# Patient Record
Sex: Female | Born: 1950 | Race: White | Hispanic: No | Marital: Single | Smoking: Never smoker
Health system: Southern US, Community
[De-identification: ages and names within clinical notes are randomized; demographics above are authoritative.]

## PROBLEM LIST (undated history)

## (undated) DIAGNOSIS — E669 Obesity, unspecified: Secondary | ICD-10-CM

## (undated) DIAGNOSIS — I700 Atherosclerosis of aorta: Secondary | ICD-10-CM

## (undated) DIAGNOSIS — Z8709 Personal history of other diseases of the respiratory system: Secondary | ICD-10-CM

## (undated) DIAGNOSIS — K429 Umbilical hernia without obstruction or gangrene: Secondary | ICD-10-CM

## (undated) DIAGNOSIS — K648 Other hemorrhoids: Secondary | ICD-10-CM

## (undated) DIAGNOSIS — H269 Unspecified cataract: Secondary | ICD-10-CM

## (undated) DIAGNOSIS — M51369 Other intervertebral disc degeneration, lumbar region without mention of lumbar back pain or lower extremity pain: Secondary | ICD-10-CM

## (undated) DIAGNOSIS — J45909 Unspecified asthma, uncomplicated: Secondary | ICD-10-CM

## (undated) DIAGNOSIS — F32A Depression, unspecified: Secondary | ICD-10-CM

## (undated) DIAGNOSIS — C73 Malignant neoplasm of thyroid gland: Secondary | ICD-10-CM

## (undated) DIAGNOSIS — K219 Gastro-esophageal reflux disease without esophagitis: Secondary | ICD-10-CM

## (undated) DIAGNOSIS — R42 Dizziness and giddiness: Secondary | ICD-10-CM

## (undated) DIAGNOSIS — M26609 Unspecified temporomandibular joint disorder, unspecified side: Secondary | ICD-10-CM

## (undated) DIAGNOSIS — M609 Myositis, unspecified: Secondary | ICD-10-CM

## (undated) DIAGNOSIS — F419 Anxiety disorder, unspecified: Secondary | ICD-10-CM

## (undated) DIAGNOSIS — I10 Essential (primary) hypertension: Secondary | ICD-10-CM

## (undated) DIAGNOSIS — F329 Major depressive disorder, single episode, unspecified: Secondary | ICD-10-CM

## (undated) DIAGNOSIS — M5136 Other intervertebral disc degeneration, lumbar region: Secondary | ICD-10-CM

## (undated) DIAGNOSIS — K649 Unspecified hemorrhoids: Secondary | ICD-10-CM

## (undated) DIAGNOSIS — M1990 Unspecified osteoarthritis, unspecified site: Secondary | ICD-10-CM

## (undated) DIAGNOSIS — J302 Other seasonal allergic rhinitis: Secondary | ICD-10-CM

## (undated) DIAGNOSIS — K5730 Diverticulosis of large intestine without perforation or abscess without bleeding: Secondary | ICD-10-CM

## (undated) DIAGNOSIS — E785 Hyperlipidemia, unspecified: Secondary | ICD-10-CM

## (undated) DIAGNOSIS — E039 Hypothyroidism, unspecified: Secondary | ICD-10-CM

## (undated) DIAGNOSIS — K5010 Crohn's disease of large intestine without complications: Secondary | ICD-10-CM

## (undated) DIAGNOSIS — K5792 Diverticulitis of intestine, part unspecified, without perforation or abscess without bleeding: Secondary | ICD-10-CM

## (undated) DIAGNOSIS — K8020 Calculus of gallbladder without cholecystitis without obstruction: Secondary | ICD-10-CM

## (undated) DIAGNOSIS — D126 Benign neoplasm of colon, unspecified: Secondary | ICD-10-CM

## (undated) HISTORY — DX: Other intervertebral disc degeneration, lumbar region: M51.36

## (undated) HISTORY — PX: TUBAL LIGATION: SHX77

## (undated) HISTORY — PX: HEMORRHOID SURGERY: SHX153

## (undated) HISTORY — DX: Hyperlipidemia, unspecified: E78.5

## (undated) HISTORY — DX: Crohn's disease of large intestine without complications: K50.10

## (undated) HISTORY — PX: RECTOCELE REPAIR: SHX761

## (undated) HISTORY — PX: THYROIDECTOMY, PARTIAL: SHX18

## (undated) HISTORY — DX: Benign neoplasm of colon, unspecified: D12.6

## (undated) HISTORY — PX: ABDOMINAL HYSTERECTOMY: SHX81

## (undated) HISTORY — PX: HEMORRHOID BANDING: SHX5850

## (undated) HISTORY — PX: HAND NEUROPLASTY: SHX1729

## (undated) HISTORY — DX: Diverticulitis of intestine, part unspecified, without perforation or abscess without bleeding: K57.92

## (undated) HISTORY — DX: Other hemorrhoids: K64.8

## (undated) HISTORY — PX: HEMORROIDECTOMY: SUR656

## (undated) HISTORY — PX: UPPER GI ENDOSCOPY: SHX6162

## (undated) HISTORY — DX: Malignant neoplasm of thyroid gland: C73

## (undated) HISTORY — PX: APPENDECTOMY: SHX54

## (undated) HISTORY — DX: Other intervertebral disc degeneration, lumbar region without mention of lumbar back pain or lower extremity pain: M51.369

## (undated) HISTORY — DX: Unspecified hemorrhoids: K64.9

## (undated) HISTORY — DX: Diverticulosis of large intestine without perforation or abscess without bleeding: K57.30

## (undated) HISTORY — DX: Hypothyroidism, unspecified: E03.9

---

## 1997-09-24 ENCOUNTER — Ambulatory Visit (HOSPITAL_COMMUNITY): Admission: RE | Admit: 1997-09-24 | Discharge: 1997-09-24 | Payer: Self-pay | Admitting: Obstetrics & Gynecology

## 1997-10-01 ENCOUNTER — Ambulatory Visit (HOSPITAL_COMMUNITY): Admission: RE | Admit: 1997-10-01 | Discharge: 1997-10-01 | Payer: Self-pay | Admitting: Obstetrics & Gynecology

## 1998-07-23 ENCOUNTER — Encounter: Payer: Self-pay | Admitting: Obstetrics & Gynecology

## 1998-07-28 ENCOUNTER — Inpatient Hospital Stay (HOSPITAL_COMMUNITY): Admission: RE | Admit: 1998-07-28 | Discharge: 1998-07-30 | Payer: Self-pay | Admitting: Obstetrics & Gynecology

## 1999-08-10 ENCOUNTER — Encounter: Payer: Self-pay | Admitting: Family Medicine

## 1999-08-10 ENCOUNTER — Encounter: Admission: RE | Admit: 1999-08-10 | Discharge: 1999-08-10 | Payer: Self-pay | Admitting: Family Medicine

## 1999-08-18 ENCOUNTER — Other Ambulatory Visit: Admission: RE | Admit: 1999-08-18 | Discharge: 1999-08-18 | Payer: Self-pay | Admitting: General Surgery

## 1999-09-08 ENCOUNTER — Inpatient Hospital Stay (HOSPITAL_COMMUNITY): Admission: RE | Admit: 1999-09-08 | Discharge: 1999-09-09 | Payer: Self-pay | Admitting: General Surgery

## 1999-09-08 ENCOUNTER — Encounter (INDEPENDENT_AMBULATORY_CARE_PROVIDER_SITE_OTHER): Payer: Self-pay | Admitting: Specialist

## 2000-01-01 ENCOUNTER — Other Ambulatory Visit: Admission: RE | Admit: 2000-01-01 | Discharge: 2000-01-01 | Payer: Self-pay | Admitting: Obstetrics & Gynecology

## 2000-01-20 ENCOUNTER — Other Ambulatory Visit: Admission: RE | Admit: 2000-01-20 | Discharge: 2000-01-20 | Payer: Self-pay | Admitting: Internal Medicine

## 2000-01-20 ENCOUNTER — Encounter (INDEPENDENT_AMBULATORY_CARE_PROVIDER_SITE_OTHER): Payer: Self-pay

## 2000-01-20 DIAGNOSIS — D126 Benign neoplasm of colon, unspecified: Secondary | ICD-10-CM

## 2000-01-20 HISTORY — DX: Benign neoplasm of colon, unspecified: D12.6

## 2000-01-22 ENCOUNTER — Encounter: Payer: Self-pay | Admitting: General Surgery

## 2000-01-22 ENCOUNTER — Encounter: Admission: RE | Admit: 2000-01-22 | Discharge: 2000-01-22 | Payer: Self-pay | Admitting: General Surgery

## 2000-02-15 ENCOUNTER — Other Ambulatory Visit: Admission: RE | Admit: 2000-02-15 | Discharge: 2000-02-15 | Payer: Self-pay | Admitting: Urology

## 2000-02-24 ENCOUNTER — Other Ambulatory Visit: Admission: RE | Admit: 2000-02-24 | Discharge: 2000-02-24 | Payer: Self-pay | Admitting: Urology

## 2000-04-13 ENCOUNTER — Encounter: Payer: Self-pay | Admitting: General Surgery

## 2000-04-13 ENCOUNTER — Ambulatory Visit (HOSPITAL_COMMUNITY): Admission: RE | Admit: 2000-04-13 | Discharge: 2000-04-13 | Payer: Self-pay | Admitting: General Surgery

## 2000-07-20 ENCOUNTER — Encounter: Payer: Self-pay | Admitting: General Surgery

## 2000-07-20 ENCOUNTER — Encounter: Admission: RE | Admit: 2000-07-20 | Discharge: 2000-07-20 | Payer: Self-pay | Admitting: General Surgery

## 2001-01-23 ENCOUNTER — Other Ambulatory Visit: Admission: RE | Admit: 2001-01-23 | Discharge: 2001-01-23 | Payer: Self-pay | Admitting: Obstetrics & Gynecology

## 2001-07-12 ENCOUNTER — Encounter: Admission: RE | Admit: 2001-07-12 | Discharge: 2001-07-12 | Payer: Self-pay | Admitting: Family Medicine

## 2001-07-12 ENCOUNTER — Encounter: Payer: Self-pay | Admitting: Family Medicine

## 2001-07-13 ENCOUNTER — Encounter: Payer: Self-pay | Admitting: Gastroenterology

## 2001-07-13 ENCOUNTER — Inpatient Hospital Stay (HOSPITAL_COMMUNITY): Admission: EM | Admit: 2001-07-13 | Discharge: 2001-07-19 | Payer: Self-pay | Admitting: Emergency Medicine

## 2001-07-19 ENCOUNTER — Encounter: Payer: Self-pay | Admitting: Gastroenterology

## 2001-08-03 ENCOUNTER — Encounter: Payer: Self-pay | Admitting: Internal Medicine

## 2001-08-03 ENCOUNTER — Ambulatory Visit (HOSPITAL_COMMUNITY): Admission: RE | Admit: 2001-08-03 | Discharge: 2001-08-03 | Payer: Self-pay | Admitting: Internal Medicine

## 2002-02-28 ENCOUNTER — Other Ambulatory Visit: Admission: RE | Admit: 2002-02-28 | Discharge: 2002-02-28 | Payer: Self-pay | Admitting: Obstetrics & Gynecology

## 2002-03-23 ENCOUNTER — Encounter: Admission: RE | Admit: 2002-03-23 | Discharge: 2002-03-23 | Payer: Self-pay | Admitting: Family Medicine

## 2002-03-23 ENCOUNTER — Encounter: Payer: Self-pay | Admitting: Family Medicine

## 2002-04-18 ENCOUNTER — Encounter: Admission: RE | Admit: 2002-04-18 | Discharge: 2002-04-18 | Payer: Self-pay | Admitting: General Surgery

## 2002-04-18 ENCOUNTER — Encounter: Payer: Self-pay | Admitting: General Surgery

## 2002-04-18 ENCOUNTER — Inpatient Hospital Stay (HOSPITAL_COMMUNITY): Admission: EM | Admit: 2002-04-18 | Discharge: 2002-04-20 | Payer: Self-pay | Admitting: Emergency Medicine

## 2002-04-19 ENCOUNTER — Encounter (INDEPENDENT_AMBULATORY_CARE_PROVIDER_SITE_OTHER): Payer: Self-pay | Admitting: Specialist

## 2002-04-19 ENCOUNTER — Encounter: Payer: Self-pay | Admitting: Surgery

## 2002-10-22 ENCOUNTER — Encounter: Payer: Self-pay | Admitting: General Surgery

## 2002-10-22 ENCOUNTER — Encounter: Admission: RE | Admit: 2002-10-22 | Discharge: 2002-10-22 | Payer: Self-pay | Admitting: General Surgery

## 2003-02-25 ENCOUNTER — Other Ambulatory Visit: Admission: RE | Admit: 2003-02-25 | Discharge: 2003-02-25 | Payer: Self-pay | Admitting: Obstetrics & Gynecology

## 2004-03-31 ENCOUNTER — Ambulatory Visit (HOSPITAL_COMMUNITY): Admission: RE | Admit: 2004-03-31 | Discharge: 2004-03-31 | Payer: Self-pay | Admitting: General Surgery

## 2004-05-07 ENCOUNTER — Observation Stay (HOSPITAL_COMMUNITY): Admission: RE | Admit: 2004-05-07 | Discharge: 2004-05-08 | Payer: Self-pay | Admitting: Obstetrics & Gynecology

## 2004-08-04 ENCOUNTER — Other Ambulatory Visit: Admission: RE | Admit: 2004-08-04 | Discharge: 2004-08-04 | Payer: Self-pay | Admitting: Obstetrics & Gynecology

## 2004-10-12 ENCOUNTER — Ambulatory Visit: Payer: Self-pay | Admitting: Internal Medicine

## 2004-10-13 ENCOUNTER — Encounter: Admission: RE | Admit: 2004-10-13 | Discharge: 2004-10-13 | Payer: Self-pay | Admitting: Surgery

## 2004-10-28 ENCOUNTER — Ambulatory Visit: Payer: Self-pay | Admitting: Internal Medicine

## 2005-08-11 ENCOUNTER — Other Ambulatory Visit: Admission: RE | Admit: 2005-08-11 | Discharge: 2005-08-11 | Payer: Self-pay | Admitting: Obstetrics & Gynecology

## 2005-08-31 ENCOUNTER — Encounter: Admission: RE | Admit: 2005-08-31 | Discharge: 2005-08-31 | Payer: Self-pay | Admitting: Obstetrics & Gynecology

## 2006-04-28 ENCOUNTER — Encounter: Admission: RE | Admit: 2006-04-28 | Discharge: 2006-07-27 | Payer: Self-pay | Admitting: Family Medicine

## 2006-10-27 ENCOUNTER — Encounter: Admission: RE | Admit: 2006-10-27 | Discharge: 2006-10-27 | Payer: Self-pay | Admitting: Obstetrics & Gynecology

## 2007-04-12 ENCOUNTER — Encounter: Admission: RE | Admit: 2007-04-12 | Discharge: 2007-04-12 | Payer: Self-pay | Admitting: General Surgery

## 2007-05-16 ENCOUNTER — Ambulatory Visit: Payer: Self-pay | Admitting: Cardiovascular Disease

## 2007-06-26 ENCOUNTER — Ambulatory Visit: Payer: Self-pay

## 2007-06-27 ENCOUNTER — Ambulatory Visit: Payer: Self-pay | Admitting: Cardiovascular Disease

## 2007-06-27 LAB — CONVERTED CEMR LAB
AST: 21 units/L (ref 0–37)
Albumin: 3.8 g/dL (ref 3.5–5.2)
Alkaline Phosphatase: 39 units/L (ref 39–117)
Cholesterol: 161 mg/dL (ref 0–200)
HDL: 35.6 mg/dL — ABNORMAL LOW (ref >39.0)
Total Bilirubin: 0.9 mg/dL (ref 0.3–1.2)
Total CHOL/HDL Ratio: 4.5
Triglycerides: 168 mg/dL — ABNORMAL HIGH (ref 0–149)

## 2007-10-30 ENCOUNTER — Encounter: Admission: RE | Admit: 2007-10-30 | Discharge: 2007-10-30 | Payer: Self-pay | Admitting: Obstetrics & Gynecology

## 2008-06-03 ENCOUNTER — Ambulatory Visit (HOSPITAL_COMMUNITY): Admission: RE | Admit: 2008-06-03 | Discharge: 2008-06-03 | Payer: Self-pay | Admitting: Specialist

## 2008-11-28 ENCOUNTER — Emergency Department (HOSPITAL_COMMUNITY): Admission: EM | Admit: 2008-11-28 | Discharge: 2008-11-28 | Payer: Self-pay | Admitting: Emergency Medicine

## 2009-05-21 ENCOUNTER — Telehealth: Payer: Self-pay | Admitting: Internal Medicine

## 2009-05-22 ENCOUNTER — Telehealth (INDEPENDENT_AMBULATORY_CARE_PROVIDER_SITE_OTHER): Payer: Self-pay | Admitting: *Deleted

## 2009-06-02 ENCOUNTER — Ambulatory Visit (HOSPITAL_COMMUNITY): Admission: RE | Admit: 2009-06-02 | Discharge: 2009-06-02 | Payer: Self-pay | Admitting: Family Medicine

## 2009-07-14 ENCOUNTER — Telehealth: Payer: Self-pay | Admitting: Internal Medicine

## 2009-07-15 ENCOUNTER — Encounter: Payer: Self-pay | Admitting: Gastroenterology

## 2009-08-01 DIAGNOSIS — E039 Hypothyroidism, unspecified: Secondary | ICD-10-CM | POA: Insufficient documentation

## 2009-08-01 DIAGNOSIS — F341 Dysthymic disorder: Secondary | ICD-10-CM | POA: Insufficient documentation

## 2009-08-01 DIAGNOSIS — I10 Essential (primary) hypertension: Secondary | ICD-10-CM | POA: Insufficient documentation

## 2009-08-01 DIAGNOSIS — E7800 Pure hypercholesterolemia, unspecified: Secondary | ICD-10-CM

## 2009-08-05 ENCOUNTER — Ambulatory Visit: Payer: Self-pay | Admitting: Gastroenterology

## 2009-08-05 DIAGNOSIS — D126 Benign neoplasm of colon, unspecified: Secondary | ICD-10-CM

## 2009-08-05 DIAGNOSIS — R1084 Generalized abdominal pain: Secondary | ICD-10-CM

## 2009-08-05 DIAGNOSIS — K5730 Diverticulosis of large intestine without perforation or abscess without bleeding: Secondary | ICD-10-CM | POA: Insufficient documentation

## 2009-08-05 LAB — CONVERTED CEMR LAB
Amylase: 43 units/L (ref 27–131)
IgA: 413 mg/dL — ABNORMAL HIGH (ref 68–378)
Lipase: 17 units/L (ref 11.0–59.0)
Sed Rate: 39 mm/hr — ABNORMAL HIGH (ref 0–22)
Tissue Transglutaminase Ab, IgA: 0.9 units (ref <7)

## 2009-08-26 ENCOUNTER — Ambulatory Visit: Payer: Self-pay | Admitting: Gastroenterology

## 2009-08-26 ENCOUNTER — Encounter (INDEPENDENT_AMBULATORY_CARE_PROVIDER_SITE_OTHER): Payer: Self-pay | Admitting: *Deleted

## 2009-08-26 DIAGNOSIS — R1012 Left upper quadrant pain: Secondary | ICD-10-CM

## 2009-08-26 LAB — CONVERTED CEMR LAB
AST: 18 units/L (ref 0–37)
Albumin: 4 g/dL (ref 3.5–5.2)
CO2: 30 meq/L (ref 19–32)
Ferritin: 94.4 ng/mL (ref 10.0–291.0)
Folate: 7.7 ng/mL
Glucose, Bld: 88 mg/dL (ref 70–99)
Iron: 55 ug/dL (ref 42–145)
Lymphocytes Relative: 29 % (ref 12.0–46.0)
MCHC: 32.6 g/dL (ref 30.0–36.0)
MCV: 90.7 fL (ref 78.0–100.0)
Neutrophils Relative %: 63.4 % (ref 43.0–77.0)
Potassium: 3.6 meq/L (ref 3.5–5.1)
RDW: 11.5 % (ref 11.5–14.6)
Sed Rate: 43 mm/hr — ABNORMAL HIGH (ref 0–22)
Sodium: 139 meq/L (ref 135–145)
TSH: 2.13 microintl units/mL (ref 0.35–5.50)
Total Protein: 8 g/dL (ref 6.0–8.3)
Vitamin B-12: 346 pg/mL (ref 211–911)

## 2009-08-27 ENCOUNTER — Telehealth: Payer: Self-pay | Admitting: Gastroenterology

## 2009-09-10 ENCOUNTER — Ambulatory Visit (HOSPITAL_COMMUNITY)
Admission: RE | Admit: 2009-09-10 | Discharge: 2009-09-10 | Payer: Self-pay | Source: Home / Self Care | Admitting: Gastroenterology

## 2009-09-10 ENCOUNTER — Ambulatory Visit: Payer: Self-pay | Admitting: Gastroenterology

## 2009-09-10 DIAGNOSIS — K219 Gastro-esophageal reflux disease without esophagitis: Secondary | ICD-10-CM

## 2009-09-11 LAB — CONVERTED CEMR LAB: UREASE: NEGATIVE

## 2009-09-12 ENCOUNTER — Encounter: Payer: Self-pay | Admitting: Gastroenterology

## 2009-10-28 ENCOUNTER — Encounter (INDEPENDENT_AMBULATORY_CARE_PROVIDER_SITE_OTHER): Payer: Self-pay | Admitting: *Deleted

## 2009-10-28 ENCOUNTER — Ambulatory Visit: Payer: Self-pay | Admitting: Gastroenterology

## 2009-11-05 ENCOUNTER — Ambulatory Visit: Payer: Self-pay | Admitting: Gastroenterology

## 2010-07-26 ENCOUNTER — Encounter: Payer: Self-pay | Admitting: Surgery

## 2010-07-27 ENCOUNTER — Encounter: Payer: Self-pay | Admitting: Orthopedic Surgery

## 2010-07-28 ENCOUNTER — Encounter: Payer: Self-pay | Admitting: Gastroenterology

## 2010-08-06 NOTE — Letter (Signed)
Summary: EGD Instructions  [facility]  [address] [place], [place] [address]   Phone: [phone]  Fax: [phone]       Linda Wang    01/30/1951    MRN: [id number]       Procedure Day Dorna Bloom: Wednesday, 09/10/09     Arrival Time:  10:00     Procedure Time: 11:00     Location of Procedure:                    _X  _ [facility] (4th Floor)     PREPARATION FOR ENDOSCOPY   On 09/10/09 THE DAY OF THE PROCEDURE:  1.   No solid foods, milk or milk products are allowed after midnight the night before your procedure.  2.   Do not drink anything colored red or purple.  Avoid juices with pulp.  No orange juice.  3.  You may drink clear liquids until 9:00, which is 2 hours before your procedure.                                                                                                CLEAR LIQUIDS INCLUDE: Water Jello Ice Popsicles Tea (sugar ok, no milk/cream) Powdered fruit flavored drinks Coffee (sugar ok, no milk/cream) Gatorade Juice: apple, white grape, white cranberry  Lemonade Clear bullion, consomm, broth Carbonated beverages (any kind) Strained chicken noodle soup Hard Candy   MEDICATION INSTRUCTIONS  Unless otherwise instructed, you should take regular prescription medications with a small sip of water as early as possible the morning of your procedure.                       OTHER INSTRUCTIONS  You will need a responsible adult at least 60 years of age to accompany you and drive you home.   This person must remain in the waiting room during your procedure.  Wear loose fitting clothing that is easily removed.  Leave jewelry and other valuables at home.  However, you may wish to bring a book to read or an iPod/MP3 player to listen to music as you wait for your procedure to start.  Remove all body piercing jewelry and leave at home.  Total time from sign-in until discharge is approximately 2-3 hours.  You should  go home directly after your procedure and rest.  You can resume normal activities the day after your procedure.  The day of your procedure you should not:   Drive   Make legal decisions   Operate machinery   Drink alcohol   Return to work  You will receive specific instructions about eating, activities and medications before you leave.    The above instructions have been reviewed and explained to me by   _______________________    I fully understand and can verbalize these instructions _____________________________ Date _________

## 2010-08-06 NOTE — Miscellaneous (Signed)
Summary: Clotest  Clinical Lists Changes  Orders: Added new Test order of TLB-H Pylori Screen Gastric Biopsy (83013-CLOTEST) - Signed 

## 2010-08-06 NOTE — Progress Notes (Signed)
Summary: change meds  Medications Added HYOSCYAMINE SULFATE 0.125 MG TABS (HYOSCYAMINE SULFATE) 1 q 4-6 hrs as needed       Phone Note Call from Patient Call back at [phone]   Caller: Patient Call For: Dr. Jarold Motto Reason for Call: Talk to Nurse Summary of Call: pt would like the generic of the med that was called infor her yesterday... pt doesnt recall name, but said that it was for stomach cramping... Initial call taken by: Vallarie Mare,  August 27, 2009 3:20 PM  Follow-up for Phone Call        Pt notified. Follow-up by: Ashok Cordia RN,  August 27, 2009 3:55 PM    New/Updated Medications: HYOSCYAMINE SULFATE 0.125 MG TABS (HYOSCYAMINE SULFATE) 1 q 4-6 hrs as needed Prescriptions: HYOSCYAMINE SULFATE 0.125 MG TABS (HYOSCYAMINE SULFATE) 1 q 4-6 hrs as needed  #40 x 3   Entered by:   Ashok Cordia RN   Authorized by:   Mardella Layman MD [facility]   Signed by:   Ashok Cordia RN on 08/27/2009   Method used:   Electronically to        [facility] [address] (retail)       [phone]       [place], [place]  [address]       Ph: [phone]       Fax: [phone]   RxID:   [id number]   Appended Document: change meds Pt request Rx be sent to [facility] on [address].   Clinical Lists Changes  Medications: Rx of HYOSCYAMINE SULFATE 0.125 MG TABS (HYOSCYAMINE SULFATE) 1 q 4-6 hrs as needed;  #40 x 3;  Signed;  Entered by: Ashok Cordia RN;  Authorized by: Mardella Layman MD [facility];  Method used: Electronically to [facility]*, [address] [place], [place], [place]  [address], Ph: [phone], Fax: [phone]    Prescriptions: HYOSCYAMINE SULFATE 0.125 MG TABS (HYOSCYAMINE SULFATE) 1 q 4-6 hrs as needed  #40 x 3   Entered by:   Ashok Cordia RN   Authorized by:   Mardella Layman MD [facility]   Signed by:   Ashok Cordia RN on 08/28/2009   Method used:   Electronically to        [facility]*  (retail)       [address] [place], [place]  [address]       Ph: [phone]       Fax: [phone]   RxID:   [id number]

## 2010-08-06 NOTE — Assessment & Plan Note (Signed)
Summary: Post proc, 6 weeks/dfs    History of Present Illness Visit Type: Follow-up Visit Primary GI MD: Sheryn Bison MD FACP FAGA Primary Provider: [name], MD Chief Complaint: F/u from Endo and [place]. Patient denies any current GI complaints.  History of Present Illness:   Self imposed dietary restrictions have alleviated all of her GI complaints. She specifically denies left upper quadrant pain or reflux symptoms. She took AcipHex for 2 weeks has been off of this for the last month.   GI Review of Systems      Denies abdominal pain, acid reflux, belching, bloating, chest pain, dysphagia with liquids, dysphagia with solids, heartburn, loss of appetite, nausea, vomiting, vomiting blood, weight loss, and  weight gain.        Denies anal fissure, black tarry stools, change in bowel habit, constipation, diarrhea, diverticulosis, fecal incontinence, heme positive stool, hemorrhoids, irritable bowel syndrome, jaundice, light color stool, liver problems, rectal bleeding, and  rectal pain.    Current Medications (verified): 1)  Clearlax  Powd (Polyethylene Glycol 3350) .... Take 1 Dose Daily 2)  Levothroid 125 Mcg Tabs (Levothyroxine Sodium) .... Take 3 Tablets Weekly 3)  Vivelle-Dot 0.075 Mg/24hr Pttw (Estradiol) .... Change Patch Every 3-4 Days 4)  Alprazolam 0.5 Mg Tabs (Alprazolam) .... Take 1 Tablet By Mouth Once A Day As Needed 5)  Simvastatin 40 Mg Tabs (Simvastatin) .... Take 1 Tablet By Mouth Once A Day  Allergies (verified): 1)  Sulfa  Past History:  Past medical, surgical, family and social histories (including risk factors) reviewed for relevance to current acute and chronic problems.  Past Medical History: Reviewed history from 08/01/2009 and no changes required. Current Problems:  HYPOTHYROIDISM (ICD-244.9) ANXIETY DEPRESSION (ICD-300.4) HYPERTENSION (ICD-401.9) HYPERCHOLESTEROLEMIA (ICD-272.0)  Past Surgical  History: Appendectomy Hemorrhoidectomy Hysterectomy Thyroid Surgery Tubal Ligation C Section Laparoscopic rectocele surgery  Family History: Reviewed history from 08/05/2009 and no changes required. 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  Alcohol Use - no Illicit Drug Use - no  Review of Systems       The patient complains of allergy/sinus.  The patient denies anemia, anxiety-new, arthritis/joint pain, back pain, blood in urine, breast changes/lumps, change in vision, confusion, cough, coughing up blood, depression-new, fainting, fatigue, fever, headaches-new, hearing problems, heart murmur, heart rhythm changes, itching, menstrual pain, muscle pains/cramps, night sweats, nosebleeds, pregnancy symptoms, shortness of breath, skin rash, sleeping problems, sore throat, swelling of feet/legs, swollen lymph glands, thirst - excessive , urination - excessive , urination changes/pain, urine leakage, vision changes, and voice change.    Vital Signs:  Patient profile:   60 year old female Height:      62 inches Weight:      159.50 pounds BMI:     29.28 BSA:     1.74 Pulse rate:   84 / minute Pulse rhythm:   regular BP sitting:   144 / 94  (left arm)  Vitals Entered By: Lamona Curl CMA Duncan Dull) (October 28, 2009 3:03 PM)  Physical Exam  General:  Well developed, well nourished, no acute distress. Head:  Normocephalic and atraumatic. Eyes:  PERRLA, no icterus.exam deferred to patient's ophthalmologist.   Abdomen:  Soft, nontender and nondistended. No masses, hepatosplenomegaly or hernias noted. Normal bowel  sounds. Psych:  Alert and cooperative. Normal mood and affect.   Impression &  Recommendations:  Problem # 1:  ABDOMINAL PAIN, GENERALIZED (ICD-789.07) Assessment Improved  Problem # 2:  ADENOMATOUS COLONIC POLYP (ICD-211.3) Assessment: Unchanged Followup colonoscopy scheduled her convenience. It has been 5 years since her last exam and she does have a history of adenomatous polyps and a positive family history of colon cancer. She currently symptomatic in terms of any gastrointestinal abnormalities.  Patient Instructions: 1)  Copy sent to : Dr. [name] 2)  Please continue current medications.  3)  Colonoscopy and Flexible Sigmoidoscopy brochure given.  4)  Conscious Sedation brochure given.   Appended Document: Post proc, 6 weeks/dfs    Clinical Lists Changes  Medications: Added new medication of MOVIPREP 100 GM  SOLR (PEG-KCL-NACL-NASULF-NA ASC-C) As per prep instructions. - Signed Rx of MOVIPREP 100 GM  SOLR (PEG-KCL-NACL-NASULF-NA ASC-C) As per prep instructions.;  #1 x 0;  Signed;  Entered by: Ashok Cordia RN;  Authorized by: Mardella Layman MD [facility];  Method used: Electronically to [facility] [address] [phone] [place], [place], [place]  [address], Ph: [phone], Fax: [phone] Orders: Added new Test order of Colonoscopy (Colon) - Signed    Prescriptions: MOVIPREP 100 GM  SOLR (PEG-KCL-NACL-NASULF-NA ASC-C) As per prep instructions.  #1 x 0   Entered by:   Ashok Cordia RN   Authorized by:   Mardella Layman MD [facility]   Signed by:   Ashok Cordia RN on 10/28/2009   Method used:   Electronically to        [facility] [address] (retail)       [phone]       [place], [place]  [address]       Ph: [phone]       Fax: [phone]   RxID:   [id number]

## 2010-08-06 NOTE — Procedures (Signed)
Summary: Colonoscopy  Patient: Linda Wang Note: All result statuses are Final unless otherwise noted.  Tests: (1) Colonoscopy (COL)   COL Colonoscopy           DONE     [facility]     [address]     [place], [place]  [address]           COLONOSCOPY PROCEDURE REPORT           PATIENT:  Linda, Wang  MR#:  [id number]     BIRTHDATE:  04/13/1951, 58 yrs. old  GENDER:  female     ENDOSCOPIST:  Vania Rea. Jarold Motto, MD, [facility]     REF. BY:     PROCEDURE DATE:  11/05/2009     PROCEDURE:  Higher-risk screening colonoscopy G0105           ASA CLASS:  Class II     INDICATIONS:  family history of colon cancer, history of     pre-cancerous (adenomatous) colon polyps     MEDICATIONS:   Fentanyl 75 mcg IV, Versed 8 mg IV           DESCRIPTION OF PROCEDURE:   After the risks benefits and     alternatives of the procedure were thoroughly explained, informed     consent was obtained.  Digital rectal exam was performed and     revealed tender.   The LB CF-H180AL [id number] endoscope was     introduced through the anus and advanced to the cecum, which was     identified by both the appendix and ileocecal valve, without     limitations.  The quality of the prep was good, using MoviPrep.     The instrument was then slowly withdrawn as the colon was fully     examined.<<PROCEDUREIMAGES>>           FINDINGS:  Severe diverticulosis was found in the sigmoid to     descending colon segments. THICKENED,RED HAUSTRAL FOLDS NOTED.  No     polyps or cancers were seen.  Internal and external hemorrhoids     were found. SEE PICTURES.   Retroflexed views in the rectum     revealed internal and external hemorrhoids.    The scope was then     withdrawn from the patient and the procedure completed.           COMPLICATIONS:  None     ENDOSCOPIC IMPRESSION:     1) Severe diverticulosis in the sigmoid to descending colon     segments     2) No polyps or cancers     3) Internal and external hemorrhoids  RECOMMENDATIONS:     1) high fiber diet     2) Given your significant family history of colon cancer, you     should have a repeat colonoscopy in 5 years     REPEAT EXAM:  No           ______________________________     Vania Rea. Jarold Motto, MD, [facility]           CC:  [name], MD           n.     Rosalie DoctorMarland Kitchen   Vania Rea. Patterson at 11/05/2009 03:08 PM           [place], [place], [id number]  Note: An exclamation mark (!) indicates a result that was not dispersed into the flowsheet. Document Creation Date: 11/05/2009 3:09 PM _______________________________________________________________________  (1) Order  result status: Final Collection or observation date-time: 11/05/2009 15:02 Requested date-time:  Receipt date-time:  Reported date-time:  Referring Physician:   Ordering Physician: Sheryn Bison [phone]) Specimen Source:  Source: Launa Grill Order Number: [id number] Lab site:   Appended Document: Colonoscopy     Procedures Next Due Date:    Colonoscopy: 11/2014

## 2010-08-06 NOTE — Letter (Signed)
Summary: New Patient letter  [facility]  [address] [place], [place] [address]   Phone: [phone]  Fax: [phone]       07/15/2009 MRN: [id number]  [facility] [address] [place], [place]  [address]  Dear Ms. Netherton,  Welcome to the Gastroenterology Division at [facility].    You are scheduled to see Dr.  Sheryn Bison on 08-05-09 at 2pm,  on the 3rd floor at [facility], [address]  We ask that you try to arrive at our office 15 minutes prior to your appointment time to allow for check-in.  We would like you to complete the enclosed self-administered evaluation form prior to your visit and bring it with you on the day of your appointment.  We will review it with you.  Also, please bring a complete list of all your medications or, if you prefer, bring the medication bottles and we will list them.  Please bring your insurance card so that we may make a copy of it.  If your insurance requires a referral to see a specialist, please bring your referral form from your primary care physician.  Co-payments are due at the time of your visit and may be paid by cash, check or credit card.     Your office visit will consist of a consult with your physician (includes a physical exam), any laboratory testing he/she may order, scheduling of any necessary diagnostic testing (e.g. x-ray, ultrasound, CT-scan), and scheduling of a procedure (e.g. Endoscopy, Colonoscopy) if required.  Please allow enough time on your schedule to allow for any/all of these possibilities.    If you cannot keep your appointment, please call [phone] to cancel or reschedule prior to your appointment date.  This allows [place] the opportunity to schedule an appointment for another patient in need of care.  If you do not cancel or reschedule by 5 p.m. the business day prior to your appointment date, you will be charged a $50.00 late cancellation/no-show fee.    Thank you for choosing [facility] for your medical needs.  We appreciate the opportunity to care for you.  Please visit [place] at our website  to learn more about our practice.                     Sincerely,                                                             The Gastroenterology Division   Appended Document: New Patient letter Letter mailed to patient.

## 2010-08-06 NOTE — Procedures (Signed)
Summary: Colon   Colonoscopy  Procedure date:  10/28/2004  Findings:      Location:  [facility].   Patient Name: Linda, Wang MRN:  Procedure Procedures: Colonoscopy CPT: [id number].  Personnel: Endoscopist: Wilhemina Bonito. Marina Goodell, MD.  Exam Location: Exam performed in Outpatient Clinic. Outpatient  Patient Consent: Procedure, Alternatives, Risks and Benefits discussed, consent obtained, from patient. Consent was obtained by the RN.  Indications  Surveillance of: Adenomatous Polyp(s). This is not an initial surveillance exam. Initial polypectomy was performed in 2001. Pathology of worst  polyp: tubular adenoma. Previous surveillance exam(s) in  2003,  History  Current Medications: Patient is not currently taking Coumadin.  Pre-Exam Physical: Performed Oct 28, 2004. Entire physical exam was normal.  Exam Exam: Extent of exam reached: Cecum, extent intended: Cecum.  The cecum was identified by appendiceal orifice and IC valve. Patient position: on left side. Colon retroflexion performed. Images taken. ASA Classification: II. Tolerance: excellent.  Monitoring: Pulse and BP monitoring, Oximetry used. Supplemental O2 given.  Colon Prep Used MIRALAX for colon prep. Prep results: excellent.  Sedation Meds: Patient assessed and found to be appropriate for moderate (conscious) sedation. Fentanyl 125 mcg. given IV. Versed 12 given IV.  Findings NORMAL EXAM: Cecum to Rectum.  - DIVERTICULOSIS: Ascending Colon to Sigmoid Colon. ICD9: Diverticulosis, Colon: 562.10.    Comments: NO POLYPS SEEN Assessment Abnormal examination, see findings above.  Diagnoses: 562.10: Diverticulosis, Colon.  455.0: Hemorrhoids, Internal.   Events  Unplanned Interventions: No intervention was required.  Unplanned Events: There were no complications. Plans Disposition: After procedure patient sent to recovery. After recovery patient sent home.  Scheduling/Referral: Colonoscopy, to  Wilhemina Bonito. Marina Goodell, MD, IN 5 YEARS (GIVEN PRIOR HISTORY OF POLYPS),    cc: [name], MD     [name], MD     [name]   This report was created from the original endoscopy report, which was reviewed and signed by the above listed endoscopist.

## 2010-08-06 NOTE — Progress Notes (Signed)
Summary: switch Docs   Phone Note Call from Patient Call back at [facility] Phone [phone]   Caller: Patient Call For: Marina Goodell Reason for Call: Talk to Nurse Summary of Call: Patient wants to switch from Dr Marina Goodell to Dr Jarold Motto because all her family goes to Dr Jarold Motto and she wants to [facility] care with him nstead of Dr Marina Goodell Initial call taken by: Tawni Levy [facility],  July 14, 2009 3:20 PM  Follow-up for Phone Call        SURE. Follow-up by: Hilarie Fredrickson MD,  July 14, 2009 7:09 PM  Additional Follow-up for Phone Call Additional follow up Details #1::        DR.PATTERSON--Will you accept this patient? Laureen Ochs LPN  July 15, 2009 10:20 AM     Additional Follow-up for Phone Call Additional follow up Details #2::    ok Follow-up by: Mardella Layman MD [facility],  July 15, 2009 11:10 AM  Additional Follow-up for Phone Call Additional follow up Details #3:: Details for Additional Follow-up Action Taken: Pt. will see Dr.Patterson on 08-15-09 at 2pm. Pt. instructed to call back as needed.  Additional Follow-up by: Laureen Ochs LPN,  July 15, 2009 11:46 AM

## 2010-08-06 NOTE — Procedures (Signed)
Summary: Upper Endoscopy  Patient: Linda Wang Note: All result statuses are Final unless otherwise noted.  Tests: (1) Upper Endoscopy (EGD)   EGD Upper Endoscopy       DONE     [facility]     [address]     [place], [place]  [address]           ENDOSCOPY PROCEDURE REPORT           PATIENT:  Linda, Wang  MR#:  [id number]     BIRTHDATE:  March 31, 1951, 58 yrs. old  GENDER:  female           ENDOSCOPIST:  Vania Rea. Jarold Motto, MD, [facility]     Referred by:           PROCEDURE DATE:  09/10/2009     PROCEDURE:  EGD with biopsy     ASA CLASS:  Class II     INDICATIONS:  abdominal pain despite treatment           MEDICATIONS:   Fentanyl 75 mcg IV, Versed 9 mg IV, glycopyrrolate     (Robinal) 0.2 mg IV     TOPICAL ANESTHETIC:  Exactacain Spray           DESCRIPTION OF PROCEDURE:   After the risks benefits and     alternatives of the procedure were thoroughly explained, informed     consent was obtained.  The LB GIF-H180 [id number] endoscope was     introduced through the mouth and advanced to the second portion of     the duodenum, without limitations.  The instrument was slowly     withdrawn as the mucosa was fully examined.     <<PROCEDUREIMAGES>>           Esophagitis was found. DISTAL EROSIONS.RANDOM ESOPHAGEAL BIOPSIES     DONE.  Normal duodenal folds were noted.  The stomach was entered     and closely examined. The antrum, angularis, and lesser curvature     were well visualized, including a retroflexed view of the cardia     and fundus. The stomach wall was normally distensable. The scope     passed easily through the pylorus into the duodenum. CLO BX. DONE.     Retroflexed views revealed no abnormalities.    The scope was then     withdrawn from the patient and the procedure completed.           COMPLICATIONS:  None           ENDOSCOPIC IMPRESSION:     1) Esophagitis     2) Normal duodenal folds     3) Normal stomach     CHRONIC GERD.R/O EOSINOPHILIC ESOPHAGITIS.    RECOMMENDATIONS:     1) Anti-reflux regimen to be follow     2) Await biopsy results     ACIPHEX 20 MD/QAM FOR 6 WEEKS.THEN OFFICE F/U           REPEAT EXAM:  No           ______________________________     Vania Rea. Jarold Motto, MD, [facility]           CC:  [name], MD           n.     Rosalie DoctorMarland Kitchen   Vania Rea. Shauntea Lok at 09/10/2009 11:30 AM           [place], [place], [phone]  Note: An exclamation mark (!) indicates a result that was not  dispersed into the flowsheet. Document Creation Date: 09/10/2009 11:30 AM _______________________________________________________________________  (1) Order result status: Final Collection or observation date-time: 09/10/2009 11:23 Requested date-time:  Receipt date-time:  Reported date-time:  Referring Physician:   Ordering Physician: Sheryn Bison [id number]) Specimen Source:  Source: Launa Grill Order Number: [id number] Lab site:

## 2010-08-06 NOTE — Assessment & Plan Note (Signed)
Summary: TO ESTABLISH W/DR.Jarold Motto     (MD SWITCH APPROVED)    DEBORAH    History of Present Illness Visit Type: new patient Primary GI MD: Sheryn Bison MD FACP FAGA Primary Provider: [name], MD Chief Complaint: pt is here to establish with Dr. Jarold Motto. She states she is not really having problems other than some belching History of Present Illness:   60 year old Caucasian female with chronic diverticulosis, previous appendectomy following colonoscopy in 2003, lysis of adhesions laparoscopically with rectocele repair in 2005 who now presents with 4-6 months of abdominal bloating, generalized discomfort, and mild bowel irregularity without melena or hematochezia, upper gastrointestinal or hepatobiliary complaints. She denies anorexia, weight loss, or systemic complaints. She has a lot of burping but denies true reflux symptoms. She has chronic lower dysfunction hyperlipidemia and is on appropriate therapy per Dr. [name]. Last colonoscopy exam was by Dr. Wendie Agreste 4 years ago and showed diverticulosis. Recurrent abdominal pain is 2-4/10 it does not awaken her from sleep and has no relationship to meals, but is removed slightly by having a soft bowel movement. Family history is remarkable for colon polyps and her 2 brothers.   GI Review of Systems    Reports abdominal pain, belching, bloating, and  nausea.      Denies acid reflux, chest pain, dysphagia with liquids, dysphagia with solids, heartburn, loss of appetite, vomiting, vomiting blood, weight loss, and  weight gain.      Reports constipation and  diverticulosis.     Denies anal fissure, black tarry stools, change in bowel habit, diarrhea, fecal incontinence, heme positive stool, hemorrhoids, irritable bowel syndrome, jaundice, light color stool, liver problems, rectal bleeding, and  rectal pain.    Current Medications (verified): 1)  Mucinex 600 Mg Xr12h-Tab (Guaifenesin) .... Take 1 Tablet By Mouth Two Times A Day 2)   Clearlax  Powd (Polyethylene Glycol 3350) .... Take 1 Dose Daily 3)  Levothroid 125 Mcg Tabs (Levothyroxine Sodium) .... Take 3 Tablets Weekly 4)  Vivelle-Dot 0.075 Mg/24hr Pttw (Estradiol) .... Change Patch Every 3-4 Days 5)  Alprazolam 0.5 Mg Tabs (Alprazolam) .... Take 1 Tablet By Mouth Once A Day As Needed 6)  Simvastatin 40 Mg Tabs (Simvastatin) .... Take 1 Tablet By Mouth Once A Day  Allergies (verified): 1)  Sulfa  Past History:  Past medical, surgical, family and social histories (including risk factors) reviewed for relevance to current acute and chronic problems.  Past Medical History: Reviewed history from 08/01/2009 and no changes required. Current Problems:  HYPOTHYROIDISM (ICD-244.9) ANXIETY DEPRESSION (ICD-300.4) HYPERTENSION (ICD-401.9) HYPERCHOLESTEROLEMIA (ICD-272.0)  Past Surgical History: Reviewed history from 08/01/2009 and no changes required. Appendectomy Hemorrhoidectomy Hysterectomy Thyroid Surgery Tubal Ligation C Section Laparoscopic rectocele surgery  Family History: Reviewed history from 08/01/2009 and no changes required. Family History of Heart Disease: Mother No FH of Colon Cancer: Family History of Prostate Cancer: Brother x 2  Family History of Colon Polyps: Brother x 2 Family History of Diabetes: Mother  Social History: Reviewed history and no changes required. 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  Alcohol Use - no Illicit Drug Use - no  Review of Systems       The patient complains of anxiety-new, arthritis/joint pain, back pain, and itching.  The patient denies allergy/sinus, anemia, blood in urine, breast changes/lumps, change in vision, confusion, cough, coughing up blood, depression-new, fainting, fatigue, fever, headaches-new, hearing problems, heart murmur, heart rhythm changes, menstrual pain, muscle pains/cramps, night sweats, nosebleeds, pregnancy symptoms, shortness of breath, skin  rash, sleeping problems, sore  throat, swelling of feet/legs, swollen lymph glands, thirst - excessive , urination - excessive , urination changes/pain, urine leakage, vision changes, and voice change.    Vital Signs:  Patient profile:   60 year old female Height:      62 inches Weight:      162 pounds BMI:     29.74 Pulse rate:   96 / minute Pulse rhythm:   regular BP sitting:   150 / 96  (left arm) Cuff size:   regular  Vitals Entered By: Francee Piccolo CMA Duncan Dull) (August 05, 2009 2:24 PM)  Physical Exam  General:  Well developed, well nourished, no acute distress.obese.   Head:  Normocephalic and atraumatic. Eyes:  PERRLA, no icterus. Neck:  Supple; no masses or thyromegaly. Lungs:  Clear throughout to auscultation. Heart:  Regular rate and rhythm; no murmurs, rubs,  or bruits. Abdomen:  Soft, nontender and nondistended. No masses, hepatosplenomegaly or hernias noted. Normal bowel sounds.No distention masses or tenderness noted. Bowel sounds are normal. Rectal:  Normal exam.hemocult negative.   Msk:  Symmetrical with no gross deformities. Normal posture. Pulses:  Normal pulses noted. Extremities:  No clubbing, cyanosis, edema or deformities noted. Neurologic:  Alert and  oriented x4;  grossly normal neurologically. Skin:  Intact without significant lesions or rashes. Cervical Nodes:  No significant cervical adenopathy. Axillary Nodes:  No significant axillary adenopathy. Inguinal Nodes:  No significant inguinal adenopathy. Psych:  Alert and cooperative. Normal mood and affect.   Impression & Recommendations:  Problem # 1:  DIVERTICULOSIS OF COLON (ICD-562.10) Assessment Unchanged Her chronic diverticulosis seems to be doing well with a high-fiber diet. I am suspicious that she has low grade bacterial overgrowth syndrome and will treat her with Xifaxan 550 mg twice a day for 10 days along with probiotic therapy, check labs, and do office followup in 2-3 weeks' time. She may need colonoscopy followup  dependent on her clinical course. Orders: TLB-Hepatic/Liver Function Pnl (80076-HEPATIC) TLB-Amylase (82150-AMYL) TLB-Lipase (83690-LIPASE) TLB-Sedimentation Rate (ESR) (85652-ESR) TLB-IgA (Immunoglobulin A) (82784-IGA) T-Sprue Panel (Celiac Disease Aby Eval) (83516x3/[address])  Problem # 2:  ABDOMINAL PAIN, GENERALIZED (ICD-789.07) Assessment: Comment Only Continue regular MiraLax therapy and p.r.n. alprazolam 0.5 mg one to 2 times a day which she has been off for some period of time. Orders: TLB-Hepatic/Liver Function Pnl (80076-HEPATIC) TLB-Amylase (82150-AMYL) TLB-Lipase (83690-LIPASE) TLB-Sedimentation Rate (ESR) (85652-ESR) TLB-IgA (Immunoglobulin A) (82784-IGA) T-Sprue Panel (Celiac Disease Aby Eval) (83516x3/[address])  Problem # 3:  ADENOMATOUS COLONIC POLYP (ICD-211.3) Assessment: Unchanged  Problem # 4:  HYPOTHYROIDISM (ICD-244.9) Assessment: Improved continue Synthroid therapy as per primary care.  Patient Instructions: 1)  Begin Xifaxin 550 mg two times a day.  A prescription will be sent to your pharmacy. 2)  Begn Align once daily.  You may purchase this over the counter. 3)  Excessive Gas Diet handout given.  4)  Please schedule a follow-up appointment in 3 weeks.  5)  Labs will be drawn today. 6)  The medication list was reviewed and reconciled.  All changed / newly prescribed medications were explained.  A complete medication list was provided to the patient / caregiver. 7)  Copy sent to : Dr. [name] 8)  Please continue current medications.  9)  Diet should be high in fiber ( fruits, vegetables, whole grains) but low in residue. Drink at least eight (8) glasses of water a day.  Prescriptions: XIFAXAN 550 MG TABS (RIFAXIMIN) 1 by mouth two times a day  #20  x 0   Entered by:   Ashok Cordia RN   Authorized by:   Mardella Layman MD [facility]   Signed by:   Ashok Cordia RN on 08/05/2009   Method used:   Electronically to        [facility]  [address] (retail)       [phone]       [place], [place]  [address]       Ph: [phone]       Fax: [phone]   RxID:   [id number]

## 2010-08-06 NOTE — Miscellaneous (Signed)
Summary: Orders Update clo test  Clinical Lists Changes  Problems: Added new problem of REFLUX, ESOPHAGEAL (ICD-530.81) Orders: Added new Test order of TLB-H Pylori Screen Gastric Biopsy (83013-CLOTEST) - Signed

## 2010-08-06 NOTE — Assessment & Plan Note (Signed)
Summary: 3-WEEK F/U APPT...LSW.    History of Present Illness Visit Type: Follow-up Visit Primary GI MD: Sheryn Bison MD FACP FAGA Primary Provider: [name], MD Chief Complaint: follow-up visit pt. does c/o LUQ pain  states that Xifaxan caused heartburn  History of Present Illness:   Persistent episodic left upper quadrant pain radiating into the back and a 60 year old Caucasian female. Empiric treatment with nonabsorbable oral antibiotics and probiotics has helped her gas and bloating but not her abdominal pain. She is up-to-date on her colonoscopy exams and has known diverticulosis.Her appetite is good her weight is stable. She denies better regularity, melena or hematochezia. Labs from last visit were normal except for mildly elevated sed rate of 39. She has had previous appendectomy and hysterectomy and cesarean section.   GI Review of Systems    Reports abdominal pain.     Location of  Abdominal pain: LUQ.    Denies acid reflux, belching, bloating, chest pain, dysphagia with liquids, dysphagia with solids, heartburn, loss of appetite, nausea, vomiting, vomiting blood, weight loss, and  weight gain.        Denies anal fissure, black tarry stools, change in bowel habit, constipation, diarrhea, diverticulosis, fecal incontinence, heme positive stool, hemorrhoids, irritable bowel syndrome, jaundice, light color stool, liver problems, rectal bleeding, and  rectal pain.    Current Medications (verified): 1)  Mucinex 600 Mg Xr12h-Tab (Guaifenesin) .... Take 1 Tablet By Mouth Two Times A Day 2)  Clearlax  Powd (Polyethylene Glycol 3350) .... Take 1 Dose Daily 3)  Levothroid 125 Mcg Tabs (Levothyroxine Sodium) .... Take 3 Tablets Weekly 4)  Vivelle-Dot 0.075 Mg/24hr Pttw (Estradiol) .... Change Patch Every 3-4 Days 5)  Alprazolam 0.5 Mg Tabs (Alprazolam) .... Take 1 Tablet By Mouth Once A Day As Needed 6)  Simvastatin 40 Mg Tabs (Simvastatin) .... Take 1 Tablet By Mouth Once A  Day  Allergies (verified): 1)  Sulfa  Past History:  Past medical, surgical, family and social histories (including risk factors) reviewed for relevance to current acute and chronic problems.  Past Medical History: Reviewed history from 08/01/2009 and no changes required. Current Problems:  HYPOTHYROIDISM (ICD-244.9) ANXIETY DEPRESSION (ICD-300.4) HYPERTENSION (ICD-401.9) HYPERCHOLESTEROLEMIA (ICD-272.0)  Past Surgical History: Reviewed history from 08/01/2009 and no changes required. Appendectomy Hemorrhoidectomy Hysterectomy Thyroid Surgery Tubal Ligation C Section Laparoscopic rectocele surgery  Family History: Reviewed history from 08/05/2009 and no changes required. 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  Alcohol Use - no Illicit Drug Use - no  Review of Systems  The patient denies allergy/sinus, anemia, anxiety-new, arthritis/joint pain, back pain, blood in urine, breast changes/lumps, change in vision, confusion, cough, coughing up blood, depression-new, fainting, fatigue, fever, headaches-new, hearing problems, heart murmur, heart rhythm changes, itching, muscle pains/cramps, night sweats, nosebleeds, shortness of breath, skin rash, sleeping problems, sore throat, swelling of feet/legs, swollen lymph glands, thirst - excessive, urination - excessive, urination changes/pain, urine leakage, vision changes, and voice change.    Vital Signs:  Patient profile:   60 year old female Height:      62 inches Weight:      160.13 pounds BMI:     29.39 Pulse rate:   88 / minute Pulse rhythm:   regular BP sitting:  126 / 86  (left arm)  Vitals Entered By: Milford Cage NCMA (August 26, 2009  3:10 PM)  Physical Exam  General:  Well developed, well nourished, no acute distress.healthy appearing.  healthy appearing.   Head:  Normocephalic and atraumatic. Eyes:  PERRLA, no icterus.exam deferred to patient's ophthalmologist.  exam deferred to patient's ophthalmologist.   Abdomen:  Soft, nontender and nondistended. No masses, hepatosplenomegaly or hernias noted. Normal bowel sounds. Psych:  Alert and cooperative. Normal mood and affect.   Impression & Recommendations:  Problem # 1:  LUQ PAIN (ICD-789.02) Assessment Unchanged  Repeat labs and will do ultrasound exam and endoscopic exam with p.r.n. sublingual Levsin use. The etiology of her pain is unclear but does suggest possible chronic pancreatitis. I cannot get a history of significant acid reflux or chronic dyspepsia. Overall she feels good and denies systemic complaints. Orders: TLB-B12, Serum-Total ONLY (86578-I69) TLB-Ferritin (82728-FER) TLB-Folic Acid (Folate) (82746-FOL) TLB-IBC Pnl (Iron/FE;Transferrin) (83550-IBC) TLB-CBC Platelet - w/Differential (85025-CBCD) TLB-BMP (Basic Metabolic Panel-BMET) (80048-METABOL) TLB-Hepatic/Liver Function Pnl (80076-HEPATIC) TLB-TSH (Thyroid Stimulating Hormone) (84443-TSH) TLB-Sedimentation Rate (ESR) (85652-ESR)  Problem # 2:  DIVERTICULOSIS OF COLON (ICD-562.10) Assessment: Unchanged Continue high-fiber diet as tolerated  Problem # 3:  ADENOMATOUS COLONIC POLYP (ICD-211.3) Assessment: Unchanged colonoscopy followup as per clinical protocol  Other Orders: Ultrasound Abdomen (UAS) EGD (EGD)  Patient Instructions: 1)  Copy sent to : Dr. [name] 2)  Please continue current medications.  3)  Conscious Sedation brochure given.  4)  Upper Endoscopy brochure given.  5)  ultrasound exam scheduled 6)  Labs pending 7)  P.r.n. sublingual Levsin. 8)  The medication list was reviewed and reconciled.  All changed / newly prescribed medications were explained.  A complete  medication list was provided to the patient / caregiver. Prescriptions: LEVSIN/SL 0.125 MG  SUBL (HYOSCYAMINE SULFATE) 1 by mouth q 4-6 hrs as needed  #40 x 3   Entered by:   Ashok Cordia RN   Authorized by:   Mardella Layman MD [facility]   Signed by:   Ashok Cordia RN on 08/26/2009   Method used:   Electronically to        [facility] [address] (retail)       [phone]       [place], [place]  [address]       Ph: [phone]       Fax: [phone]   RxID:   [id number]

## 2010-08-06 NOTE — Letter (Signed)
Summary: [facility] Instructions  [facility]  [address] [place], [place] [address]   Phone: [phone]  Fax: [phone]       Linda Wang    09-Aug-1950    MRN: [id number]        Procedure Day /Date: 11/05/09, Wednesday     Arrival Time: 1:30      Procedure Time: 2:30     Location of Procedure:                    Linda Wang  [facility] (4th Floor) _                       PREPARATION FOR COLONOSCOPY WITH MOVIPREP   Starting 5 days prior to your procedure 10/31/09 do not eat nuts, seeds, popcorn, corn, beans, peas,  salads, or any raw vegetables.  Do not take any fiber supplements (e.g. Metamucil, Citrucel, and Benefiber).  THE DAY BEFORE YOUR PROCEDURE         DATE: 11/04/09   QIH:[id number]  1.  Drink clear liquids the entire day-NO SOLID FOOD  2.  Do not drink anything colored red or purple.  Avoid juices with pulp.  No orange juice.  3.  Drink at least 64 oz. (8 glasses) of fluid/clear liquids during the day to prevent dehydration and help the prep work efficiently.  CLEAR LIQUIDS INCLUDE: Water Jello Ice Popsicles Tea (sugar ok, no milk/cream) Powdered fruit flavored drinks Coffee (sugar ok, no milk/cream) Gatorade Juice: apple, white grape, white cranberry  Lemonade Clear bullion, consomm, broth Carbonated beverages (any kind) Strained chicken noodle soup Hard Candy                             4.  In the morning, mix first dose of MoviPrep solution:    Empty 1 Pouch A and 1 Pouch B into the disposable container    Add lukewarm drinking water to the top line of the container. Mix to dissolve    Refrigerate (mixed solution should be used within 24 hrs)  5.  Begin drinking the prep at 5:00 p.m. The MoviPrep container is divided by 4 marks.   Every 15 minutes drink the solution down to the next mark (approximately 8 oz) until the full liter is complete.   6.  Follow completed prep with 16 oz of clear liquid of your choice (Nothing red or  purple).  Continue to drink clear liquids until bedtime.  7.  Before going to bed, mix second dose of MoviPrep solution:    Empty 1 Pouch A and 1 Pouch B into the disposable container    Add lukewarm drinking water to the top line of the container. Mix to dissolve    Refrigerate  THE DAY OF YOUR PROCEDURE      DATE: 11/05/09   DAY: Wednesday  Beginning at 9:30 a.m. (5 hours before procedure):         1. Every 15 minutes, drink the solution down to the next mark (approx 8 oz) until the full liter is complete.  2. Follow completed prep with 16 oz. of clear liquid of your choice.    3. You may drink clear liquids until 12:30 (2 HOURS BEFORE PROCEDURE).   MEDICATION INSTRUCTIONS  Unless otherwise instructed, you should take regular prescription medications with a small sip of water   as early as possible the morning of  your procedure.                  OTHER INSTRUCTIONS  You will need a responsible adult at least 60 years of age to accompany you and drive you home.   This person must remain in the waiting room during your procedure.  Wear loose fitting clothing that is easily removed.  Leave jewelry and other valuables at home.  However, you may wish to bring a book to read or  an iPod/MP3 player to listen to music as you wait for your procedure to start.  Remove all body piercing jewelry and leave at home.  Total time from sign-in until discharge is approximately 2-3 hours.  You should go home directly after your procedure and rest.  You can resume normal activities the  day after your procedure.  The day of your procedure you should not:   Drive   Make legal decisions   Operate machinery   Drink alcohol   Return to work  You will receive specific instructions about eating, activities and medications before you leave.    The above instructions have been reviewed and explained to me by   _______________________    I fully understand and can  verbalize these instructions _____________________________ Date _________

## 2010-08-06 NOTE — Letter (Signed)
Summary: Patient [facility] Biopsy Results  [facility]  [address] [place], [place] [address]   Phone: [phone]  Fax: [phone]        September 12, 2009 MRN: [id number]    [facility] [address] [place], [place]  [address]    Dear Ms. Kuper,  I am pleased to inform you that the biopsies taken during your recent endoscopic examination did not show any evidence of cancer upon pathologic examination.  Additional information/recommendations:  __No further action is needed at this time.  Please follow-up with      your primary care physician for your other healthcare needs.  __ Please call [phone] to schedule a return visit to review      your condition.  _xx_ Continue with the treatment plan as outlined on the day of your      exam.  __ You should have a repeat endoscopic examination for this problem              in _ months/years.   Please call us if you are having persistent problems or have questions about your condition that have not been fully answered at this time.  Sincerely,  Mardella Layman MD [facility]  This letter has been electronically signed by your physician.  Appended Document: Patient Notice-Endo Biopsy Results Letter mailed 3.15.11

## 2010-08-26 NOTE — Letter (Signed)
Summary: [facility]   Imported By: Sherian Rein 08/17/2010 14:57:50  _____________________________________________________________________  External Attachment:    Type:   Image     Comment:   External Document

## 2010-11-17 NOTE — Assessment & Plan Note (Signed)
[facility]                            CARDIOLOGY OFFICE NOTE   NAME:Linda Wang                          MRN:          [id number]  DATE:05/16/2007                            DOB:          [birth year]    Linda Wang is seen today at the request of Dr. [name], [facility].   I used to take care of the patient's father, [name].   [name]'s pain started in August. She relates it to work at [employer].  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 The pain is somewhat  atypical. It sounds like her anxiety level had increased. She had pain  in the center of her chest. It would radiate up her neck and arm. It was  not necessarily exertional. It has come and gone. It is somewhat  improved as her stress levels have decreased over the last couple of  months.   The patient was concerned about her heart. She has hypercholesterolemia,  borderline hypertension, and family history of coronary artery disease.   Given her chest pain and risk factors, she was referred here. In talking  to the patient, she clearly indicates that she thinks her pain was  secondary to stress at work. She also had occasional palpitations and  exertional dyspnea. She is a nonsmoker. She has no chronic lung disease.   Her symptoms are improving.   She has tried to work on her cholesterol by diet alone. She has had  success with decreasing triglycerides levels, but her LDL cholesterol  continues to be quite high. Most recent one that I have from her is 164.   Her review of systems otherwise remarkable for occasional swelling in  the ankles and occasional headaches.   PAST MEDICAL HISTORY:  Is remarkable for:  1. Previous C-section.  2. Tubal ligation.  3. Cyst in the right wrist.  4. Hemorrhoids.  5. Appendicitis.  6. Thyroid nodule.  7. Hysterectomy.  8. Laparoscopic rectocele surgery.   ALLERGIES:  Denies any allergies.   FAMILY HISTORY:  Father died at age 60 of a stroke and had premature  coronary disease. I took care of him. Mother died of natural causes at  age 60.   CURRENT MEDICATIONS:  1. Vivelle 0.075 patches.  2. Synthroid question dose.  3. Omega vitamins.  4. Aspirin a day.  5. She also takes Xanax when needed.   LABORATORY DATA:  Labs that she had for me to review indicated a total  cholesterol on October 16 of 221, LDL of 160, HDL of 49 and  triglycerides of 111.   SOCIAL HISTORY:  She is divorced. She has one son who is 36 and no  grandchildren. She is currently working as a Conservation officer, nature at [employer].  She does not smoke or drink. She has a longtime boyfriend named [name].   PHYSICAL EXAMINATION:  Is remarkable for an anxious talkative white  female in no distress. Weight is 155, blood pressure 150/80,  pulse 88  and regular. Afebrile. Respiratory rate is 14.  HEENT: Is unremarkable. Carotids are without bruits. There is no  thyromegaly. No lymphadenopathy. No JVP elevation.  LUNGS:  Are clear with good diaphragmatic motion.  No wheezing.  There is an S1, S2 normal heart sounds. PMI normal.  ABDOMEN: Is benign. Bowel sounds positive. No tenderness. No AAA. No  hepatosplenomegaly or hepatojugular reflux.  Distal pulses intact. No edema.  NEURO: Is nonfocal.  SKIN: Is warm and dry.  No muscular weakness.   BASELINE EKG: Normal.   Cholesterol labs as indicated.   IMPRESSION:  1. Atypical chest pain, multiple coronary risk factors. Followup      stress Myoview.  2. Hypercholesterolemia in the setting of multiple risk factors and      family history. I called her in a prescription for Zocor 20 mg a      day at the [facility] on [address]. She will followup with Dr.      Clarene Duke in regards to liver profile in 8 weeks. This was      specifically indicated on her prescription.  3. Borderline hypertension. Continue activity levels. Low  salt diet.      Followup with home monitoring. Encouraged to buy her own digital      cuff. I suspect she has a bit of white coat hypertension.  4. Anxiety and depression, p.r.n. Xanax which she apparently has a      prescription for. A lot of problems are reactional and she needs to      deal with them in regards to her relationship with her manager at      [employer]. She seems to be doing better with this as long as      her      stress test is normal. I will see her on a p.r.n. basis and Dr.      Clarene Duke can further followup her response to Zocor.     [name]. Eden Emms, MD, [facility]  Electronically Signed    PCN/MedQ  DD: 05/16/2007  DT: 05/16/2007  Job #: [id number]

## 2010-11-20 NOTE — Op Note (Signed)
Linda Wang, Linda Wang                   ACCOUNT NO.:  [id number]   MEDICAL RECORD NO.:  [id number]          PATIENT TYPE:  OBV   LOCATION:  9399                          FACILITY:  [facility]   PHYSICIAN:  Freddy Finner, M.D.   DATE OF BIRTH:  August 12, 1950   DATE OF PROCEDURE:  05/07/2004  DATE OF DISCHARGE:                                 OPERATIVE REPORT   PREOPERATIVE DIAGNOSES:  1.  Chronic pelvic pain.  2.  Symptoms rectocele.   POSTOPERATIVE DIAGNOSES:  1.  Chronic pelvic pain.  2.  Symptoms rectocele.  3.  Pelvic adhesions.   OPERATIVE PROCEDURE:  Laparoscopy, lysis of pelvic adhesions, posterior  vaginal repair.   SURGEON:  Freddy Finner, M.D.   ANESTHESIA:  General.   INTRAOPERATIVE COMPLICATIONS:  None.   INTRAOPERATIVE BLOOD LOSS:  Less than or equal to 100 mL.   Details of the present illness are recorded in the admission H&P.  The  patient was admitted on the morning of surgery.  She was given an antibiotic  bolus preoperatively.  She was brought to the operating room.  She was in  PAS hose.  The abdomen, perineum, and vagina were prepped in the usual  fashion.  The bladder was evacuated using sterile technique.  Sponge forceps  with two sponges, the tip was placed into the vagina for potential use  during the procedure.  Sterile drapes were applied.  Two small incisions  were made through all scars - one at the umbilicus and one just above the  symphysis.  An 11 mm nonbladed disposable trocar was introduced at the  umbilical incision while elevating the anterior abdominal wall manually.  Direct inspection revealed adequate placement with no evidence of injury on  entry.  Pneumoperitoneum was allowed to accumulate with carbon dioxide gas.  Careful examination of the abdomen and pelvis was carried out.  There was no  apparent abnormality in the upper abdomen.  The appendix was surgically  absent.  There were titanium staples from that procedure; there were  titanium  staples in the pelvis along the right pelvic sidewall from previous  oophorectomy.  There were filmy and some dense adhesions from the epiploica  of the sigmoid and rectus sigmoid to pelvic structures, particularly on the  left and in the midline.  These were all carefully lysed using unipolar  cautery and an EndoShear reticulated scissor through the operating channel  of the laparoscope.  This was accomplished without injury or without any  significant bleeding.  Having completed this portion of the procedure,  inspection under reduced intraabdominal pressure revealed complete  hemostasis, all instruments removed.  Skin incisions were closed with  interrupted subcuticular sutures of 3-0 Dexon and 0.25% plain Marcaine was  injected into the incision sites.  Steri-Strips were applied to the lower  incision and sterile dressing to the umbilical incision.   Attention was turned to the posterior repair.  The fourchette was grasped on  each side with an Allis clamp.  A pyramidal-shaped segment of skin was  excised from the perineal body.  Then,  the mucosa overlying the rectum was  carefully sharply and bluntly dissected free of the mucosa, an incision made  at the mucosa for a distance of approximately 6-8 cm.  Careful dissection  with blunt and sharp dissection of perirectal tissues and submucosal tissues  was carried out.  The perirectal tissue was then approximated with  interrupted 0 Monocryl sutures to recreate the rectovaginal septum.  The  perineal body was elevated with a levator stitch of 0 Monocryl.  A small  segment of mucosa was removed vaginally.  The incision was then closed in a  fashion similar to episiotomy with a running 2-0 Monocryl suture along the  mucosal incision and a subcuticular suture on the perineal body.  The  patient tolerated the operative procedure well, hemostasis was excellent,  and the procedure was terminated, all instruments were removed.  The patient  was  taken to recovery in good condition.     Hosie Spangle   WRN/MEDQ  D:  05/07/2004  T:  05/07/2004  Job:  [id number]

## 2010-11-20 NOTE — H&P (Signed)
NAME:  Linda Wang, Linda Wang                             ACCOUNT NO.:  [id number]   MEDICAL RECORD NO.:  [id number]                   PATIENT TYPE:  EMS   LOCATION:  ED                                   FACILITY:  [facility]   PHYSICIAN:  Currie Paris, M.D.           DATE OF BIRTH:  [birth year]   DATE OF ADMISSION:  04/18/2002  DATE OF DISCHARGE:                                HISTORY & PHYSICAL   CHIEF COMPLAINT:  Nausea and vomiting and abdominal pain.   HISTORY OF PRESENT ILLNESS:  The patient is an approximately 60 year old  lady who has had a history back in 1/03, with diverticulitis of the  transverse colon with a confirmed perforation which responded nicely to  medical management.  She has been known to have diffuse diverticulosis.  In  9/03, she underwent another CT scan because of abdominal pain, and there was  no evidence of acute diverticulitis, although there was some question of  some abnormality that was minimal in the area of the appendix.  She has been  followed up by Dr. Lavell Islam, status post thyroidectomy and on replacement  therapy, and she mentioned to him about her abdominal pain, and he was  concerned she may have developed a diverticular stricture, have some other  source, and recommended a barium enema.   The patient underwent her barium enema this morning, but began having  increasing nausea and vomiting, along with the diarrhea secondary to her  bowel prep, and she thought it was all from her bowel prep.  However, after  the barium enema, she was continuing to have abdominal pain, and Dr. Earlene Plater  saw her in the office this morning and sent her to the emergency room for  some pain medication with the hope that she would be able to go home, as the  patient really did not want to come into the hospital at that point in time.  However, since she has been in the emergency room she has continued to have  abdominal pain and nausea, and has gotten somewhat groggy from the  pain  medication.  Dr. Geanie Logan, the EDP, checked some labs, electrolytes looked okay,  but her white count was noted to be 15,000.  She had received a couple  liters of fluid, and felt a little bit better, but still felt quite weak and  nauseated, and it was felt that she might be better off being admitted to  the hospital.   PAST MEDICAL HISTORY:  Noted in the old records.   MEDICATIONS:  1. Synthroid which is being adjusted and modified by Dr. Earlene Plater.  2. Hormone replacement medications.   ALLERGIES:  No known drug allergies.   REVIEW OF SYMPTOMS:  Basically negative except for the thyroid problems.   PHYSICAL EXAMINATION:  GENERAL:  The patient is alert and oriented.  Her  speech sounds slightly thick tongued, probably secondary to  her pain  medication which she received earlier.  She is, however, oriented x3.  HEENT:  Normocephalic.  Eyes are not icteric.  Pupils are round and regular.  Mucus membranes now appears moist, but she has had 2 L of fluids.  NECK:  Supple, no masses or thyromegaly.  LUNGS:  Clear to auscultation.  HEART:  Regular, no murmurs, rubs, or gallops.  ABDOMEN:  Slightly distended, basically soft, but some tenderness in the  right mid abdomen and right lower abdomen, but no rebound present.  Bowel  sounds are present.  PELVIC:  Not done.  RECTAL:  Not done.  EXTREMITIES:  No cyanosis or edema.   LABORATORY DATA:  Normal electrolytes.  The white blood cell count is  15,000.   The barium enema shows diffuse diverticulosis, but nothing to suggest  diverticulitis.  She does, however, have some spasm of the cecum of  questionable significance.  The appendix was not seen, although apparently  on the CT scan last month it was visualized, and thought to be upper limits  of normal size.   IMPRESSION:  1. Nausea and vomiting, probably secondary to bowel prep.  2. Abdominal pain, chronic, with positive acute exacerbation possibly     secondary to bowel prep and/or  some other primary abdominal problem.  3. Volume depletion, partially treated.  4. Hypothyroidism.   PLAN:  I think we ought to go ahead and admit her, give her some pain  medication, continue IV fluids tonight, recheck her labs in the morning.  I  will try to give her a couple of simple saline enemas to see if we can get  the barium out as she might need a CT scan and re-evaluation tomorrow  depending on how she is feeling and her overall situation at that point in  time.  I discussed that with the patient, she is agreeable with these plans.                                                Currie Paris, M.D.    CJS/MEDQ  D:  04/18/2002  T:  04/18/2002  Job:  [id number]

## 2010-11-20 NOTE — Discharge Summary (Signed)
NAMESAMERA, MACY                   ACCOUNT NO.:  [id number]   MEDICAL RECORD NO.:  [id number]          PATIENT TYPE:  OBV   LOCATION:  9320                          FACILITY:  [facility]   PHYSICIAN:  Freddy Finner, M.D.   DATE OF BIRTH:  06/04/1951   DATE OF ADMISSION:  05/07/2004  DATE OF DISCHARGE:                                 DISCHARGE SUMMARY   DISCHARGE DIAGNOSES:  1.  Symptomatic second degree rectocele.  2.  Chronic pelvic pain with pelvic adhesions.   OPERATIVE PROCEDURE:  Laparoscopic lysis of pelvic adhesions and posterior  vaginal repair.   INTRAOPERATIVE AND POSTOPERATIVE COMPLICATIONS:  None.   DISPOSITION:  The patient is in satisfactory and improved condition at the  time of her discharge.  She is to have progressively increasing physical  activity, no vaginal entry or heavy lifting.  She is to return to the office  in approximately 10 days for first postoperative visit.  She is to take  Percocet 5/325 as needed for postoperative pain and/or ibuprofen or Tylenol.  She is to take a stool softener as needed.  She is to use sitz baths.  She  is to take a regular diet.   Details of the present illness, past history, family history, review of  systems, and physical exam are recorded in the admission note.  Physical  findings on admission were primarily remarkable for the rectocele.  Other  parameters of her physical exam were essentially normal for a postoperative  status post hysterectomy state.   Laboratory data during this admission hemoglobin of 14.5, postoperative  hemoglobin of 11.9.   HOSPITAL COURSE:  The patient was admitted on the morning of surgery and the  procedure was accomplished as described above.  Her postoperative course was  uncomplicated, she remained afebrile.  By the morning of postoperative day  #1 she was ambulating without difficulty, having adequate bowel and bladder  function, she had been afebrile throughout her hospital stay.  Her  condition  was considered to be satisfactory and she was discharged home with  disposition as noted above.     Hosie Spangle   WRN/MEDQ  D:  05/08/2004  T:  05/08/2004  Job:  [id number]

## 2010-11-20 NOTE — Op Note (Signed)
NAME:  Linda Wang, Linda Wang                             ACCOUNT NO.:  [id number]   MEDICAL RECORD NO.:  [id number]                   PATIENT TYPE:  INP   LOCATION:  0448                                 FACILITY:  [facility]   PHYSICIAN:  Timothy E. Earlene Plater, M.D.              DATE OF BIRTH:  1951/02/25   DATE OF PROCEDURE:  04/19/2002  DATE OF DISCHARGE:                                 OPERATIVE REPORT   PREOPERATIVE DIAGNOSIS:  Suspected appendicitis.   POSTOPERATIVE DIAGNOSIS:  Acute suppurative appendicitis.   PROCEDURE:  Laparoscopic appendectomy.   SURGEON:  Timothy E. Earlene Plater, M.D.   ANESTHESIA:  General.   INDICATION:  Please see the hospital notes but in brief, Ms. Rhinehart is 51, had  been otherwise healthy.  She has a year history of intermittent right lower  quadrant pain.  She also has a history of diverticulosis and diverticulitis.  Because of gastrointestinal symptoms, she was scheduled for a barium enema  yesterday.  This was done with great cramping and pain and after the study,  she had nausea and vomiting.  She was seen in the office, sent to the  emergency room, and admitted overnight.  A prior CT scan had suggested an  unusual appearing appendix, and the barium enema suggested acute changes at  the base of the cecum.  Because the patient's pain did not defervesce and  the white count stayed elevated, she and I agreed that an appendectomy was  in order.   DESCRIPTION OF PROCEDURE:  She was seen and identified, the permit signed,  evaluated by anesthesia, and taken back to the operating room, placed  supine, and general endotracheal anesthesia administered.  Pas hose, a Foley catheter, and an orogastric tube placed.  The abdomen was  prepped and draped in the usual fashion.  MARCAINE 25% with epinephrine was  used throughout prior to each incision.  An infraumbilical incision made,  the peritoneum  entered without complication, a suture placed in the fascia,  the Hasson catheter  placed through the suture and the suture tied, the  abdomen insufflated.  A 5 mm trocar was placed in the lower midline under  direct vision.  Using simple blunt dissection, there was cloudy fluid in the  pelvis and an exudate over a visible appendix.  A 12 mm trocar introduced in  the left lower quadrant and using a bimanual technique with laparoscopic  guidance, the appendix was freed up.  Its mesentery was isolated, the  mesentery divided between three applications of the GIA staple device, the  base of the appendix identified and clamped and divided with the GIA staple  device.  The suppurative appendix was placed in an EndoCatch bag and drawn  to the left lower quadrant wound exit.  Copious irrigation carried out;  careful visualization of the wound was accomplished.  One metal clip was  placed over the mesentery of  the appendix.  The wound was perfectly dry.  Copious irrigation was carried out until clear.  The appendix was then  removed with the EndoCatch bag through the left lower quadrant wound.  The  fascia was approximated with a single suture of #1 Vicryl and the 5 mm  trocar removed under direct vision.  And then the procedure was complete.  The midline trocar removed under direct vision and that fascia sutured with  the existing #1 Vicryl.  Counts were correct and the skin incisions closed  with interrupted 4-0 Vicryl.  Steri-Strips applied, and dry sterile dressing  applied.  She tolerated it well, was awakened, and taken to recovery room in  good condition.                                              Timothy E. Earlene Plater, M.D.   TED/MEDQ  D:  04/19/2002  T:  04/20/2002  Job:  [id number]

## 2010-11-20 NOTE — H&P (Signed)
[facility]  Patient:    Linda Wang, Linda Wang I Visit Number: [id number] MRN: [id number]          Service Type: MED Location: 4E 0401 02 Attending Physician:  Judeth Cornfield Dictated by:   Sammuel Cooper, P.A.C. Admit Date:  07/13/2001 Discharge Date: 07/14/2001   CC:         Caryn Bee L. Little, M.D.  Timothy E. Earlene Plater, M.D.   History and Physical  CHIEF COMPLAINT:  Abdominal pain, progressive x4 days.  HISTORY OF PRESENT ILLNESS:  The patient is a very nice 60 year old white female known to Dr. Yancey Flemings from her colonoscopy done in July 2001, for complaints of abdominal pain and intermittent rectal bleeding.  Colonoscopy was done at that time showing incidental colon polyps which were tubular adenomas and mild pandiverticulosis.  She was also noted to have a moderately large internal hemorrhoid.  The patient is status post complete hysterectomy in 2000, remote bilateral tubal ligation, and partial thyroidectomy for a nodule in March 2001, per Dr. Earlene Plater.  She is on thyroid replacement.  She has now had onset of abdominal pain, initially on New Years Day, which seemed to subside and then recurred on Sunday, July 09, 2001, and has become constant and progressive since, located across the lower abdomen.  She has had associated nausea without vomiting, chills, but no documented fever, and has noted some increased sweats.  She has not had any change in her bowel habits except that she had diarrhea after a CT scan was done yesterday.  Her appetite has been decreased, and she has not been eating much due to pain over the past couple of days. She was seen by Dr. Clarene Duke, her primary care [facility], on July 11, 2001, and then CT of the abdomen and pelvis was done on July 12, 2001, which shows a contained colonic perforation in the distal transverse colon, a 1.7 cm fluid collection.  Question perforated carcinoma versus diverticulitis.  The patient is  admitted at this time for IV antibiotics, surgical consultation.  Of note, she has reported intermittent similar abdominal pain which has not been severe over the past couple of years.  CURRENT MEDICATIONS: 1. Vivelle twice weekly 0.1 mg. 2. Synthroid 0.125 Monday, Wednesday, Friday.  ALLERGIES:  No known drug allergies.  PAST MEDICAL HISTORY:  As outlined above.  She has also had a right ganglion cyst extraction.  FAMILY HISTORY:  Pertinent for one brother with history of colon cancer. Mother with thyroid CA.  SOCIAL HISTORY:  The patient is married.  No tobacco, no ETOH.  She works part-time.  Has one child.  REVIEW OF SYSTEMS:  CARDIOVASCULAR:  Denies any chest pain or anginal symptoms.  PULMONARY:  Negative for cough, shortness of breath, or sputum production.  GENITOURINARY:  Negative for dysuria, urgency, or frequency.  She has had recurrent UTIs.  PHYSICAL EXAMINATION:  GENERAL:  Well-developed white female in no acute distress.  VITAL SIGNS:  Temperature 100.9, blood pressure 134/90, pulse 119, respirations 16.  HEENT:  Atraumatic, normocephalic.  EOMI.  PERRLA.  Sclerae anicteric.  NECK:  Supple.  Without nodes.  She is status post partial thyroidectomy.  CARDIOVASCULAR:  Regular rate and rhythm.  With S1, S2.  Slight tachycardia. LUNGS:  Clear to A&P.  ABDOMEN:  Soft.  Bowel sounds are quiet.  She is tender in her mid abdomen and slightly to the left.  There is mild diffuse tenderness.  No guarding to my exam, but positive early rebound  bilateral lower quadrants.  RECTAL:  Not done.  Patient is in the hallway in the ER.  EXTREMITIES:  Without clubbing, cyanosis, or edema.  NEUROLOGIC:  Nonfocal.  LABORATORY DATA:  Pending at the time of admission.  IMPRESSION: 1. [age]-year-old white female with contained distal transverse colon    perforation with abdominal pain x5 days and now low-grade fever.  Question    diverticular etiology, question foreign body,  question malignancy. 2. History of colon polyps and diverticulosis. 3. Status post partial thyroidectomy, on thyroid replacement. 4. Status post complete hysterectomy and remote bilateral tubal ligation.  PLAN:  The patient is admitted to the service of Dr. Melvia Heaps for IV fluids, hydration, bowel rest, pain control.  She will be started on IV Unasyn.  Will obtain surgical consultation with Dr. Earlene Plater. Dictated by:   Sammuel Cooper, P.A.C. Attending Physician:  Judeth Cornfield DD:  07/14/01 TD:  07/14/01 Job: [id number]

## 2010-11-20 NOTE — H&P (Signed)
Linda Wang, WESTRICH                   ACCOUNT NO.:  [id number]   MEDICAL RECORD NO.:  [id number]          PATIENT TYPE:  AMB   LOCATION:  SDC                           FACILITY:  [facility]   PHYSICIAN:  Freddy Finner, M.D.   DATE OF BIRTH:  [birth year]   DATE OF ADMISSION:  05/07/2004  DATE OF DISCHARGE:                                HISTORY & PHYSICAL   ADMITTING DIAGNOSES:  1.  Symptomatic second degree rectocele.  2.  Chronic pelvic pain.   HISTORY OF PRESENT ILLNESS:  The patient is a 60 year old white single  female with 1 living child who has had a long history of pelvic pain and GYN  problems.  Previous GYN surgical procedures include laparoscopic  sterilization, diagnostic laparoscopy, with findings of pelvic adhesions,  uterine enlargement in 1999, and uterine leiomyomata, and subsequent  laparoscopically-assisted vaginal hysterectomy and bilateral salpingo-  oophorectomy in 2000.  Over time, she has been known to have irritable bowel  syndrome, and is known to have diverticulitis.  Has also recently had repair  of an anal fissure in September with Dr. Kendrick Ranch.  She continues to  complain of discomfort with pressure and fullness in the vagina with  intercourse, and also intermittent chronic episodes of pelvic pain, which  she thinks are just distinctly different than bowel pain.  She is admitted  now for diagnostic laparoscopy, posterior vaginal repair.   PAST MEDICAL HISTORY:  Surgical history is remarkable as noted above.  She  had a cesarean delivery of her only term birth in 60.  She has had  previous abnormal Pap smears treated, and subsequently underwent a  hysterectomy as noted above.   ALLERGIES:  She has no known allergies to medications.   She has never had a blood transfusion, except at birth.   CURRENT MEDICATIONS:  1.  Synthroid for hypothyroidism.  2.  Vivelle-Dot 0.1 mg for hormone replacement therapy.   CURRENT REVIEW OF SYSTEMS:  Pretty much negative,  except as noted above.  There are no cardiopulmonary symptoms.  No other GI or GU symptoms.   FAMILY HISTORY:  Noncontributory.   PHYSICAL EXAMINATION:  HEENT:  Grossly within normal limits.  Thyroid gland  is not palpably enlarged.  VITAL SIGNS:  Blood pressure in the office 132/80.  CHEST:  Clear to auscultation.  HEART:  Normal sinus rhythm without murmurs, rubs, or gallops.  ABDOMEN:  Soft.  Minimal tenderness to deep palpation in the lower  quadrants.  No peritoneal signs, no palpable masses, no CVA tenderness.  PELVIC:  The patient has a second degree rectocele.  The __________  epithelium is normal.  Anterior support is good.  Bimanual exam reveals no  palpable masses.  There is some mild pelvic tenderness.  RECTOVAGINAL:  Confirms these findings, and does not reveal other palpable  abnormalities.  EXTREMITIES:  Without clubbing, cyanosis, or edema.   ASSESSMENT:  1.  Chronic pelvic pain, suspect pelvic adhesions.  2.  Secondary rectocele, symptomatic.   PLAN:  Diagnostic laparoscopy, posterior vaginal repair.     W. Elvera Lennox  WRN/MEDQ  D:  05/06/2004  T:  05/06/2004  Job:  [id number]

## 2010-11-20 NOTE — Op Note (Signed)
[name], Linda Wang                   ACCOUNT NO.:  [id number]   MEDICAL RECORD NO.:  [id number]          PATIENT TYPE:  AMB   LOCATION:  DAY                          FACILITY:  [facility]   PHYSICIAN:  Timothy E. Earlene Plater, M.D. DATE OF BIRTH:  1951-02-26   DATE OF PROCEDURE:  03/31/2004  DATE OF DISCHARGE:                                 OPERATIVE REPORT   PREOPERATIVE DIAGNOSIS:  Anal fissure, hemorrhoids.   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   OPERATIVE PROCEDURE:  Examination under anesthesia.  Band ligation of  hemorrhoids.   SURGEON:  Lorelee New, M.D.   ANESTHESIA:  General.   Ms. Linda Wang is 52, well known to me.  I have seen the patient for years  and treated her intermittent rectal problems.  She also has a history of  diverticulosis and constipation.  When seen in the office, she did have a  posterior anal fissure and moderate anal spasm and pain.  She was treated  conservatively for approximately six weeks and failed to respond.  Because  of that, she is now scheduled for surgery.  She was seen, identified, and  the permit signed.   Patient was taken to the operating room, placed supine.  LMA anesthesia  provided.  She was then gently placed in the lithotomy position.  Her  perianal area was inspected, prepped and draped in the usual fashion.  Gentle digital dilatation of the anal rectum was accomplished without  difficulty.  There was no stenosis, and visual exam revealed no fissure.  I  was then able to insert the adult-operating anoscope without any difficulty.  On more careful exam, the external sphincter is actually relaxed.  There is  no anterior perineal body, and there is a prominent rectocele.  During  anoscopy, mucosal prolapse was noted.  There were prominent right anterior  and posterior hemorrhoids.  Since there was so much relaxation, particularly  with the external  sphincter, I elected not to do an internal sphincterotomy,  as certainly the fissure had healed.  The anus was gently stretched to two  full fingers, which was the same diameter as the operating anoscope.  I also  band-ligated the right anterior and right posterior internal hemorrhoids.  I  elected to terminate the procedure at this point and will defer further  treatment to future workup and gynecologic evaluation.  Patient tolerated it  well.  Dry sterile dressings applied.  She was removed to the recovery room  in good condition.   Written and verbal instructions were given, and she will be seen and  followed as an outpatient.      TED/MEDQ  D:  03/31/2004  T:  03/31/2004  Job:  [id number]

## 2010-11-20 NOTE — Discharge Summary (Signed)
[facility]  Patient:    Linda Wang, Linda Wang Visit Number: [id number] MRN: [id number]          Service Type: MED Location: 3W [id number] 01 Attending Physician:  Judeth Cornfield Dictated by:   Sammuel Cooper, P.A.-C. Admit Date:  07/13/2001 Discharge Date: 07/19/2001   CC:         Caryn Bee L. Little, M.D.  Timothy E. Earlene Plater, M.D.   Discharge Summary  ADMITTING DIAGNOSES: 56. A 60 year old white female with contained distal transverse colon    perforation with abdominal pain x 5 days and low grade fever.  Rule out    diverticular etiology versus foreign body versus malignancy. 2. History of colon polyps and diverticulosis. 3. Status post partial thyroidectomy on thyroid replacement. 4. Status post complete hysterectomy and remote bilateral tubal ligation.  DISCHARGE DIAGNOSES: 1. Resolving contained transverse colon perforation with significant    improvement in inflammatory changes most consistent with diverticular    etiology. 2. Other diagnoses as listed above.  CONSULTATIONS:  Dr. Earlene Plater, surgery.  PROCEDURES:  CT scan of the abdomen and pelvis x 2.  HISTORY OF PRESENT ILLNESS:  Linda Wang is a very nice 60 year old white female known to Dr. Yancey Flemings and primary patient of Dr. [name].  Patient had undergone colonoscopy in July 2001 for complaints of abdominal pain and intermittent rectal bleeding.  She had incidental colon polyps which were tubular adenomas and mild pandiverticulosis.  She was also noted to have moderately large internal hemorrhoids.  At this point, the patient had onset of abdominal pain on New Years Day which then seemed to subside and then recurred and increased in severity on January 5.  Since that time, her pain has become constant and progressive, located across the lower abdomen.  She did have associated nausea without vomiting, chills, but no fever, and has had some increased sweats.  Patient had not had any change in her  bowel habits except that she had diarrhea with CT scan done on January 8.  Her appetite has been poor and she has not been eating much over the past few days.  She had been seen by Dr. Clarene Duke on January 7 and underwent CT scan of the abdomen and pelvis on January 8 which does show a contained colonic perforation in the distal transverse colon with a 1.7 cm fluid collection, question perforated carcinoma versus diverticulitis.  Patient was admitted to the hospital for IV antibiotics and surgical consultation.  CURRENT LABS:  Stools on admission were Hemoccult negative.  CBC on admission on January 9 showed a WBC of 12.2 with 80% segs, hemoglobin 14.8, hematocrit of 42.2, MCV of 88, platelets of 298.  Follow-up on January 14, WBC of 5.7, hemoglobin 12.0, hematocrit of 35.0, platelets 250, pro time 12.1, INR 0.9. Liver function studies entirely normal.  Electrolytes were normal on admission, glucose 103, BUN 6, creatinine 0.5, albumin 4.1.  Urinalysis was negative.  X-RAY STUDIES:  Plain films of the abdomen on admission:  No evidence of acute abdominal process, no free air.  CT of the abdomen and pelvis on July 12, 2001, done at [facility] read focal area of soft tissue thickening in the distal transverse colon with a 1.7 cm contained perforation and surrounding soft tissue stranding.  The soft tissue thickening felt to have well-defined margins distally suspicious for a perforated colon carcinoma, diverticulitis also a possibility.  Appendix is visualized and has a normal appearance, no small bowel abnormalities noted.  No free peritoneal  fluid or enlarged lymph nodes.  A CT scan of the abdomen and pelvis done on July 19, 2001, in follow-up shows significantly improved inflammatory changes in the transverse colon. There is evidence of a contained perforation but marked decrease in colonic wall thickening and surrounding inflammatory changes.  There is no evidence of abscess.  HOSPITAL  COURSE:  Patient was admitted to the service of Dr. Arlyce Dice who was covering the hospital.  She was initially kept n.p.o., started on IV Unasyn, given Demerol and Phenergan as needed for pain and nausea.  She was continued on her home medications.  We did obtain a surgical consultation with Dr. Earlene Plater who felt that she did have a precarious situation and that it was also unusual to have a transverse colon focal perforation.  Nevertheless, he agreed with conservative management with IV antibiotics and bowel rest.  Patients symptoms gradually improved.  On January 11, she was started on clear liquids. She did have some very low-grade temperatures in the 99 to 100 range the first few days and was afebrile thereafter.  We advanced her to a full liquid diet and she had a brief attempt at a low residue diet on January 12 which increased her discomfort and we backed off to full liquids again.  On January 15, the patient underwent repeat CT scan as described above with significant improvement in the inflammatory changes.  At that point, the patient was feeling well.  She was having no ongoing abdominal discomfort and was tolerating a full liquid diet without any difficulty.  She was allowed discharge to home on January 15 in a stable and improved condition with instructions to continue Cipro, with instructions to take Cipro 500 mg p.o. b.Wang.d. x 14 days, Synthroid as previous, Vivelle patch as previous, and Darvocet-N 100 every 6 hours if needed for pain.  She was to maintain a low-residue diet until seen back in the office by Dr. Marina Goodell.  We arranged follow-up CT scan of the abdomen and pelvis to be done on January 30 at 10 a.m. at [facility].  The patient was instructed to call our office should she develop any fever, increased abdominal pain, nausea, vomiting, etc., at home. She has follow-up office visit with Dr. Marina Goodell on February 4 at 2:30 p.m.  At  that time, pending her course and CT findings,  will decide on the need for a follow-up colonoscopy and eventual referral back to Dr. Earlene Plater for resection. Dictated by:   Sammuel Cooper, P.A.-C. Attending Physician:  Judeth Cornfield DD:  07/19/01 TD:  07/19/01 Job: [id number]

## 2010-11-20 NOTE — Discharge Summary (Signed)
   NAME:  Linda Wang, Linda Wang                             ACCOUNT NO.:  [id number]   MEDICAL RECORD NO.:  [id number]                   PATIENT TYPE:  INP   LOCATION:  0448                                 FACILITY:  [facility]   PHYSICIAN:  Timothy E. Earlene Plater, M.D.              DATE OF BIRTH:  Oct 04, 1950   DATE OF ADMISSION:  04/18/2002  DATE OF DISCHARGE:  04/20/2002                                 DISCHARGE SUMMARY   FINAL DIAGNOSIS:  Acute appendicitis.   See Dr. Tenna Child admission note.  Briefly this patient is known to have  diverticulosis.  She had had chronic right lower quadrant pain.  A barium  enema was scheduled electively to evaluate the abdominal pain as its  relation to the diverticulitis.  In preparation for the barium enema she had  undue and larger than usual right lower quadrant pain.  Following the barium  enema, she had nausea and vomiting and had to be admitted.  At that time her  abdomen revealed tenderness.  Her labs revealed an elevated white count of  15,000 with a left shift.  Her BE done as an outpatient prior to this showed  diverticulosis and a questionable abnormality of the appendix.  When seen  and reevaluated in the early hours of October 16, it was thought she  probably had appendicitis and was taken to the operating room, where with  laparoscopic surgery, she had acute appendicitis and the appendix was  removed.   HOSPITAL COURSE:  Her postoperative recovery was smooth and benign.  There  were no complications and she was discharged on postoperative day #1.   She was tolerating the diet.  Bowel function had resumed.  She was up and  ambulatory.  She was given Cipro 500, #10, one b.i.d., and pain pills and  will be seen in follow-up closely as an outpatient.                                                  Timothy E. Earlene Plater, M.D.    TED/MEDQ  D:  05/01/2002  T:  05/01/2002  Job:  [id number]

## 2011-05-21 ENCOUNTER — Emergency Department (HOSPITAL_COMMUNITY): Payer: BC Managed Care – PPO

## 2011-05-21 ENCOUNTER — Emergency Department (HOSPITAL_COMMUNITY)
Admission: EM | Admit: 2011-05-21 | Discharge: 2011-05-21 | Disposition: A | Discharge status: Home / Self Care | Payer: BC Managed Care – PPO | Attending: Emergency Medicine | Admitting: Emergency Medicine

## 2011-05-21 ENCOUNTER — Encounter: Payer: Self-pay | Admitting: Emergency Medicine

## 2011-05-21 DIAGNOSIS — M79609 Pain in unspecified limb: Secondary | ICD-10-CM | POA: Insufficient documentation

## 2011-05-21 DIAGNOSIS — X58XXXA Exposure to other specified factors, initial encounter: Secondary | ICD-10-CM | POA: Insufficient documentation

## 2011-05-21 DIAGNOSIS — R229 Localized swelling, mass and lump, unspecified: Secondary | ICD-10-CM | POA: Insufficient documentation

## 2011-05-21 DIAGNOSIS — T148XXA Other injury of unspecified body region, initial encounter: Secondary | ICD-10-CM | POA: Insufficient documentation

## 2011-05-21 DIAGNOSIS — E039 Hypothyroidism, unspecified: Secondary | ICD-10-CM | POA: Insufficient documentation

## 2011-05-21 NOTE — ED Provider Notes (Signed)
Medical screening examination/treatment/procedure(s) were performed by non-physician practitioner and as supervising physician I was immediately available for consultation/collaboration.  Flint Melter, MD 05/21/11 [phone]

## 2011-05-21 NOTE — ED Provider Notes (Signed)
Medical screening examination/treatment/procedure(s) were conducted as a shared visit with non-physician practitioner(s) and myself.  I personally evaluated the patient during the encounter.  Patient with right hand swelling, dorsum, after lifting a heavy ice chest is morning. She was seen in urgent care and told that she might have a tumor so was sent here for evaluation. Here the x-ray does not show a fracture. The evaluation reveals an apparent hematoma, dorsum right hand over the third and fourth metacarpal bones. Motion and Sensation are both normal in the right hand. This swollen area appears to be a hematoma. Further workup indicated at this time.  Flint Melter, MD 05/21/11 1102

## 2011-05-21 NOTE — ED Notes (Signed)
Pt presenting to ed with c/o swelling to right hand pt denies injury to hand. Pt seen at urgent care and told to present to ed to r/o acute cystic mass versus neoplasm

## 2011-05-21 NOTE — ED Provider Notes (Signed)
History     CSN: [id number] Arrival date & time: 05/21/2011  9:55 AM   First MD Initiated Contact with Patient 05/21/11 1008      Chief Complaint  Patient presents with  . Hand Pain    (Consider location/radiation/quality/duration/timing/severity/associated sxs/prior treatment) Patient is a 60 y.o. female presenting with hand pain. The history is provided by the patient and the spouse.  Hand Pain This is a new problem. The current episode started today.  Pt denies injury to back of right hand. Pt states that she woke up this morning and at about 9:30 am she noted that her right hand was swollen and tender. She denies any lesions or signs of the same injury there the day before. She went to the Urgent Care to have it looked at where she was told that it may possibly be a neoplasm and sent to the ER for a r/o between cyst/neoplasm. Pt has no history or cancer, no fever, no weakness. Positive pain to back of right hand.  Past Medical History  Diagnosis Date  . Asthma   . Hypothyroid     Past Surgical History  Procedure Date  . Abdominal hysterectomy   . Tubal ligation   . Thyroidectomy, partial   . Hemorroidectomy   . Appendectomy     History reviewed. No pertinent family history.  History  Substance Use Topics  . Smoking status: Never Smoker   . Smokeless tobacco: Not on file  . Alcohol Use: Yes     occassionally    OB History    Grav Para Term Preterm Abortions TAB SAB Ect Mult Living                  Review of Systems  All other systems reviewed and are negative.    Allergies  Sulfonamide derivatives  Home Medications   Current Outpatient Rx  Name Route Sig Dispense Refill  . BC HEADACHE POWDER PO Oral Take 1 packet by mouth daily. pain     . CALCIUM + D PO Oral Take 1 tablet by mouth 2 (two) times daily. Unsure of dosage     . ESTRADIOL 0.075 MG/24HR TD PTTW Transdermal Place 1 patch onto the skin 2 (two) times a week. Wednesday,saturday     .  LEVOTHYROXINE SODIUM 88 MCG PO TABS Oral Take 88 mcg by mouth daily.      . RED YEAST RICE PO Oral Take 2 tablets by mouth 2 (two) times daily. Unsure of dosage     . VITAMIN E 400 UNITS PO CAPS Oral Take 400 Units by mouth daily.        BP 169/82  Pulse 91  Temp(Src) 99.9 F (37.7 C) (Oral)  Resp 20  SpO2 98%  Physical Exam  Nursing note and vitals reviewed. Constitutional: She appears well-developed and well-nourished.  HENT:  Head: Normocephalic and atraumatic.  Eyes: Conjunctivae are normal. Pupils are equal, round, and reactive to light.  Neck: Trachea normal, normal range of motion and full passive range of motion without pain. Neck supple.  Cardiovascular: Normal rate, regular rhythm and normal pulses.   Pulmonary/Chest: Effort normal and breath sounds normal. Chest wall is not dull to percussion. She exhibits no tenderness, no crepitus, no edema, no deformity and no retraction.  Abdominal: Soft. Normal appearance and bowel sounds are normal.  Musculoskeletal:       Hands: Lymphadenopathy:       Head (right side): No submental, no submandibular, no tonsillar, no preauricular,  no posterior auricular and no occipital adenopathy present.       Head (left side): No submental, no submandibular, no tonsillar, no preauricular, no posterior auricular and no occipital adenopathy present.    She has no cervical adenopathy.    She has no axillary adenopathy.  Neurological: She is alert. She has normal strength.  Skin: Skin is warm, dry and intact.  Psychiatric: She has a normal mood and affect. Her speech is normal and behavior is normal. Judgment and thought content normal. Cognition and memory are normal.    ED Course  Procedures (including critical care time)  Labs Reviewed - No data to display Dg Hand Complete Right  05/21/2011  *RADIOLOGY REPORT*  Clinical Data: Right hand contusion of unknown etiology.  RIGHT HAND - COMPLETE 3+ VIEW 05/21/2011:  Comparison: None.  Findings:  Dorsal soft tissue swelling. No evidence of acute or subacute fracture or dislocation.  Well-preserved joint spaces. Well-preserved bone mineral density.  No intrinsic osseous abnormalities.  IMPRESSION: No osseous abnormality.  Original Report Authenticated By: Arnell Sieving, M.D.     No diagnosis found.    MDM  Due to the acuteness of the patients symptoms, neoplasm is highly unlikely.        Dorthula Matas, PA 05/21/11 1056

## 2011-06-24 ENCOUNTER — Other Ambulatory Visit: Payer: Self-pay | Admitting: Family Medicine

## 2011-06-24 DIAGNOSIS — T148XXA Other injury of unspecified body region, initial encounter: Secondary | ICD-10-CM

## 2011-06-24 DIAGNOSIS — R52 Pain, unspecified: Secondary | ICD-10-CM

## 2011-06-30 ENCOUNTER — Other Ambulatory Visit: Payer: BC Managed Care – PPO

## 2011-07-30 ENCOUNTER — Telehealth (INDEPENDENT_AMBULATORY_CARE_PROVIDER_SITE_OTHER): Payer: Self-pay

## 2011-07-30 ENCOUNTER — Other Ambulatory Visit (INDEPENDENT_AMBULATORY_CARE_PROVIDER_SITE_OTHER): Payer: Self-pay | Admitting: Surgery

## 2011-07-30 DIAGNOSIS — E042 Nontoxic multinodular goiter: Secondary | ICD-10-CM

## 2011-07-30 DIAGNOSIS — E041 Nontoxic single thyroid nodule: Secondary | ICD-10-CM

## 2011-07-30 NOTE — Telephone Encounter (Signed)
Patient aware that Ultrasound appointment will be scheduled at [facility].  Lab form will be mailed today. Patient will be made aware today by this note:  to call back and schedule an office visit for a physical exam ASAP.  After she receives Ultrasound date.

## 2011-08-04 ENCOUNTER — Ambulatory Visit (HOSPITAL_COMMUNITY)
Admission: RE | Admit: 2011-08-04 | Discharge: 2011-08-04 | Disposition: A | Discharge status: Home / Self Care | Payer: BC Managed Care – PPO | Source: Ambulatory Visit | Attending: Surgery | Admitting: Surgery

## 2011-08-04 DIAGNOSIS — E042 Nontoxic multinodular goiter: Secondary | ICD-10-CM | POA: Insufficient documentation

## 2011-09-16 ENCOUNTER — Encounter (INDEPENDENT_AMBULATORY_CARE_PROVIDER_SITE_OTHER): Payer: Self-pay | Admitting: Surgery

## 2011-09-20 ENCOUNTER — Encounter (INDEPENDENT_AMBULATORY_CARE_PROVIDER_SITE_OTHER): Payer: Self-pay | Admitting: Surgery

## 2011-09-20 ENCOUNTER — Ambulatory Visit (INDEPENDENT_AMBULATORY_CARE_PROVIDER_SITE_OTHER): Payer: BC Managed Care – PPO | Admitting: Surgery

## 2011-09-20 VITALS — BP 140/88 | HR 88 | Temp 97.8°F | Resp 16 | Ht 62.0 in | Wt 163.6 lb

## 2011-09-20 DIAGNOSIS — Z85850 Personal history of malignant neoplasm of thyroid: Secondary | ICD-10-CM

## 2011-09-20 MED ORDER — LEVOTHYROXINE SODIUM 88 MCG PO TABS: 88.0000 ug | ORAL_TABLET | Freq: Every day | ORAL | Status: DC

## 2011-09-20 NOTE — Progress Notes (Signed)
Visit Diagnoses: 1. 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 Lorelee New. Patient has been followed with annual ultrasound examination and TSH levels. She cannot be followed with body scans were thyroglobulin levels due to the presence of an essentially normal right thyroid lobe.  Recent ultrasound in January 2013 shows the right lobe measured 1.4 x 1.9 x 5.4 cm. It is inhomogeneous. Left lobe was surgically absent. There are 2 small nodules in the right lobe measuring 5 mm and 3 mm respectively. These are stable compared to her prior study of November 2010.  Patient denies any recent change in self-examination. She remains on Synthroid 88 mcg daily.  PERTINENT REVIEW OF SYSTEMS: Denies tremor. Denies palpitations. Complains of inability to lose weight. No dysphagia. No new masses. No changes in voice.  EXAM: HEENT: normocephalic; pupils equal and reactive; sclerae clear; dentition good; mucous membranes moist NECK:  No palpable nodules or masses; symmetric on extension; no palpable anterior or posterior cervical lymphadenopathy; no supraclavicular masses; no tenderness; incision well-healed with good cosmetic result CHEST: clear to auscultation bilaterally without rales, rhonchi, or wheezes CARDIAC: regular rate and rhythm without significant murmur; peripheral pulses are full EXT:  non-tender without edema; no deformity NEURO: no gross focal deficits; no sign of tremor   IMPRESSION: Personal history of thyroid carcinoma, no evidence of recurrent disease  PLAN: Patient I discussed all the above findings. At this point there is no sign of recurrent disease. Nodules in the right lobe are stable and did not need any further evaluation at this point. I would like to reevaluate her in 2 years. I was asked to return at that time for physical exam. We  will repeat her thyroid ultrasound prior to that office visit. The patient could should continue to have her TSH level monitored annually by her primary care physician.  Patient will notify me if there is any change in self-examination. Otherwise we will evaluate her here in the office in 2 years.  Velora Heckler, MD, FACS General & Endocrine Surgery [facility]

## 2011-09-20 NOTE — Patient Instructions (Signed)
Get TSH level once a year.  tmg

## 2011-09-21 ENCOUNTER — Other Ambulatory Visit (INDEPENDENT_AMBULATORY_CARE_PROVIDER_SITE_OTHER): Payer: Self-pay | Admitting: Surgery

## 2011-09-21 DIAGNOSIS — Z85850 Personal history of malignant neoplasm of thyroid: Secondary | ICD-10-CM

## 2011-11-19 ENCOUNTER — Encounter: Payer: Self-pay | Admitting: *Deleted

## 2011-11-23 ENCOUNTER — Ambulatory Visit (INDEPENDENT_AMBULATORY_CARE_PROVIDER_SITE_OTHER): Payer: BC Managed Care – PPO | Admitting: Gastroenterology

## 2011-11-23 ENCOUNTER — Encounter: Payer: Self-pay | Admitting: Gastroenterology

## 2011-11-23 DIAGNOSIS — K439 Ventral hernia without obstruction or gangrene: Secondary | ICD-10-CM

## 2011-11-23 DIAGNOSIS — R109 Unspecified abdominal pain: Secondary | ICD-10-CM

## 2011-11-23 DIAGNOSIS — K5730 Diverticulosis of large intestine without perforation or abscess without bleeding: Secondary | ICD-10-CM

## 2011-11-23 NOTE — Progress Notes (Signed)
This is a 61 year old Caucasian female with known extensive diverticulosis and chronic IBS. She recently had left lower quadrant pain and was diagnosed by her primary care physician is having diverticulitis, and was placed on Cipro and Flagyl for 10 days with good improvement. CT scan was not performed. Last colonoscopy was 2 years ago. Her left lower quadrant pain has resolved, and she continues with vague epigastric discomfort related to bending and lifting. Previous evaluation for any hepatobiliary disease was unremarkable including endoscopy. The patient currently is on a high fiber diet and is avoiding seeds. She has 3 normal nonbloody bowel movements a day. Appetite is good and weight is stable. She denies upper GI or hepatobiliary symptoms.  Current Medications, Allergies, Past Medical History, Past Surgical History, Family History and Social History were reviewed in [employer] record.  Pertinent Review of Systems Negative   Physical Exam: Blood pressure 132/90 Pulse 100, weight 163 pounds with a BMI of 29.84. I cannot appreciate stigmata of chronic liver disease. Abdominal exam shows no organomegaly, masses, or tenderness. She does have a ventral wall hernia. Bowel sounds are normal. Mental status is normal.  Assessment and Plan: Resolved acute diverticulitis in a patient with chronic symptomatic diverticulosis. I've urged to continue a high fiber diet with fiber supplements daily, liberal by mouth fluids, and we will see her as needed per her symptomatology. I do not think her ventral wall hernia is of significance at this time. We had a long discussion concerning diverticulosis and is management. Otherwise she is to continue medications as per her primary care physician.  Please copy her primary care physician, referring physician, and pertinent subspecialists. Encounter Diagnosis  Name Primary?  . Abdominal pain Yes

## 2011-11-23 NOTE — Patient Instructions (Signed)
Your physician has requested that you go to the basement for the following lab work before leaving today: IFOB  Diverticulosis Diverticulosis is a common condition that develops when small pouches (diverticula) form in the wall of the colon. The risk of diverticulosis increases with age. It happens more often in people who eat a low-fiber diet. Most individuals with diverticulosis have no symptoms. Those individuals with symptoms usually experience abdominal pain, constipation, or loose stools (diarrhea). HOME CARE INSTRUCTIONS   Increase the amount of fiber in your diet as directed by your caregiver or dietician. This may reduce symptoms of diverticulosis.   Your caregiver may recommend taking a dietary fiber supplement.   Drink at least 6 to 8 glasses of water each day to prevent constipation.   Try not to strain when you have a bowel movement.   Your caregiver may recommend avoiding nuts and seeds to prevent complications, although this is still an uncertain benefit.   Only take over-the-counter or prescription medicines for pain, discomfort, or fever as directed by your caregiver.  FOODS WITH HIGH FIBER CONTENT INCLUDE:  Fruits. Apple, peach, pear, tangerine, raisins, prunes.   Vegetables. Brussels sprouts, asparagus, broccoli, cabbage, carrot, cauliflower, romaine lettuce, spinach, summer squash, tomato, winter squash, zucchini.   Starchy Vegetables. Baked beans, kidney beans, lima beans, split peas, lentils, potatoes (with skin).   Grains. Whole wheat bread, brown rice, bran flake cereal, plain oatmeal, white rice, shredded wheat, bran muffins.  SEEK IMMEDIATE MEDICAL CARE IF:   You develop increasing pain or severe bloating.   You have an oral temperature above 102 F (38.9 C), not controlled by medicine.   You develop vomiting or bowel movements that are bloody or black.  Document Released: 03/18/2004 Document Revised: 06/10/2011 Document Reviewed: 11/19/2009 [facility]  Patient Information 2012 [place], [place].

## 2011-12-01 ENCOUNTER — Other Ambulatory Visit: Payer: BC Managed Care – PPO

## 2011-12-01 DIAGNOSIS — Z1211 Encounter for screening for malignant neoplasm of colon: Secondary | ICD-10-CM

## 2011-12-01 LAB — FECAL OCCULT BLOOD, IMMUNOCHEMICAL: Fecal Occult Bld: NEGATIVE

## 2011-12-23 ENCOUNTER — Telehealth: Payer: Self-pay | Admitting: Gastroenterology

## 2011-12-23 ENCOUNTER — Ambulatory Visit (INDEPENDENT_AMBULATORY_CARE_PROVIDER_SITE_OTHER)
Admission: RE | Admit: 2011-12-23 | Discharge: 2011-12-23 | Disposition: A | Discharge status: Home / Self Care | Payer: BC Managed Care – PPO | Source: Ambulatory Visit | Attending: Gastroenterology | Admitting: Gastroenterology

## 2011-12-23 DIAGNOSIS — K5792 Diverticulitis of intestine, part unspecified, without perforation or abscess without bleeding: Secondary | ICD-10-CM

## 2011-12-23 DIAGNOSIS — R1032 Left lower quadrant pain: Secondary | ICD-10-CM

## 2011-12-23 DIAGNOSIS — K5732 Diverticulitis of large intestine without perforation or abscess without bleeding: Secondary | ICD-10-CM

## 2011-12-23 MED ORDER — IOHEXOL 300 MG/ML  SOLN
100.0000 mL | Freq: Once | INTRAMUSCULAR | Status: AC | PRN
  Administered 2011-12-23: 100 mL via INTRAVENOUS

## 2011-12-23 NOTE — Telephone Encounter (Signed)
Needs ct scan first

## 2011-12-23 NOTE — Telephone Encounter (Signed)
Pt reports her sam old diverticulitis s&s; pain across entire abdomen worse on l side, no temp or diarrhea but she has been on a liquid diet for 2 days trying to rest her bowel. She states she knows when she has it and usually Cipro helps. If ordered, please call to [facility] on [address]. Please advise. Thanks.

## 2011-12-23 NOTE — Telephone Encounter (Signed)
Pt scheduled for CT scan at 4pm at [facility]; NPO 4 hrs, drink 1 bottle at 2pm and 2nd at 3pm. Pt stated understanding and will come by to pick up contrast.

## 2012-01-03 ENCOUNTER — Other Ambulatory Visit: Payer: Self-pay

## 2012-01-03 MED ORDER — HYOSCYAMINE SULFATE 0.125 MG SL SUBL: 0.1250 mg | SUBLINGUAL_TABLET | SUBLINGUAL | Status: DC | PRN

## 2012-03-31 ENCOUNTER — Institutional Professional Consult (permissible substitution): Payer: BC Managed Care – PPO | Admitting: Internal Medicine

## 2012-05-02 ENCOUNTER — Ambulatory Visit (INDEPENDENT_AMBULATORY_CARE_PROVIDER_SITE_OTHER): Payer: BC Managed Care – PPO | Admitting: Internal Medicine

## 2012-05-02 ENCOUNTER — Other Ambulatory Visit (INDEPENDENT_AMBULATORY_CARE_PROVIDER_SITE_OTHER): Payer: BC Managed Care – PPO

## 2012-05-02 ENCOUNTER — Encounter: Payer: Self-pay | Admitting: Internal Medicine

## 2012-05-02 VITALS — BP 136/96 | HR 88 | Ht 62.0 in | Wt 164.0 lb

## 2012-05-02 DIAGNOSIS — J3089 Other allergic rhinitis: Secondary | ICD-10-CM

## 2012-05-02 DIAGNOSIS — J309 Allergic rhinitis, unspecified: Secondary | ICD-10-CM

## 2012-05-02 DIAGNOSIS — J310 Chronic rhinitis: Secondary | ICD-10-CM

## 2012-05-02 DIAGNOSIS — J45909 Unspecified asthma, uncomplicated: Secondary | ICD-10-CM

## 2012-05-02 LAB — CBC WITH DIFFERENTIAL/PLATELET
Basophils Relative: 0.7 % (ref 0.0–3.0)
Eosinophils Relative: 1.1 % (ref 0.0–5.0)
Lymphocytes Relative: 36.7 % (ref 12.0–46.0)
MCV: 89.1 fl (ref 78.0–100.0)
Monocytes Absolute: 0.3 10*3/uL (ref 0.1–1.0)
Neutrophils Relative %: 55.6 % (ref 43.0–77.0)
RBC: 4.66 Mil/uL (ref 3.87–5.11)
WBC: 5.7 10*3/uL (ref 4.5–10.5)

## 2012-05-02 LAB — SEDIMENTATION RATE: Sed Rate: 28 mm/hr — ABNORMAL HIGH (ref 0–22)

## 2012-05-02 MED ORDER — MONTELUKAST SODIUM 10 MG PO TABS: 10.0000 mg | ORAL_TABLET | Freq: Every day | ORAL | Status: DC

## 2012-05-02 NOTE — Patient Instructions (Addendum)
Script for singulair/ montelukast  Continue Bates nasal cream as directed  Order- lab- Allergy Profile, CBC, ANA, sed rate     Dx chronic rhinitis, asthma

## 2012-05-02 NOTE — Progress Notes (Signed)
05/02/12- 61 yoF never smoker self referred for allergy evaluation Self referral-all year allergies; feels like nose stays stopped up and congestion.  Describes many years of itching eyes, nose and chest congestion. Previously evaluated for epistaxis. Right nostril waters. Occasionally coughs yellow. Chest is clear at times. Symptoms are not seasonal. Nasal steroids give headache. No ENT surgery. Saline nasal spray causes headache so she uses Vaseline in her nose. History of recurrent hives mostly in the left posterior axillary area, come and go. No recognized sensitivity to food or insect's. She works as a Conservation officer, nature at [employer]. Prednisone causes chest discomfort and rapid heart beat.  Prior to Admission medications   Medication Sig Start Date End Date Taking? Authorizing Provider  estradiol (VIVELLE-DOT) 0.075 MG/24HR Place 1 patch onto the skin 2 (two) times a week. Wednesday,saturday    Yes Historical Provider, MD  hyoscyamine (LEVSIN SL) 0.125 MG SL tablet Place 1 tablet (0.125 mg total) under the tongue every 4 (four) hours as needed for cramping. 01/03/12 06/01/12 Yes Mardella Layman, MD  levothyroxine (SYNTHROID, LEVOTHROID) 88 MCG tablet Take 1 tablet (88 mcg total) by mouth daily. 09/20/11  Yes Velora Heckler, MD  vitamin E 1000 UNIT capsule Take 1,000 Units by mouth 2 (two) times daily.   Yes Historical Provider, MD  montelukast (SINGULAIR) 10 MG tablet Take 1 tablet (10 mg total) by mouth at bedtime. 05/02/12 05/02/13  Waymon Budge, MD   Past Medical History  Diagnosis Date  . Asthma   . Hypothyroidism   . Hyperlipidemia   . Personal history of colonic polyps 01/20/2000    TUBULAR ADENOMA  . Diverticulosis of colon (without mention of hemorrhage)    Past Surgical History  Procedure Date  . Abdominal hysterectomy   . Tubal ligation   . Thyroidectomy, partial   . Hemorroidectomy   . Appendectomy   . Cesarean section    Family History  Problem Relation Age of Onset    . Thyroid cancer Mother   . Colon cancer Father   . Cancer Father     bladder  . Prostate cancer Brother   . Prostate cancer Brother   . Colonic polyp Brother   . Colon polyps Brother   . Diabetes Mother   . Heart disease Mother    History   Social History  . Marital Status: Divorced    Spouse Name: N/A    Number of Children: 1  . 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 Smoking status: Never Smoker   . Smokeless tobacco: Never Used  . Alcohol Use: Yes     Comment: occassionally  . Drug Use: No  . Sexually Active: Not on file   Other Topics Concern  . Not on file   Social History Narrative  . No narrative on file   ROS-see HPI Constitutional:   No-   weight loss, night sweats, fevers, chills, fatigue, lassitude. HEENT:   No-  headaches, difficulty swallowing, tooth/dental problems, sore throat, + occasional epistaxis      + sneezing, itching, ear ache, nasal congestion, post nasal drip,  CV:  No-   chest pain, orthopnea, PND, swelling in lower extremities, anasarca, dizziness, palpitations Resp: No-   shortness of breath with exertion or at rest.              No-   productive cough,  No non-productive cough,  No- coughing up of blood.              No-   change in color of mucus.  No- wheezing.   Skin: No-   rash or lesions. GI:  No-   heartburn, indigestion, abdominal pain, nausea, vomiting, diarrhea,                 change in bowel habits, loss of appetite GU: No-   dysuria, change in color of urine, no urgency or frequency.  No- flank pain. MS:  No-   joint pain or swelling.  No- decreased range of motion.  No- back pain. Neuro-     nothing unusual Psych:  No- change in mood or affect. + depression or anxiety.  No memory loss.  OBJ- Physical Exam General- Alert, Oriented, Affect-appropriate, Distress- none acute Skin- rash-none, lesions- none, excoriation- none Lymphadenopathy-  none Head- atraumatic            Eyes- Gross vision intact, PERRLA, conjunctivae and secretions clear            Ears- Hearing, canals-normal            Nose- + superficial bloody erosion right septum with mucus bridging, no polyps, no-Septal dev, polyps, erosion, perforation             Throat- Mallampati II , mucosa clear , drainage- none, tonsils- atrophic Neck- flexible , trachea midline, no stridor , thyroid nl, carotid no bruit Chest - symmetrical excursion , unlabored           Heart/CV- RRR , no murmur , no gallop  , no rub, nl s1 s2                           - JVD- none , edema- none, stasis changes- none, varices- none           Lung- clear to P&A, wheeze- none, cough- none , dullness-none, rub- none           Chest wall-  Abd- tender-no, distended-no, bowel sounds-present, HSM- no Br/ Gen/ Rectal- Not done, not indicated Extrem- cyanosis- none, clubbing, none, atrophy- none, strength- nl Neuro- grossly intact to observation

## 2012-05-03 LAB — ALLERGY FULL PROFILE
Alternaria Alternata: <0.1 kU/L
Bermuda Grass: <0.1 kU/L
Box Elder IgE: <0.1 kU/L
Candida Albicans: <0.1 kU/L
Curvularia lunata: <0.1 kU/L
Elm IgE: <0.1 kU/L
Fescue: <0.1 kU/L
G005 Rye, Perennial: <0.1 kU/L
G009 Red Top: <0.1 kU/L
Lamb's Quarters: <0.1 kU/L
Oak: <0.1 kU/L
Timothy Grass: <0.1 kU/L

## 2012-05-03 LAB — ANTI-NUCLEAR AB-TITER (ANA TITER): ANA Titer 1: NEGATIVE

## 2012-05-04 NOTE — Progress Notes (Signed)
Quick Note:  Pt aware of results. ______ 

## 2012-05-11 ENCOUNTER — Other Ambulatory Visit: Payer: Self-pay | Admitting: Family Medicine

## 2012-05-11 ENCOUNTER — Other Ambulatory Visit (HOSPITAL_COMMUNITY): Payer: Self-pay | Admitting: Family Medicine

## 2012-05-11 DIAGNOSIS — M542 Cervicalgia: Secondary | ICD-10-CM

## 2012-05-13 ENCOUNTER — Encounter: Payer: Self-pay | Admitting: Internal Medicine

## 2012-05-13 DIAGNOSIS — J302 Other seasonal allergic rhinitis: Secondary | ICD-10-CM | POA: Insufficient documentation

## 2012-05-13 NOTE — Assessment & Plan Note (Signed)
Plan-C. to clarify allergic versus nonallergic rhinitis pattern. Ordering CBC and allergy profile. Continue saline nasal rinse. Singulair.

## 2012-05-16 ENCOUNTER — Ambulatory Visit (HOSPITAL_COMMUNITY)
Admission: RE | Admit: 2012-05-16 | Discharge: 2012-05-16 | Disposition: A | Discharge status: Home / Self Care | Payer: BC Managed Care – PPO | Source: Ambulatory Visit | Attending: Family Medicine | Admitting: Family Medicine

## 2012-05-16 ENCOUNTER — Other Ambulatory Visit (HOSPITAL_COMMUNITY): Payer: BC Managed Care – PPO

## 2012-05-16 DIAGNOSIS — M5020 Other cervical disc displacement, unspecified cervical region: Secondary | ICD-10-CM | POA: Insufficient documentation

## 2012-05-16 DIAGNOSIS — M542 Cervicalgia: Secondary | ICD-10-CM

## 2012-06-09 ENCOUNTER — Other Ambulatory Visit: Payer: Self-pay | Admitting: Otolaryngology

## 2012-06-09 DIAGNOSIS — R42 Dizziness and giddiness: Secondary | ICD-10-CM

## 2012-06-19 ENCOUNTER — Other Ambulatory Visit (HOSPITAL_COMMUNITY): Payer: Self-pay | Admitting: Otolaryngology

## 2012-06-19 DIAGNOSIS — R42 Dizziness and giddiness: Secondary | ICD-10-CM

## 2012-06-22 ENCOUNTER — Ambulatory Visit (HOSPITAL_COMMUNITY)
Admission: RE | Admit: 2012-06-22 | Discharge: 2012-06-22 | Payer: BC Managed Care – PPO | Source: Ambulatory Visit | Attending: Otolaryngology | Admitting: Otolaryngology

## 2012-06-23 ENCOUNTER — Ambulatory Visit: Payer: BC Managed Care – PPO | Admitting: Internal Medicine

## 2012-06-27 ENCOUNTER — Ambulatory Visit: Payer: BC Managed Care – PPO | Admitting: Internal Medicine

## 2012-07-07 ENCOUNTER — Ambulatory Visit: Payer: Self-pay | Admitting: Internal Medicine

## 2012-07-13 ENCOUNTER — Ambulatory Visit (HOSPITAL_COMMUNITY)
Admission: RE | Admit: 2012-07-13 | Discharge: 2012-07-13 | Disposition: A | Discharge status: Home / Self Care | Payer: BC Managed Care – PPO | Source: Ambulatory Visit | Attending: Otolaryngology | Admitting: Otolaryngology

## 2012-07-13 DIAGNOSIS — F29 Unspecified psychosis not due to a substance or known physiological condition: Secondary | ICD-10-CM | POA: Insufficient documentation

## 2012-07-13 DIAGNOSIS — R42 Dizziness and giddiness: Secondary | ICD-10-CM

## 2012-07-13 DIAGNOSIS — H9319 Tinnitus, unspecified ear: Secondary | ICD-10-CM | POA: Insufficient documentation

## 2012-07-13 DIAGNOSIS — R040 Epistaxis: Secondary | ICD-10-CM | POA: Insufficient documentation

## 2012-07-13 LAB — BUN: BUN: 12 mg/dL (ref 6–23)

## 2012-07-13 MED ORDER — GADOBENATE DIMEGLUMINE 529 MG/ML IV SOLN
15.0000 mL | Freq: Once | INTRAVENOUS | Status: AC
  Administered 2012-07-13: 15 mL via INTRAVENOUS

## 2012-07-24 ENCOUNTER — Ambulatory Visit: Payer: Self-pay | Admitting: Internal Medicine

## 2012-07-27 ENCOUNTER — Ambulatory Visit: Payer: Self-pay | Admitting: Internal Medicine

## 2012-08-21 ENCOUNTER — Telehealth (INDEPENDENT_AMBULATORY_CARE_PROVIDER_SITE_OTHER): Payer: Self-pay | Admitting: General Surgery

## 2012-08-21 NOTE — Telephone Encounter (Signed)
Pt called to ask for appt with Dr. Gerrit Friends.  She has seen her PCP twice for a pain under her Lt breast that goes through to her back.  The PCP has given her Flexeril without any improvement.  Explained that Dr. Gerrit Friends is a general surgeon and there is no reason to see a surgeon for these symptoms at this time.  Suggested she get an appt with her OB-GYN for breast evaluation, since she thinks it may involve her breast.  If the gyn work-up indicates a surgical intervention is required, [facility] will be glad to schedule an appt at that time.  Pt understands; she just wanted an MD to figure out why she is in pain.

## 2012-08-22 ENCOUNTER — Ambulatory Visit (HOSPITAL_BASED_OUTPATIENT_CLINIC_OR_DEPARTMENT_OTHER): Payer: BC Managed Care – PPO

## 2012-08-23 ENCOUNTER — Other Ambulatory Visit (INDEPENDENT_AMBULATORY_CARE_PROVIDER_SITE_OTHER): Payer: BC Managed Care – PPO

## 2012-08-23 ENCOUNTER — Telehealth: Payer: Self-pay | Admitting: Gastroenterology

## 2012-08-23 ENCOUNTER — Encounter: Payer: Self-pay | Admitting: Gastroenterology

## 2012-08-23 ENCOUNTER — Ambulatory Visit (INDEPENDENT_AMBULATORY_CARE_PROVIDER_SITE_OTHER): Payer: BC Managed Care – PPO | Admitting: Gastroenterology

## 2012-08-23 VITALS — BP 150/100 | HR 119 | Ht 62.0 in | Wt 165.0 lb

## 2012-08-23 DIAGNOSIS — K5732 Diverticulitis of large intestine without perforation or abscess without bleeding: Secondary | ICD-10-CM

## 2012-08-23 DIAGNOSIS — R109 Unspecified abdominal pain: Secondary | ICD-10-CM

## 2012-08-23 DIAGNOSIS — K625 Hemorrhage of anus and rectum: Secondary | ICD-10-CM

## 2012-08-23 LAB — COMPREHENSIVE METABOLIC PANEL
Alkaline Phosphatase: 34 U/L — ABNORMAL LOW (ref 39–117)
CO2: 29 mEq/L (ref 19–32)
Creatinine, Ser: 0.6 mg/dL (ref 0.4–1.2)
GFR: 110 mL/min (ref >60.00)
Glucose, Bld: 106 mg/dL — ABNORMAL HIGH (ref 70–99)
Sodium: 138 mEq/L (ref 135–145)
Total Bilirubin: 0.4 mg/dL (ref 0.3–1.2)
Total Protein: 7.8 g/dL (ref 6.0–8.3)

## 2012-08-23 LAB — CBC WITH DIFFERENTIAL/PLATELET
Eosinophils Relative: 1 % (ref 0.0–5.0)
HCT: 41 % (ref 36.0–46.0)
Hemoglobin: 14 g/dL (ref 12.0–15.0)
Lymphs Abs: 2.2 10*3/uL (ref 0.7–4.0)
MCV: 87.4 fl (ref 78.0–100.0)
Monocytes Absolute: 0.3 10*3/uL (ref 0.1–1.0)
Neutro Abs: 2.8 10*3/uL (ref 1.4–7.7)
Platelets: 249 10*3/uL (ref 150.0–400.0)
RDW: 12.7 % (ref 11.5–14.6)
WBC: 5.4 10*3/uL (ref 4.5–10.5)

## 2012-08-23 MED ORDER — CIPROFLOXACIN HCL 500 MG PO TABS: 500.0000 mg | ORAL_TABLET | Freq: Two times a day (BID) | ORAL | Status: DC

## 2012-08-23 MED ORDER — MESALAMINE 1.2 G PO TBEC: 4.8000 g | DELAYED_RELEASE_TABLET | Freq: Every day | ORAL | Status: DC

## 2012-08-23 NOTE — Telephone Encounter (Signed)
Pt c/o rectal bleeding x 1 day on tp and in the bowl. She also reports pain in her gut that goes all the way to her back. The pain is located 3 inches above her waist and ha been going on for > 3 weeks; pcp gave her a muscle relaxer that did not help. Also c/o rectal pain and states she is not constipated. Pt will see Doug Sou, PA today.

## 2012-08-23 NOTE — Progress Notes (Signed)
08/23/2012 Linda Wang [id number] 02-Aug-1950   History of Present Illness: Patient is a pleasant 62 year old female who is a patient of Dr. Norval Gable.  She has history of recurrent diverticulitis.  Last CT scan in 12/2011 showed mild wall thickening likely due to chronic diverticular changes.  She comes in today complaining of left-sided abdominal pain (left mid-abdomen) that is radiating into her back and has been present and constant for the last 3 weeks.  Says that BMs are normal.  No nausea, vomiting, fevers, or urinary symptoms.  Saw a small amount of blood on the TP this AM for the first time.  Last colonoscopy 11/2009 showed severe sigmoid diverticulitis and was otherwise normal.  She has been taking levsin but says that it is not helping.  Pain keeps her up at night but she has still been able to work during the day; makes her feel "wiped out", however.  Her BP is high today and she is tachycardic, but she says that these are always normal at home; she check them twice a day to keep a record for her PCP to confirm "white-coat syndrome".   Current Medications, Allergies, Past Medical History, Past Surgical History, Family History and Social History were reviewed in [employer] record.   Physical Exam: BP 150/100  Pulse 119  Ht 5\' 2"  (1.575 m)  Wt 165 lb (74.844 kg)  BMI 30.17 kg/m2  SpO2 98% General: Well developed, white female in no acute distress Head: Normocephalic and atraumatic Eyes:  sclerae anicteric, conjunctiva pink  Ears: Normal auditory acuity Lungs: Clear throughout to auscultation Heart: Tachy with regular rhythm Abdomen: Soft, non-distended. No masses, no hepatomegaly. Normal bowel sounds.  Tenderness all along the left side of the abdomen without R/R/G. Musculoskeletal: Symmetrical with no gross deformities  Extremities: No edema  Neurological: Alert oriented x 4, grossly nonfocal Psychological:  Alert and cooperative. Normal mood and  affect  Assessment and Recommendations: -Left sided abdominal pain:  Suspect diverticulitis.  Has experienced multiple episodes of this in the past.  Will treat with cipro 500 mg BID.  I will also start her on Lialda 4.8 grams daily (can be decreased to 2.4 grams in the future if desired) to possibly help with some chronic inflammatory issues from recurrent episodes of diverticulitis.  May need to revisit option of surgery in future as well.  Follow-up with Dr. Jarold Motto in 4 weeks.  She is to call if her symptoms worsen, or go to the ER if she develops nausea, vomiting, fevers, or significantly worse pain.

## 2012-08-23 NOTE — Patient Instructions (Addendum)
We have sent the following medications to your pharmacy for you to pick up at your convenience: Cipro and Lialda  Your physician has requested that you go to the basement for the following lab work before leaving today: CBC and CMET Follow up with Dr Jarold Motto in 4 weeks. CC:  Adaku Nnodi MD

## 2012-08-29 ENCOUNTER — Ambulatory Visit: Payer: BC Managed Care – PPO | Admitting: Gastroenterology

## 2012-09-26 ENCOUNTER — Ambulatory Visit (INDEPENDENT_AMBULATORY_CARE_PROVIDER_SITE_OTHER): Payer: BC Managed Care – PPO | Admitting: Gastroenterology

## 2012-09-26 ENCOUNTER — Encounter: Payer: Self-pay | Admitting: Gastroenterology

## 2012-09-26 VITALS — BP 130/90 | HR 89 | Wt 166.2 lb

## 2012-09-26 DIAGNOSIS — K5010 Crohn's disease of large intestine without complications: Secondary | ICD-10-CM

## 2012-09-26 DIAGNOSIS — R109 Unspecified abdominal pain: Secondary | ICD-10-CM

## 2012-09-26 DIAGNOSIS — K5731 Diverticulosis of large intestine without perforation or abscess with bleeding: Secondary | ICD-10-CM

## 2012-09-26 MED ORDER — HYDROCODONE-ACETAMINOPHEN 5-500 MG PO TABS: 1.0000 | ORAL_TABLET | ORAL | Status: DC | PRN

## 2012-09-26 MED ORDER — MOVIPREP 100 G PO SOLR: 1.0000 | Freq: Once | ORAL | Status: DC

## 2012-09-26 NOTE — Patient Instructions (Addendum)
You have been scheduled for a colonoscopy with propofol. Please follow written instructions given to you at your visit today.  Please pick up your prep kit at the pharmacy within the next 1-3 days. If you use inhalers (even only as needed), please bring them with you on the day of your procedure.  You were given a prescription for Hydrocodone.   __________________________________________________________________________________________________  Linda Wang have been scheduled for a CT scan of the abdomen and pelvis at [facility] ([address]---this is in the same building as Architectural technologist).   You are scheduled on 09-27-2012 at 2:30 PM. You should arrive 15 minutes prior to your appointment time for registration. Please follow the written instructions below on the day of your exam:  WARNING: IF YOU ARE ALLERGIC TO IODINE/X-RAY DYE, PLEASE NOTIFY RADIOLOGY IMMEDIATELY AT [phone]! YOU WILL BE GIVEN A 13 HOUR PREMEDICATION PREP.  1) Do not eat or drink anything after 10:30 am(4 hours prior to your test) 2) You have been given 2 bottles of oral contrast to drink. The solution may taste better if refrigerated, but do NOT add ice or any other liquid to this solution. Shake well before drinking.    Drink 1 bottle of contrast @ 12:30 PM (2 hours prior to your exam)  Drink 1 bottle of contrast @ 1:30 PM (1 hour prior to your exam)  You may take any medications as prescribed with a small amount of water except for the following: Metformin, Glucophage, Glucovance, Avandamet, Riomet, Fortamet, Actoplus Met, Janumet, Glumetza or Metaglip. The above medications must be held the day of the exam AND 48 hours after the exam.  The purpose of you drinking the oral contrast is to aid in the visualization of your intestinal tract. The contrast solution may cause some diarrhea. Before your exam is started, you will be given a small amount of fluid to drink. Depending on your individual set of  symptoms, you may also receive an intravenous injection of x-ray contrast/dye. Plan on being at [facility] for 30 minutes or long, depending on the type of exam you are having performed.  This test typically takes 30-45 minutes to complete.  If you have any questions regarding your exam or if you need to reschedule, you may call the CT department at [phone] between the hours of 8:00 am and 5:00 pm, Monday-Friday.  ________________________________________________________________________

## 2012-09-26 NOTE — Progress Notes (Signed)
This is a nice 62 year old Caucasian female who has had recurrent diverticulitis for many years.  She last was treated in August with CT scan confirmed acute left colon diverticulitis.  She has had persistent left lower quadrant and left upper quadrant pain for the last 8 weeks unresponsive to antibiotics.  She is continued tenesmus, but denies true diarrhea, rectal bleeding, or systemic complaints.  Her pain is 8/10 and has no real precipitating or alleviating elements.  She has mild nausea but no emesis, other upper GI or hepatobiliary complaints.  She is status post appendectomy by Dr. Gerrit Friends 2003.  She denies fever, chills, or other systemic complaints.  She is on a high fiber diet with supplemental probiotics and yogurt.  Attempts to treat her with Lialda have not resulted in any alleviation of her symptoms.  Last colonoscopy 2011 showed severe diverticulosis with segmental colitis.  Current Medications, Allergies, Past Medical History, Past Surgical History, Family History and Social History were reviewed in [employer] record.  ROS: All systems were reviewed and are negative unless otherwise stated in the HPI.          Physical Exam: Blood pressure 130/90, pulse 89 and regular and weight 166 with a BMI of 30.39.Marland Kitchen  Cannot appreciate stigmata of chronic liver disease.  Her abdomen shows mild distention but no organomegaly, masses or tenderness.  Specks of rectum shows some redundant skin tissue but no active hemorrhoids, fissures or fistulae.  Rectal exam shows no masses, tenderness, and soft stool which is guaiac negative.  Mental status is normal.patient in no distress.  Blood pressure 130/90, pulse 89 and regular and weight 166 with a BMI of 30    Assessment and plan: Symptomatically diverticulosis with segmental colitis.  She probably will need surgical resection per Dr. Gerrit Friends.  I have ordered repeat CT scan and repeat colonoscopy.  She is to continue her high fiber  diet with fiber supplements, and I prescribed when necessary hydrocodone 5 mg every 6-8 hours as tolerated be careful about constipation.  His continue other medications as listed and reviewed.  Please copy this to Dr. Darnell Level at [facility]. Encounter Diagnosis  Name Primary?  . Abdominal pain, unspecified site Yes

## 2012-09-27 ENCOUNTER — Ambulatory Visit (INDEPENDENT_AMBULATORY_CARE_PROVIDER_SITE_OTHER)
Admission: RE | Admit: 2012-09-27 | Discharge: 2012-09-27 | Disposition: A | Discharge status: Home / Self Care | Payer: BC Managed Care – PPO | Source: Ambulatory Visit | Attending: Gastroenterology | Admitting: Gastroenterology

## 2012-09-27 ENCOUNTER — Telehealth: Payer: Self-pay | Admitting: Gastroenterology

## 2012-09-27 ENCOUNTER — Encounter: Payer: Self-pay | Admitting: Gastroenterology

## 2012-09-27 DIAGNOSIS — R109 Unspecified abdominal pain: Secondary | ICD-10-CM

## 2012-09-27 MED ORDER — IOHEXOL 300 MG/ML  SOLN
100.0000 mL | Freq: Once | INTRAMUSCULAR | Status: AC | PRN
  Administered 2012-09-27: 100 mL via INTRAVENOUS

## 2012-09-27 MED ORDER — HYDROCODONE-ACETAMINOPHEN 5-325 MG PO TABS
1.0000 | ORAL_TABLET | Freq: Four times a day (QID) | ORAL | Status: DC | PRN
Start: 2012-09-27 — End: 2012-10-23

## 2012-09-27 NOTE — Telephone Encounter (Signed)
CALLED PHARMACY, NEW PRESCRIPTION FAXED

## 2012-09-27 NOTE — Telephone Encounter (Signed)
CALLED PATIENT AND LEFT A MESSAGE FOR A CALL BACK.  I SPOKE WITH [facility] PHARMACIST AND PRESCRIPTION IS THERE AND READY FOR PICK UP.  WAITING ON PATIENTS CALL BACK.

## 2012-09-28 ENCOUNTER — Other Ambulatory Visit: Payer: Self-pay | Admitting: *Deleted

## 2012-09-28 ENCOUNTER — Telehealth: Payer: Self-pay | Admitting: *Deleted

## 2012-09-28 NOTE — Telephone Encounter (Signed)
Informed pt of  CT results and he wants her COLON asap. Pt states he told her she needed a COLON next week, but were told there were none available. We were able to schedule pt for 10/04/12 at 3pm. Pt stated understanding.

## 2012-09-28 NOTE — Telephone Encounter (Signed)
lmom for pt to call back. Pt already has a COLON scheduled for 10/25/12.

## 2012-09-28 NOTE — Telephone Encounter (Signed)
Message copied by Florene Glen on Thu Sep 28, 2012  1:08 PM ------      Message from: Jarold Motto, DAVID R      Created: Thu Sep 28, 2012  8:31 AM       Colonoscopy ASAP ------

## 2012-10-04 ENCOUNTER — Ambulatory Visit (AMBULATORY_SURGERY_CENTER): Payer: BC Managed Care – PPO | Admitting: Gastroenterology

## 2012-10-04 ENCOUNTER — Encounter: Payer: Self-pay | Admitting: Gastroenterology

## 2012-10-04 ENCOUNTER — Telehealth: Payer: Self-pay | Admitting: *Deleted

## 2012-10-04 VITALS — BP 129/77 | HR 94 | Temp 98.2°F | Resp 19 | Ht 62.0 in | Wt 166.0 lb

## 2012-10-04 DIAGNOSIS — K5730 Diverticulosis of large intestine without perforation or abscess without bleeding: Secondary | ICD-10-CM

## 2012-10-04 DIAGNOSIS — R1012 Left upper quadrant pain: Secondary | ICD-10-CM

## 2012-10-04 DIAGNOSIS — R109 Unspecified abdominal pain: Secondary | ICD-10-CM

## 2012-10-04 DIAGNOSIS — Z1211 Encounter for screening for malignant neoplasm of colon: Secondary | ICD-10-CM

## 2012-10-04 HISTORY — PX: COLONOSCOPY: SHX174

## 2012-10-04 MED ORDER — SODIUM CHLORIDE 0.9 % IV SOLN: 500.0000 mL | INTRAVENOUS | Status: DC

## 2012-10-04 NOTE — Telephone Encounter (Signed)
Informed Krisitin in [facility] pt's appt is 11/13/12, be there at 3:50pm. Later appt d/t pt needed after 2pm.

## 2012-10-04 NOTE — Patient Instructions (Addendum)
Discharge instructions given with verbal understanding. Handouts on diverticulosis and a high fiber diet given. Resume previous medications.YOU HAD AN ENDOSCOPIC PROCEDURE TODAY AT THE [facility]: Refer to the procedure report that was given to you for any specific questions about what was found during the examination.  If the procedure report does not answer your questions, please call your gastroenterologist to clarify.  If you requested that your care partner not be given the details of your procedure findings, then the procedure report has been included in a sealed envelope for you to review at your convenience later.  YOU SHOULD EXPECT: Some feelings of bloating in the abdomen. Passage of more gas than usual.  Walking can help get rid of the air that was put into your GI tract during the procedure and reduce the bloating. If you had a lower endoscopy (such as a colonoscopy or flexible sigmoidoscopy) you may notice spotting of blood in your stool or on the toilet paper. If you underwent a bowel prep for your procedure, then you may not have a normal bowel movement for a few days.  DIET: Your first meal following the procedure should be a light meal and then it is ok to progress to your normal diet.  A half-sandwich or bowl of soup is an example of a good first meal.  Heavy or fried foods are harder to digest and may make you feel nauseous or bloated.  Likewise meals heavy in dairy and vegetables can cause extra gas to form and this can also increase the bloating.  Drink plenty of fluids but you should avoid alcoholic beverages for 24 hours.  ACTIVITY: Your care partner should take you home directly after the procedure.  You should plan to take it easy, moving slowly for the rest of the day.  You can resume normal activity the day after the procedure however you should NOT DRIVE or use heavy machinery for 24 hours (because of the sedation medicines used during the test).    SYMPTOMS TO  REPORT IMMEDIATELY: A gastroenterologist can be reached at any hour.  During normal business hours, 8:30 AM to 5:00 PM Monday through Friday, call [phone].  After hours and on weekends, please call the GI answering service at [phone] who will take a message and have the physician on call contact you.   Following lower endoscopy (colonoscopy or flexible sigmoidoscopy):  Excessive amounts of blood in the stool  Significant tenderness or worsening of abdominal pains  Swelling of the abdomen that is new, acute  Fever of 100F or higher  FOLLOW UP: If any biopsies were taken you will be contacted by phone or by letter within the next 1-3 weeks.  Call your gastroenterologist if you have not heard about the biopsies in 3 weeks.  Our staff will call the home number listed on your records the next business day following your procedure to check on you and address any questions or concerns that you may have at that time regarding the information given to you following your procedure. This is a courtesy call and so if there is no answer at the home number and we have not heard from you through the emergency physician on call, we will assume that you have returned to your regular daily activities without incident.  SIGNATURES/CONFIDENTIALITY: You and/or your care partner have signed paperwork which will be entered into your electronic medical record.  These signatures attest to the fact that that the information above on your   After Visit Summary has been reviewed and is understood.  Full responsibility of the confidentiality of this discharge information lies with you and/or your care-partner.   

## 2012-10-04 NOTE — Progress Notes (Signed)
Patient did not experience any of the following events: a burn prior to discharge; a fall within the facility; wrong site/side/patient/procedure/implant event; or a hospital transfer or hospital admission upon discharge from the facility. (G8907) Patient did not have preoperative order for IV antibiotic SSI prophylaxis. (G8918)  

## 2012-10-04 NOTE — Op Note (Signed)
[facility] [address] [place], [address]   COLONOSCOPY PROCEDURE REPORT  PATIENT: Linda Wang, Linda Wang  MR#: [id number] BIRTHDATE: [birth year] , 61  yrs. old GENDER: Female ENDOSCOPIST: Mardella Layman, MD, [facility] REFERRED BY: PROCEDURE DATE:  10/04/2012 PROCEDURE:   Colonoscopy, screening ASA CLASS:   Class II INDICATIONS:Average risk patient for colon cancer, abdominal pain in the lower left quadrant, and abdominal pain in the upper left quadrant. MEDICATIONS: propofol (Diprivan) 300mg  IV  DESCRIPTION OF PROCEDURE:   After the risks and benefits and of the procedure were explained, informed consent was obtained.  A digital rectal exam revealed external hemorrhoids.    The LB CF-[id number]  endoscope was introduced through the anus and advanced to the cecum, which was identified by both the appendix and ileocecal valve .  The quality of the prep was excellent, using MoviPrep . The instrument was then slowly withdrawn as the colon was fully examined.     COLON FINDINGS: There was severe diverticulosis noted in the descending colon and sigmoid colon with associated tortuosity, angulation, luminal narrowing, colonic spasm and petechiae.   The colon was otherwise normal.  There was no diverticulosis, inflammation, polyps or cancers unless previously stated. Retroflexed views revealed external hemorrhoids.     The scope was then withdrawn from the patient and the procedure completed.  COMPLICATIONS: There were no complications. ENDOSCOPIC IMPRESSION: 1.   There was severe diverticulosis noted in the descending colon and sigmoid colon ...symptomatic" MYOCHOSIS COLI" 2.   The colon was otherwise normal ..no polyps noted...  RECOMMENDATIONS: Continue current medications Surgical referral for sigmoid colectomy  REPEAT EXAM:  MV:HQIONG Gross, MD  _______________________________ eSigned:  Mardella Layman, MD, [facility] 10/04/2012 3:13 PM     PATIENT  NAME:  Linda Wang MR#: [id number]

## 2012-10-05 ENCOUNTER — Telehealth: Payer: Self-pay | Admitting: *Deleted

## 2012-10-05 ENCOUNTER — Telehealth (INDEPENDENT_AMBULATORY_CARE_PROVIDER_SITE_OTHER): Payer: Self-pay

## 2012-10-05 NOTE — Telephone Encounter (Signed)
  Follow up Call-  Call back number 10/04/2012  Post procedure Call Back phone  # [phone]  Permission to leave phone message Yes     Patient questions:  Do you have a fever, pain , or abdominal swelling? no Pain Score  0 *  Have you tolerated food without any problems? yes  Have you been able to return to your normal activities? yes  Do you have any questions about your discharge instructions: Diet   no Medications  no Follow up visit  no  Do you have questions or concerns about your Care? yes  Actions: * If pain score is 4 or above: No action needed, pain <4.  Patient states she could not sleep when she got home yesterday or lastnight. She does have headache, she had normal BM,denies any blood,fever or abdomen pain. Encouraged patient to eat and drink a lot of fluids today and rest. Also told patient that she may take OTC meds. For headache. Patient understands. Encouraged patient to call us back if she does not improve.

## 2012-10-05 NOTE — Telephone Encounter (Signed)
The pt called to report she has an appointment with Dr Gerrit Friends in May.  She was referred by Dr Jarold Motto.  She has seen Dr Gerrit Friends before.  She would like to be seen sooner.  She has pain all the time in her colon.  She has no fever.  She works from 9-1:30 and can come in the afternoon.  Please review the schedule and let her know if you can see her sooner.

## 2012-10-06 ENCOUNTER — Ambulatory Visit (INDEPENDENT_AMBULATORY_CARE_PROVIDER_SITE_OTHER): Payer: BC Managed Care – PPO | Admitting: Surgery

## 2012-10-06 ENCOUNTER — Encounter (INDEPENDENT_AMBULATORY_CARE_PROVIDER_SITE_OTHER): Payer: Self-pay | Admitting: Surgery

## 2012-10-06 VITALS — HR 88 | Temp 97.4°F | Resp 16 | Ht 62.0 in | Wt 165.0 lb

## 2012-10-06 DIAGNOSIS — K5730 Diverticulosis of large intestine without perforation or abscess without bleeding: Secondary | ICD-10-CM

## 2012-10-06 DIAGNOSIS — R1084 Generalized abdominal pain: Secondary | ICD-10-CM

## 2012-10-06 NOTE — Patient Instructions (Signed)
[facility]  OPEN ABDOMINAL SURGERY: POST OP INSTRUCTIONS  Always review your discharge instruction sheet given to you by the facility where your surgery was performed.  1. A prescription for pain medication may be given to you upon discharge.  Take your pain medication as prescribed.  If narcotic pain medicine is not needed, then you may take acetaminophen (Tylenol) or ibuprofen (Advil) as needed. 2. Take your usually prescribed medications unless otherwise directed. 3. If you need a refill on your pain medication, please contact your pharmacy. They will contact our office to request authorization.  Prescriptions will not be filled after 5 pm or on weekends. 4. You should follow a light diet the first few days after arrival home, such as soup and crackers, unless your doctor has advised otherwise. A high-fiber, low fat diet can be resumed as tolerated.  Be sure to include plenty of fluids daily.  5. Most patients will experience some swelling and bruising in the area of the incision. Ice packs will help. Swelling and bruising can take several days to resolve. 6. It is common to experience some constipation if taking pain medication after surgery.  Increasing fluid intake and taking a stool softener will usually help or prevent this problem from occurring.  A mild laxative (Milk of Magnesia or Miralax) should be taken according to package directions if there are no bowel movements after 48 hours. 7.  You may have steri-strips (small skin tapes) in place directly over the incision.  These strips should be left on the skin for 7-10 days.  If your surgeon used skin glue on the incision, you may shower in 24 hours.  The glue will flake off over the next 2-3 weeks.  Any sutures or staples will be removed at the office during your follow-up visit. You may find that a light gauze bandage over your incision may keep your staples from being rubbed or pulled. You may shower and replace the bandage  daily. 8. ACTIVITIES:  You may resume regular (light) daily activities beginning the next day-such as daily self-care, walking, climbing stairs-gradually increasing activities as tolerated.  You may have sexual intercourse when it is comfortable.  Refrain from any heavy lifting or straining until approved by your doctor.  You may drive when you no longer are taking prescription pain medication, you can comfortably wear a seatbelt, and you can safely maneuver your car and apply brakes. 9. You should see your doctor in the office for a follow-up appointment approximately two weeks after your surgery.  Make sure that you call for this appointment within a day or two after you arrive home to insure a convenient appointment time.  WHEN TO CALL YOUR DOCTOR: 1. Fever greater than 101.0 2. Inability to urinate 3. Persistent nausea and/or vomiting 4. Extreme swelling or bruising 5. Continued bleeding from incision 6. Increased pain, redness, or drainage from the incision 7. Difficulty swallowing or breathing 8. Muscle cramping or spasms 9. Numbness or tingling in hands or around lips  IF YOU HAVE DISABILITY OR FAMILY LEAVE FORMS, YOU MUST BRING THEM TO THE OFFICE FOR PROCESSING.  PLEASE DO NOT GIVE THEM TO YOUR DOCTOR.  The clinic staff is available to answer your questions during regular business hours.  Please don't hesitate to call and ask to speak to one of the nurses if you have concerns.  [facility] Office: [phone]  For further questions, please visit [email or web address]   

## 2012-10-06 NOTE — Progress Notes (Signed)
General Surgery - [facility]  Chief Complaint  Patient presents with  . New Evaluation    evaluate diverticular disease for resection - referral from Dr. David Patterson, [facility]    HISTORY: Patient is a 61-year-old white female well known to my surgical practice from her history of thyroidectomy. Patient is referred at this time for management of chronic diverticular disease. Patient has had intermittent episodes of diverticulitis dating back over 10 years. She has been on oral antibiotic therapy multiple times. Recently she has left lower quadrant abdominal pain radiating to the back on a frequent basis. She notes irregular bowel movements. Patient underwent CT scan of the abdomen followed by a colonoscopy 2 days ago. This shows severe diverticular disease involving the distal descending colon and sigmoid colon. This is described in a report by her gastroenterologist. She is referred at this time for consideration for resection of the distal descending colon and sigmoid colon for management of chronic diverticular disease and abdominal pain.  Past Medical History  Diagnosis Date  . Asthma   . Hypothyroidism   . Hyperlipidemia   . Personal history of colonic polyps 01/20/2000    TUBULAR ADENOMA  . Diverticulosis of colon (without mention of hemorrhage)      Current Outpatient Prescriptions  Medication Sig Dispense Refill  . Calcium-Vitamin D-Vitamin K (CALCIUM + D + K PO) Take 1 capsule by mouth daily.      . estradiol (VIVELLE-DOT) 0.075 MG/24HR Place 1 patch onto the skin 2 (two) times a week. Wednesday,saturday       . fish oil-omega-3 fatty acids 1000 MG capsule Take 2 g by mouth daily.      . HYDROcodone-acetaminophen (NORCO/VICODIN) 5-325 MG per tablet Take 1 tablet by mouth every 6 (six) hours as needed for pain.  30 tablet  0  . levothyroxine (SYNTHROID, LEVOTHROID) 88 MCG tablet Take 1 tablet (88 mcg total) by mouth daily.  90 tablet  3  . mesalamine  (LIALDA) 1.2 G EC tablet Take 1,200 mg by mouth daily with breakfast. 4 once a day      . Red Yeast Rice Extract (RED YEAST RICE PO) Take 1 capsule by mouth 2 (two) times daily.      . vitamin E 1000 UNIT capsule Take 1,000 Units by mouth 2 (two) times daily.       No current facility-administered medications for this visit.     Allergies  Allergen Reactions  . Statins     Give pt GERD  . Sulfonamide Derivatives Hives     Family History  Problem Relation Age of Onset  . Thyroid cancer Mother   . Colon cancer Father   . Cancer Father     bladder  . Prostate cancer Brother   . Prostate cancer Brother   . Colonic polyp Brother   . Colon polyps Brother   . Diabetes Mother   . Heart disease Mother      History   Social History  . Marital Status: Divorced    Spouse Name: N/A    Number of Children: 1  . 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 Smoking status: Never Smoker   . Smokeless tobacco: Never Used  . Alcohol Use: Yes     Comment: occassionally  . Drug Use: No  . Sexually Active: None   Other Topics Concern  . None   Social   History Narrative  . None     REVIEW OF SYSTEMS - PERTINENT POSITIVES ONLY: Patient reports 3-4 bowel movements daily. She does note urgency. She denies bleeding direct him.  EXAM: Filed Vitals:   10/06/12 1419  Pulse: 88  Temp: 97.4 F (36.3 C)  Resp: 16    HEENT: normocephalic; pupils equal and reactive; sclerae clear; dentition good; mucous membranes moist NECK:  Well-healed surgical incision; symmetric on extension; no palpable anterior or posterior cervical lymphadenopathy; no supraclavicular masses; no tenderness CHEST: clear to auscultation bilaterally without rales, rhonchi, or wheezes CARDIAC: regular rate and rhythm without significant murmur; peripheral pulses are full ABDOMEN: soft without distension; bowel sounds present; no mass;  no hepatosplenomegaly; no hernia; well-healed laparoscopic incisions; mild to moderate tenderness left lower quadrant without guarding or mass EXT:  non-tender without edema; no deformity NEURO: no gross focal deficits; no sign of tremor   LABORATORY RESULTS: See [facility] (CHL-Epic) for most recent results   RADIOLOGY RESULTS: See [facility] (CHL-Epic) for most recent results   IMPRESSION: Diverticular disease, chronic  PLAN: The patient, her significant other, and myself had a lengthy discussion regarding the above findings. We reviewed the results of her recent CT scan and of her colonoscopy. We discussed options for management. These include both nonoperative and operative strategies.  After careful consideration, the patient would like to proceed with sigmoid colectomy. This was recommended by her gastroenterologist. We discussed the procedure. We discussed the hospital stay to be anticipated. We discussed the postoperative recovery and return to activity and work. We discussed potential complications including bleeding, infection, need for colostomy, and injury to other organs. She understands and wishes to proceed with surgery. We will make arrangements for her procedure in the near future at a time convenient for her.  The risks and benefits of the procedure have been discussed at length with the patient.  The patient understands the proposed procedure, potential alternative treatments, and the course of recovery to be expected.  All of the patient's questions have been answered at this time.  The patient wishes to proceed with surgery.  Sami Roes M. Jashad Depaula, MD, FACS General & Endocrine Surgery [facility]   Visit Diagnoses: 1. DIVERTICULOSIS OF COLON   2. Abdominal pain, generalized     Primary Care Physician: NNODI, ADAKU, MD   

## 2012-10-10 ENCOUNTER — Encounter (HOSPITAL_COMMUNITY)
Admission: RE | Admit: 2012-10-10 | Discharge: 2012-10-10 | Disposition: A | Discharge status: Home / Self Care | Payer: BC Managed Care – PPO | Source: Ambulatory Visit | Attending: Surgery | Admitting: Surgery

## 2012-10-10 ENCOUNTER — Ambulatory Visit (HOSPITAL_COMMUNITY)
Admission: RE | Admit: 2012-10-10 | Discharge: 2012-10-10 | Disposition: A | Discharge status: Home / Self Care | Payer: BC Managed Care – PPO | Source: Ambulatory Visit | Attending: Surgery | Admitting: Surgery

## 2012-10-10 ENCOUNTER — Encounter (HOSPITAL_COMMUNITY): Payer: Self-pay

## 2012-10-10 ENCOUNTER — Encounter (HOSPITAL_COMMUNITY): Payer: Self-pay | Admitting: Pharmacy Technician

## 2012-10-10 DIAGNOSIS — R9431 Abnormal electrocardiogram [ECG] [EKG]: Secondary | ICD-10-CM | POA: Insufficient documentation

## 2012-10-10 DIAGNOSIS — Z01810 Encounter for preprocedural cardiovascular examination: Secondary | ICD-10-CM | POA: Insufficient documentation

## 2012-10-10 DIAGNOSIS — E039 Hypothyroidism, unspecified: Secondary | ICD-10-CM | POA: Insufficient documentation

## 2012-10-10 DIAGNOSIS — K5730 Diverticulosis of large intestine without perforation or abscess without bleeding: Secondary | ICD-10-CM | POA: Insufficient documentation

## 2012-10-10 DIAGNOSIS — Z01818 Encounter for other preprocedural examination: Secondary | ICD-10-CM | POA: Insufficient documentation

## 2012-10-10 DIAGNOSIS — Z01812 Encounter for preprocedural laboratory examination: Secondary | ICD-10-CM | POA: Insufficient documentation

## 2012-10-10 HISTORY — DX: Unspecified temporomandibular joint disorder, unspecified side: M26.609

## 2012-10-10 HISTORY — DX: Dizziness and giddiness: R42

## 2012-10-10 HISTORY — DX: Gastro-esophageal reflux disease without esophagitis: K21.9

## 2012-10-10 LAB — BASIC METABOLIC PANEL
BUN: 10 mg/dL (ref 6–23)
Calcium: 8.8 mg/dL (ref 8.4–10.5)
Creatinine, Ser: 0.62 mg/dL (ref 0.50–1.10)
GFR calc Af Amer: >90 mL/min (ref >90)
GFR calc non Af Amer: >90 mL/min (ref >90)
Glucose, Bld: 85 mg/dL (ref 70–99)

## 2012-10-10 LAB — CBC
HCT: 40.6 % (ref 36.0–46.0)
Hemoglobin: 13.7 g/dL (ref 12.0–15.0)
MCH: 29.8 pg (ref 26.0–34.0)
MCHC: 33.7 g/dL (ref 30.0–36.0)
MCV: 88.5 fL (ref 78.0–100.0)
RDW: 12.5 % (ref 11.5–15.5)

## 2012-10-10 LAB — SURGICAL PCR SCREEN: Staphylococcus aureus: NEGATIVE

## 2012-10-10 NOTE — Patient Instructions (Addendum)
YOUR SURGERY IS SCHEDULED AT [facility]  ON:   Thursday  4/10  REPORT TO [facility] AT:  10:00 AM      PHONE # FOR SHORT STAY IS [phone]  DO NOT EAT OR DRINK ANYTHING AFTER MIDNIGHT THE NIGHT BEFORE YOUR SURGERY.  YOU MAY BRUSH YOUR TEETH, RINSE OUT YOUR MOUTH--BUT NO WATER, NO FOOD, NO CHEWING GUM, NO MINTS, NO CANDIES, NO CHEWING TOBACCO.  PLEASE TAKE THE FOLLOWING MEDICATIONS THE AM OF YOUR SURGERY WITH A FEW SIPS OF WATER:  LEVOTHYROXINE- IF YOU CAN TAKE WITHOUT IT CAUSING NAUSEA, ALPRAZOLAM IF DESIRED FOR ANXIETY, MAY WEAR ESTRADIOL PATCH    DO NOT BRING VALUABLES, MONEY, CREDIT CARDS.  DO NOT WEAR JEWELRY, MAKE-UP, NAIL POLISH AND NO METAL PINS OR CLIPS IN YOUR HAIR. CONTACT LENS, DENTURES / PARTIALS, GLASSES SHOULD NOT BE WORN TO SURGERY AND IN MOST CASES-HEARING AIDS WILL NEED TO BE REMOVED.  BRING YOUR GLASSES CASE, ANY EQUIPMENT NEEDED FOR YOUR CONTACT LENS. FOR PATIENTS ADMITTED TO THE HOSPITAL--CHECK OUT TIME THE DAY OF DISCHARGE IS 11:00 AM.  ALL INPATIENT ROOMS ARE PRIVATE - WITH BATHROOM, TELEPHONE, TELEVISION AND WIFI INTERNET.                             PLEASE READ OVER ANY  FACT SHEETS THAT YOU WERE GIVEN: MRSA INFORMATION, BLOOD TRANSFUSION INFORMATION FAILURE TO FOLLOW THESE INSTRUCTIONS MAY RESULT IN THE CANCELLATION OF YOUR SURGERY.   PATIENT SIGNATURE_________________________________

## 2012-10-10 NOTE — Pre-Procedure Instructions (Signed)
B/P ELEVATED TODAY AT PREOP VISIT 156 / 88.  PT STATES HER B/P ALWAYS ELEVATED AT DOCTOR VISITS-BUT ALWAYS OK WHEN SHE CHECKS AT HOME.  EKG AND CXR WERE DONE TODAY AT [facility].

## 2012-10-11 NOTE — Progress Notes (Signed)
Quick Note:  These results are acceptable for scheduled surgery.  Tessi Eustache M. Gerri Acre, MD, FACS [facility] Office: [phone]   ______ 

## 2012-10-12 ENCOUNTER — Encounter (HOSPITAL_COMMUNITY): Payer: Self-pay | Admitting: Anesthesiology

## 2012-10-12 ENCOUNTER — Ambulatory Visit (HOSPITAL_COMMUNITY): Payer: BC Managed Care – PPO | Admitting: Anesthesiology

## 2012-10-12 ENCOUNTER — Encounter (HOSPITAL_COMMUNITY): Payer: Self-pay | Admitting: Surgery

## 2012-10-12 ENCOUNTER — Encounter (HOSPITAL_COMMUNITY)
Admission: RE | Disposition: A | Discharge status: Home / Self Care | Payer: Self-pay | Source: Ambulatory Visit | Attending: Surgery

## 2012-10-12 ENCOUNTER — Inpatient Hospital Stay (HOSPITAL_COMMUNITY)
Admission: RE | Admit: 2012-10-12 | Discharge: 2012-10-16 | DRG: 149 | Disposition: A | Discharge status: Home / Self Care | Payer: BC Managed Care – PPO | Source: Ambulatory Visit | Attending: Surgery | Admitting: Surgery

## 2012-10-12 DIAGNOSIS — Z860100 Personal history of colon polyps, unspecified: Secondary | ICD-10-CM

## 2012-10-12 DIAGNOSIS — R1084 Generalized abdominal pain: Secondary | ICD-10-CM | POA: Diagnosis present

## 2012-10-12 DIAGNOSIS — T40605A Adverse effect of unspecified narcotics, initial encounter: Secondary | ICD-10-CM | POA: Diagnosis not present

## 2012-10-12 DIAGNOSIS — Z888 Allergy status to other drugs, medicaments and biological substances status: Secondary | ICD-10-CM

## 2012-10-12 DIAGNOSIS — E785 Hyperlipidemia, unspecified: Secondary | ICD-10-CM | POA: Diagnosis present

## 2012-10-12 DIAGNOSIS — J45909 Unspecified asthma, uncomplicated: Secondary | ICD-10-CM | POA: Diagnosis present

## 2012-10-12 DIAGNOSIS — Z882 Allergy status to sulfonamides status: Secondary | ICD-10-CM

## 2012-10-12 DIAGNOSIS — I10 Essential (primary) hypertension: Secondary | ICD-10-CM | POA: Diagnosis present

## 2012-10-12 DIAGNOSIS — K5732 Diverticulitis of large intestine without perforation or abscess without bleeding: Principal | ICD-10-CM | POA: Diagnosis present

## 2012-10-12 DIAGNOSIS — K5730 Diverticulosis of large intestine without perforation or abscess without bleeding: Secondary | ICD-10-CM

## 2012-10-12 DIAGNOSIS — Z800 Family history of malignant neoplasm of digestive organs: Secondary | ICD-10-CM

## 2012-10-12 DIAGNOSIS — Z79899 Other long term (current) drug therapy: Secondary | ICD-10-CM

## 2012-10-12 DIAGNOSIS — R51 Headache: Secondary | ICD-10-CM | POA: Diagnosis not present

## 2012-10-12 DIAGNOSIS — E890 Postprocedural hypothyroidism: Secondary | ICD-10-CM | POA: Diagnosis present

## 2012-10-12 DIAGNOSIS — Z9089 Acquired absence of other organs: Secondary | ICD-10-CM

## 2012-10-12 HISTORY — PX: PARTIAL COLECTOMY: SHX5273

## 2012-10-12 LAB — CBC
Hemoglobin: 13.2 g/dL (ref 12.0–15.0)
MCHC: 33.8 g/dL (ref 30.0–36.0)
Platelets: 279 10*3/uL (ref 150–400)

## 2012-10-12 LAB — CREATININE, SERUM
Creatinine, Ser: 0.6 mg/dL (ref 0.50–1.10)
GFR calc non Af Amer: >90 mL/min (ref >90)

## 2012-10-12 SURGERY — COLECTOMY, PARTIAL
Anesthesia: General | Site: Abdomen | Wound class: Clean Contaminated

## 2012-10-12 MED ORDER — KETOROLAC TROMETHAMINE 30 MG/ML IJ SOLN
INTRAMUSCULAR | Status: AC
  Administered 2012-10-12: 30 mg
  Filled 2012-10-12: qty 1

## 2012-10-12 MED ORDER — DIPHENHYDRAMINE HCL 50 MG/ML IJ SOLN: 12.5000 mg | Freq: Four times a day (QID) | INTRAMUSCULAR | Status: DC | PRN

## 2012-10-12 MED ORDER — ONDANSETRON HCL 4 MG PO TABS: 4.0000 mg | ORAL_TABLET | Freq: Four times a day (QID) | ORAL | Status: DC | PRN

## 2012-10-12 MED ORDER — FENTANYL CITRATE 0.05 MG/ML IJ SOLN
INTRAMUSCULAR | Status: DC | PRN
  Administered 2012-10-12: 100 ug via INTRAVENOUS

## 2012-10-12 MED ORDER — LACTATED RINGERS IV SOLN
INTRAVENOUS | Status: DC
  Administered 2012-10-12: 13:00:00 via INTRAVENOUS
  Administered 2012-10-12: 1000 mL via INTRAVENOUS
  Administered 2012-10-12: 14:00:00 via INTRAVENOUS

## 2012-10-12 MED ORDER — ACETAMINOPHEN 10 MG/ML IV SOLN
INTRAVENOUS | Status: DC | PRN
  Administered 2012-10-12: 1000 mg via INTRAVENOUS

## 2012-10-12 MED ORDER — PROPOFOL 10 MG/ML IV BOLUS
INTRAVENOUS | Status: DC | PRN
  Administered 2012-10-12: 150 mg via INTRAVENOUS

## 2012-10-12 MED ORDER — PROMETHAZINE HCL 25 MG/ML IJ SOLN: 6.2500 mg | INTRAMUSCULAR | Status: DC | PRN

## 2012-10-12 MED ORDER — HYDROMORPHONE 0.3 MG/ML IV SOLN
INTRAVENOUS | Status: AC
  Administered 2012-10-12: 15:00:00
  Filled 2012-10-12: qty 25

## 2012-10-12 MED ORDER — SUCCINYLCHOLINE CHLORIDE 20 MG/ML IJ SOLN
INTRAMUSCULAR | Status: DC | PRN
  Administered 2012-10-12: 100 mg via INTRAVENOUS

## 2012-10-12 MED ORDER — HYDROMORPHONE HCL PF 1 MG/ML IJ SOLN
INTRAMUSCULAR | Status: AC
  Administered 2012-10-12: 1 mg
  Filled 2012-10-12: qty 1

## 2012-10-12 MED ORDER — ROCURONIUM BROMIDE 100 MG/10ML IV SOLN
INTRAVENOUS | Status: DC | PRN
  Administered 2012-10-12: 10 mg via INTRAVENOUS
  Administered 2012-10-12: 5 mg via INTRAVENOUS
  Administered 2012-10-12: 30 mg via INTRAVENOUS

## 2012-10-12 MED ORDER — KCL IN DEXTROSE-NACL 20-5-0.45 MEQ/L-%-% IV SOLN
INTRAVENOUS | Status: DC
  Administered 2012-10-12 – 2012-10-15 (×4): via INTRAVENOUS
  Filled 2012-10-12 (×5): qty 1000

## 2012-10-12 MED ORDER — MIDAZOLAM HCL 5 MG/5ML IJ SOLN
INTRAMUSCULAR | Status: DC | PRN
  Administered 2012-10-12: 2 mg via INTRAVENOUS

## 2012-10-12 MED ORDER — NEOSTIGMINE METHYLSULFATE 1 MG/ML IJ SOLN
INTRAMUSCULAR | Status: DC | PRN
  Administered 2012-10-12: 4 mg via INTRAVENOUS

## 2012-10-12 MED ORDER — EPHEDRINE SULFATE 50 MG/ML IJ SOLN
INTRAMUSCULAR | Status: DC | PRN
  Administered 2012-10-12: 10 mg via INTRAVENOUS

## 2012-10-12 MED ORDER — ALVIMOPAN 12 MG PO CAPS
12.0000 mg | ORAL_CAPSULE | Freq: Once | ORAL | Status: AC
  Administered 2012-10-12: 12 mg via ORAL
  Filled 2012-10-12: qty 1

## 2012-10-12 MED ORDER — METOCLOPRAMIDE HCL 5 MG/ML IJ SOLN
INTRAMUSCULAR | Status: DC | PRN
  Administered 2012-10-12: 10 mg via INTRAVENOUS

## 2012-10-12 MED ORDER — ONDANSETRON HCL 4 MG/2ML IJ SOLN
4.0000 mg | Freq: Four times a day (QID) | INTRAMUSCULAR | Status: DC | PRN
  Administered 2012-10-14: 4 mg via INTRAVENOUS
  Filled 2012-10-12: qty 2

## 2012-10-12 MED ORDER — FENTANYL CITRATE 0.05 MG/ML IJ SOLN
INTRAMUSCULAR | Status: DC | PRN
  Administered 2012-10-12 (×5): 50 ug via INTRAVENOUS
  Administered 2012-10-12: 100 ug via INTRAVENOUS

## 2012-10-12 MED ORDER — HYDROMORPHONE HCL PF 1 MG/ML IJ SOLN: 0.2500 mg | INTRAMUSCULAR | Status: DC | PRN

## 2012-10-12 MED ORDER — SODIUM CHLORIDE 0.9 % IR SOLN
Status: DC | PRN
  Administered 2012-10-12: 1000 mL

## 2012-10-12 MED ORDER — ESTRADIOL 0.075 MG/24HR TD PTWK
0.0750 mg | MEDICATED_PATCH | TRANSDERMAL | Status: DC
  Filled 2012-10-12: qty 1

## 2012-10-12 MED ORDER — HYDROMORPHONE 0.3 MG/ML IV SOLN
INTRAVENOUS | Status: DC
  Administered 2012-10-12: 1.5 mg via INTRAVENOUS
  Administered 2012-10-12 – 2012-10-13 (×2): 0.3 mg via INTRAVENOUS
  Administered 2012-10-13: 0.9 mg via INTRAVENOUS

## 2012-10-12 MED ORDER — MIDAZOLAM HCL 2 MG/2ML IJ SOLN
INTRAMUSCULAR | Status: AC
  Administered 2012-10-12 (×2): 1 mg
  Filled 2012-10-12: qty 2

## 2012-10-12 MED ORDER — DEXAMETHASONE SODIUM PHOSPHATE 10 MG/ML IJ SOLN
INTRAMUSCULAR | Status: DC | PRN
  Administered 2012-10-12: 10 mg via INTRAVENOUS

## 2012-10-12 MED ORDER — NALOXONE HCL 0.4 MG/ML IJ SOLN: 0.4000 mg | INTRAMUSCULAR | Status: DC | PRN

## 2012-10-12 MED ORDER — HYDROMORPHONE HCL PF 1 MG/ML IJ SOLN
INTRAMUSCULAR | Status: AC
Start: 2012-10-12 — End: 2012-10-12
  Administered 2012-10-12: 1 mg
  Filled 2012-10-12: qty 1

## 2012-10-12 MED ORDER — GLYCOPYRROLATE 0.2 MG/ML IJ SOLN
INTRAMUSCULAR | Status: DC | PRN
  Administered 2012-10-12: 0.6 mg via INTRAVENOUS

## 2012-10-12 MED ORDER — ALVIMOPAN 12 MG PO CAPS: 12.0000 mg | ORAL_CAPSULE | Freq: Two times a day (BID) | ORAL | Status: DC

## 2012-10-12 MED ORDER — DIPHENHYDRAMINE HCL 12.5 MG/5ML PO ELIX: 12.5000 mg | ORAL_SOLUTION | Freq: Four times a day (QID) | ORAL | Status: DC | PRN

## 2012-10-12 MED ORDER — ACETAMINOPHEN 10 MG/ML IV SOLN
INTRAVENOUS | Status: AC
  Filled 2012-10-12: qty 100

## 2012-10-12 MED ORDER — ONDANSETRON HCL 4 MG/2ML IJ SOLN: 4.0000 mg | Freq: Four times a day (QID) | INTRAMUSCULAR | Status: DC | PRN

## 2012-10-12 MED ORDER — ENOXAPARIN SODIUM 40 MG/0.4ML ~~LOC~~ SOLN
40.0000 mg | SUBCUTANEOUS | Status: DC
  Administered 2012-10-13 – 2012-10-15 (×3): 40 mg via SUBCUTANEOUS
  Filled 2012-10-12 (×4): qty 0.4

## 2012-10-12 MED ORDER — KETOROLAC TROMETHAMINE 30 MG/ML IJ SOLN: 15.0000 mg | Freq: Once | INTRAMUSCULAR | Status: DC | PRN

## 2012-10-12 MED ORDER — SODIUM CHLORIDE 0.9 % IV SOLN
1.0000 g | INTRAVENOUS | Status: AC
  Administered 2012-10-12: 1 g via INTRAVENOUS
  Filled 2012-10-12: qty 1

## 2012-10-12 MED ORDER — ESTRADIOL 0.075 MG/24HR TD PTTW: 1.0000 | MEDICATED_PATCH | TRANSDERMAL | Status: DC

## 2012-10-12 MED ORDER — ONDANSETRON HCL 4 MG/2ML IJ SOLN
INTRAMUSCULAR | Status: DC | PRN
  Administered 2012-10-12: 4 mg via INTRAVENOUS

## 2012-10-12 MED ORDER — SODIUM CHLORIDE 0.9 % IJ SOLN: 9.0000 mL | INTRAMUSCULAR | Status: DC | PRN

## 2012-10-12 SURGICAL SUPPLY — 52 items
APPLICATOR COTTON TIP 6IN STRL (MISCELLANEOUS) IMPLANT
BLADE EXTENDED COATED 6.5IN (ELECTRODE) ×2 IMPLANT
BLADE HEX COATED 2.75 (ELECTRODE) ×2 IMPLANT
BLADE SURG SZ10 CARB STEEL (BLADE) IMPLANT
CANISTER SUCTION 2500CC (MISCELLANEOUS) ×2 IMPLANT
CHLORAPREP W/TINT 26ML (MISCELLANEOUS) ×2 IMPLANT
CLIP TI LARGE 6 (CLIP) IMPLANT
CLOTH BEACON ORANGE TIMEOUT ST (SAFETY) ×2 IMPLANT
COVER MAYO STAND STRL (DRAPES) ×2 IMPLANT
DRAPE LAPAROSCOPIC ABDOMINAL (DRAPES) ×2 IMPLANT
DRAPE LG THREE QUARTER DISP (DRAPES) ×4 IMPLANT
DRAPE WARM FLUID 44X44 (DRAPE) ×2 IMPLANT
DRSG OPSITE POSTOP 4X8 (GAUZE/BANDAGES/DRESSINGS) ×2 IMPLANT
DRSG PAD ABDOMINAL 8X10 ST (GAUZE/BANDAGES/DRESSINGS) IMPLANT
ELECT REM PT RETURN 9FT ADLT (ELECTROSURGICAL) ×2
ELECTRODE REM PT RTRN 9FT ADLT (ELECTROSURGICAL) ×1 IMPLANT
ENSEAL DEVICE STD TIP 35CM (ENDOMECHANICALS) IMPLANT
GLOVE BIOGEL PI IND STRL 7.0 (GLOVE) ×3 IMPLANT
GLOVE BIOGEL PI INDICATOR 7.0 (GLOVE) ×3
GLOVE SURG ORTHO 8.0 STRL STRW (GLOVE) ×6 IMPLANT
GOWN STRL NON-REIN LRG LVL3 (GOWN DISPOSABLE) ×2 IMPLANT
GOWN STRL REIN XL XLG (GOWN DISPOSABLE) ×10 IMPLANT
HAND ACTIVATED (MISCELLANEOUS) IMPLANT
KIT BASIN OR (CUSTOM PROCEDURE TRAY) ×2 IMPLANT
LEGGING LITHOTOMY PAIR STRL (DRAPES) ×2 IMPLANT
LIGASURE IMPACT 36 18CM CVD LR (INSTRUMENTS) ×2 IMPLANT
NS IRRIG 1000ML POUR BTL (IV SOLUTION) ×4 IMPLANT
PACK GENERAL/GYN (CUSTOM PROCEDURE TRAY) ×2 IMPLANT
RELOAD PROXIMATE 75MM BLUE (ENDOMECHANICALS) IMPLANT
SEALER TISSUE X1 CVD JAW (INSTRUMENTS) IMPLANT
SPONGE GAUZE 4X4 12PLY (GAUZE/BANDAGES/DRESSINGS) ×2 IMPLANT
SPONGE LAP 18X18 X RAY DECT (DISPOSABLE) ×2 IMPLANT
STAPLER PROXIMATE 75MM BLUE (STAPLE) ×2 IMPLANT
STAPLER VISISTAT 35W (STAPLE) ×2 IMPLANT
SUCTION POOLE TIP (SUCTIONS) ×4 IMPLANT
SUT NOV 1 T60/GS (SUTURE) IMPLANT
SUT NOVA NAB DX-16 0-1 5-0 T12 (SUTURE) IMPLANT
SUT NOVA NAB GS-21 1 T12 (SUTURE) ×8 IMPLANT
SUT NOVA T20/GS 25 (SUTURE) IMPLANT
SUT PDS AB 1 TP1 96 (SUTURE) IMPLANT
SUT SILK 2 0 (SUTURE) ×2
SUT SILK 2 0 SH CR/8 (SUTURE) ×2 IMPLANT
SUT SILK 2 0SH CR/8 30 (SUTURE) IMPLANT
SUT SILK 2-0 18XBRD TIE 12 (SUTURE) ×1 IMPLANT
SUT SILK 2-0 30XBRD TIE 12 (SUTURE) IMPLANT
SUT SILK 3 0 (SUTURE) ×2
SUT SILK 3 0 SH CR/8 (SUTURE) ×8 IMPLANT
SUT SILK 3-0 18XBRD TIE 12 (SUTURE) ×2 IMPLANT
TOWEL OR 17X26 10 PK STRL BLUE (TOWEL DISPOSABLE) ×4 IMPLANT
TRAY FOLEY CATH 14FRSI W/METER (CATHETERS) ×2 IMPLANT
WATER STERILE IRR 1500ML POUR (IV SOLUTION) IMPLANT
YANKAUER SUCT BULB TIP NO VENT (SUCTIONS) ×4 IMPLANT

## 2012-10-12 NOTE — H&P (View-Only) (Signed)
General Surgery [facility]  Chief Complaint  Patient presents with  . New Evaluation    evaluate diverticular disease for resection - referral from Dr. Sheryn Bison, [facility]    HISTORY: Patient is a 62 year old white female well known to my surgical practice from her history of thyroidectomy. Patient is referred at this time for management of chronic diverticular disease. Patient has had intermittent episodes of diverticulitis dating back over 10 years. She has been on oral antibiotic therapy multiple times. Recently she has left lower quadrant abdominal pain radiating to the back on a frequent basis. She notes irregular bowel movements. Patient underwent CT scan of the abdomen followed by a colonoscopy 2 days ago. This shows severe diverticular disease involving the distal descending colon and sigmoid colon. This is described in a report by her gastroenterologist. She is referred at this time for consideration for resection of the distal descending colon and sigmoid colon for management of chronic diverticular disease and abdominal pain.  Past Medical History  Diagnosis Date  . Asthma   . Hypothyroidism   . Hyperlipidemia   . Personal history of colonic polyps 01/20/2000    TUBULAR ADENOMA  . Diverticulosis of colon (without mention of hemorrhage)      Current Outpatient Prescriptions  Medication Sig Dispense Refill  . Calcium-Vitamin D-Vitamin K (CALCIUM + D + K PO) Take 1 capsule by mouth daily.      Marland Kitchen estradiol (VIVELLE-DOT) 0.075 MG/24HR Place 1 patch onto the skin 2 (two) times a week. Wednesday,saturday       . fish oil-omega-3 fatty acids 1000 MG capsule Take 2 g by mouth daily.      Marland Kitchen HYDROcodone-acetaminophen (NORCO/VICODIN) 5-325 MG per tablet Take 1 tablet by mouth every 6 (six) hours as needed for pain.  30 tablet  0  . levothyroxine (SYNTHROID, LEVOTHROID) 88 MCG tablet Take 1 tablet (88 mcg total) by mouth daily.  90 tablet  3  . mesalamine  (LIALDA) 1.2 G EC tablet Take 1,200 mg by mouth daily with breakfast. 4 once a day      . Red Yeast Rice Extract (RED YEAST RICE PO) Take 1 capsule by mouth 2 (two) times daily.      . vitamin E 1000 UNIT capsule Take 1,000 Units by mouth 2 (two) times daily.       No current facility-administered medications for this visit.     Allergies  Allergen Reactions  . Statins     Give pt GERD  . Sulfonamide Derivatives Hives     Family History  Problem Relation Age of Onset  . Thyroid cancer Mother   . Colon cancer Father   . Cancer Father     bladder  . Prostate cancer Brother   . Prostate cancer Brother   . Colonic polyp Brother   . Colon polyps Brother   . Diabetes Mother   . Heart disease Mother      History   Social History  . Marital Status: Divorced    Spouse Name: N/A    Number of Children: 1  . 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 Smoking status: Never Smoker   . Smokeless tobacco: Never Used  . Alcohol Use: Yes     Comment: occassionally  . Drug Use: No  . Sexually Active: None   Other Topics Concern  . None   Social  History Narrative  . None     REVIEW OF SYSTEMS - PERTINENT POSITIVES ONLY: Patient reports 3-4 bowel movements daily. She does note urgency. She denies bleeding direct him.  EXAM: Filed Vitals:   10/06/12 1419  Pulse: 88  Temp: 97.4 F (36.3 C)  Resp: 16    HEENT: normocephalic; pupils equal and reactive; sclerae clear; dentition good; mucous membranes moist NECK:  Well-healed surgical incision; symmetric on extension; no palpable anterior or posterior cervical lymphadenopathy; no supraclavicular masses; no tenderness CHEST: clear to auscultation bilaterally without rales, rhonchi, or wheezes CARDIAC: regular rate and rhythm without significant murmur; peripheral pulses are full ABDOMEN: soft without distension; bowel sounds present; no mass;  no hepatosplenomegaly; no hernia; well-healed laparoscopic incisions; mild to moderate tenderness left lower quadrant without guarding or mass EXT:  non-tender without edema; no deformity NEURO: no gross focal deficits; no sign of tremor   LABORATORY RESULTS: See [facility] (CHL-Epic) for most recent results   RADIOLOGY RESULTS: See [facility] (CHL-Epic) for most recent results   IMPRESSION: Diverticular disease, chronic  PLAN: The patient, her significant other, and myself had a lengthy discussion regarding the above findings. We reviewed the results of her recent CT scan and of her colonoscopy. We discussed options for management. These include both nonoperative and operative strategies.  After careful consideration, the patient would like to proceed with sigmoid colectomy. This was recommended by her gastroenterologist. We discussed the procedure. We discussed the hospital stay to be anticipated. We discussed the postoperative recovery and return to activity and work. We discussed potential complications including bleeding, infection, need for colostomy, and injury to other organs. She understands and wishes to proceed with surgery. We will make arrangements for her procedure in the near future at a time convenient for her.  The risks and benefits of the procedure have been discussed at length with the patient.  The patient understands the proposed procedure, potential alternative treatments, and the course of recovery to be expected.  All of the patient's questions have been answered at this time.  The patient wishes to proceed with surgery.  Velora Heckler, MD, FACS General & Endocrine Surgery [facility]   Visit Diagnoses: 1. DIVERTICULOSIS OF COLON   2. Abdominal pain, generalized     Primary Care Physician: Gretel Acre, MD

## 2012-10-12 NOTE — Addendum Note (Signed)
Addendum created 10/12/12 1446 by Eilene Ghazi, MD   Modules edited: Anesthesia Attestations

## 2012-10-12 NOTE — Preoperative (Signed)
Beta Blockers   Reason not to administer Beta Blockers:Not Applicable 

## 2012-10-12 NOTE — Anesthesia Postprocedure Evaluation (Signed)
  Anesthesia Post-op Note  Patient: Linda Wang  Procedure(s) Performed: Procedure(s) (LRB): SIGMOID COLECTOMY (N/A)  Patient Location: PACU  Anesthesia Type: General  Level of Consciousness: awake and alert   Airway and Oxygen Therapy: Patient Spontanous Breathing  Post-op Pain: mild  Post-op Assessment: Post-op Vital signs reviewed, Patient's Cardiovascular Status Stable, Respiratory Function Stable, Patent Airway and No signs of Nausea or vomiting  Last Vitals:  Filed Vitals:   10/12/12 1415  BP: 154/91  Pulse: 105  Resp: 13    Post-op Vital Signs: stable   Complications: No apparent anesthesia complications

## 2012-10-12 NOTE — Anesthesia Preprocedure Evaluation (Signed)
Anesthesia Evaluation  Patient identified by MRN, date of birth, ID band Patient awake    Reviewed: Allergy & Precautions, H&P , NPO status , Patient's Chart, lab work & pertinent test results  Airway Mallampati: II TM Distance: >3 FB Neck ROM: Full    Dental no notable dental hx.    Pulmonary neg pulmonary ROS,  breath sounds clear to auscultation  Pulmonary exam normal       Cardiovascular negative cardio ROS  Rhythm:Regular Rate:Normal     Neuro/Psych negative neurological ROS  negative psych ROS   GI/Hepatic negative GI ROS, Neg liver ROS,   Endo/Other  Hypothyroidism   Renal/GU negative Renal ROS  negative genitourinary   Musculoskeletal negative musculoskeletal ROS (+)   Abdominal   Peds negative pediatric ROS (+)  Hematology negative hematology ROS (+)   Anesthesia Other Findings   Reproductive/Obstetrics negative OB ROS                           Anesthesia Physical Anesthesia Plan  ASA: II  Anesthesia Plan: General   Post-op Pain Management:    Induction: Intravenous  Airway Management Planned: Oral ETT  Additional Equipment:   Intra-op Plan:   Post-operative Plan: Extubation in OR  Informed Consent: I have reviewed the patients History and Physical, chart, labs and discussed the procedure including the risks, benefits and alternatives for the proposed anesthesia with the patient or authorized representative who has indicated his/her understanding and acceptance.   Dental advisory given  Plan Discussed with: CRNA and Surgeon  Anesthesia Plan Comments:         Anesthesia Quick Evaluation  

## 2012-10-12 NOTE — Transfer of Care (Signed)
Immediate Anesthesia Transfer of Care Note  Patient: Linda Wang  Procedure(s) Performed: Procedure(s): SIGMOID COLECTOMY (N/A)  Patient Location: PACU  Anesthesia Type:General  Level of Consciousness: awake, alert , oriented and patient cooperative  Airway & Oxygen Therapy: Patient Spontanous Breathing and Patient connected to face mask oxygen  Post-op Assessment: Report given to PACU RN and Post -op Vital signs reviewed and stable  Post vital signs: Reviewed and stable  Complications: No apparent anesthesia complications

## 2012-10-12 NOTE — Brief Op Note (Signed)
10/12/2012  2:05 PM  PATIENT:  Linda Wang  62 y.o. female  PRE-OPERATIVE DIAGNOSIS:  diverticular disease  POST-OPERATIVE DIAGNOSIS:  diverticular disease  PROCEDURE:  Procedure(s): SIGMOID COLECTOMY (N/A)  SURGEON:  Surgeon(s) and Role:    * Velora Heckler, MD - Primary  ASSISTANTS:  Karie Soda, MD   ANESTHESIA:   general  EBL:  Total I/O In: 2000 [I.V.:2000] Out: 150 [Urine:50; Blood:100]  BLOOD ADMINISTERED:none  DRAINS: none   LOCAL MEDICATIONS USED:  NONE  SPECIMEN:  Excision  DISPOSITION OF SPECIMEN:  PATHOLOGY  COUNTS:  YES  TOURNIQUET:  * No tourniquets in log *  DICTATION: .Other Dictation: Dictation Number [id number]  PLAN OF CARE: Admit to inpatient   PATIENT DISPOSITION:  PACU - hemodynamically stable.   Delay start of Pharmacological VTE agent (>24hrs) due to surgical blood loss or risk of bleeding: yes  Velora Heckler, MD, [facility] Office: [phone]

## 2012-10-12 NOTE — Interval H&P Note (Signed)
History and Physical Interval Note:  10/12/2012 10:05 AM  Linda Wang  has presented today for surgery, with the diagnosis of diverticular disease.  The various methods of treatment have been discussed with the patient and family. After consideration of risks, benefits and other options for treatment, the patient has consented to    Procedure(s): SIGMOID COLECTOMY (N/A) as a surgical intervention .    The patient's history has been reviewed, patient examined, no change in status, stable for surgery.  I have reviewed the patient's chart and labs.  Questions were answered to the patient's satisfaction.    Velora Heckler, MD, [facility] Office: [phone]   [place] Judie Petit

## 2012-10-13 ENCOUNTER — Encounter (HOSPITAL_COMMUNITY): Payer: Self-pay | Admitting: Surgery

## 2012-10-13 LAB — BASIC METABOLIC PANEL
BUN: 7 mg/dL (ref 6–23)
Chloride: 103 mEq/L (ref 96–112)
GFR calc Af Amer: >90 mL/min (ref >90)
GFR calc non Af Amer: >90 mL/min (ref >90)
Potassium: 4.2 mEq/L (ref 3.5–5.1)
Sodium: 136 mEq/L (ref 135–145)

## 2012-10-13 LAB — TYPE AND SCREEN

## 2012-10-13 LAB — CBC
MCHC: 33.2 g/dL (ref 30.0–36.0)
Platelets: 281 10*3/uL (ref 150–400)
RDW: 12.4 % (ref 11.5–15.5)
WBC: 11.6 10*3/uL — ABNORMAL HIGH (ref 4.0–10.5)

## 2012-10-13 MED ORDER — NALOXONE HCL 0.4 MG/ML IJ SOLN: 0.4000 mg | INTRAMUSCULAR | Status: DC | PRN

## 2012-10-13 MED ORDER — ALVIMOPAN 12 MG PO CAPS
12.0000 mg | ORAL_CAPSULE | Freq: Two times a day (BID) | ORAL | Status: DC
  Administered 2012-10-13 – 2012-10-14 (×4): 12 mg via ORAL
  Filled 2012-10-13 (×6): qty 1

## 2012-10-13 MED ORDER — ACETAMINOPHEN 10 MG/ML IV SOLN
1000.0000 mg | Freq: Four times a day (QID) | INTRAVENOUS | Status: DC
  Administered 2012-10-13 – 2012-10-14 (×3): 1000 mg via INTRAVENOUS
  Filled 2012-10-13 (×7): qty 100

## 2012-10-13 MED ORDER — DIPHENHYDRAMINE HCL 12.5 MG/5ML PO ELIX: 12.5000 mg | ORAL_SOLUTION | Freq: Four times a day (QID) | ORAL | Status: DC | PRN

## 2012-10-13 MED ORDER — ONDANSETRON HCL 4 MG/2ML IJ SOLN: 4.0000 mg | Freq: Four times a day (QID) | INTRAMUSCULAR | Status: DC | PRN

## 2012-10-13 MED ORDER — SODIUM CHLORIDE 0.9 % IJ SOLN: 9.0000 mL | INTRAMUSCULAR | Status: DC | PRN

## 2012-10-13 MED ORDER — DIPHENHYDRAMINE HCL 50 MG/ML IJ SOLN: 12.5000 mg | Freq: Four times a day (QID) | INTRAMUSCULAR | Status: DC | PRN

## 2012-10-13 MED ORDER — MORPHINE SULFATE (PF) 1 MG/ML IV SOLN
INTRAVENOUS | Status: DC
  Administered 2012-10-13: 12:00:00 via INTRAVENOUS
  Administered 2012-10-13: 2 mg via INTRAVENOUS
  Administered 2012-10-13: 6 mg via INTRAVENOUS
  Administered 2012-10-13: 2 mg via INTRAVENOUS
  Administered 2012-10-14: 4 mg via INTRAVENOUS
  Administered 2012-10-14: 15:00:00 via INTRAVENOUS
  Administered 2012-10-14: 2.999 mg via INTRAVENOUS
  Administered 2012-10-14: 2 mg via INTRAVENOUS
  Filled 2012-10-13 (×2): qty 25

## 2012-10-13 MED ORDER — ALPRAZOLAM 0.5 MG PO TABS
0.5000 mg | ORAL_TABLET | Freq: Three times a day (TID) | ORAL | Status: DC | PRN
  Administered 2012-10-13 – 2012-10-14 (×3): 0.5 mg via ORAL
  Filled 2012-10-13 (×3): qty 1

## 2012-10-13 NOTE — Progress Notes (Signed)
Patient ID: Linda Wang, female   DOB: July 28, 1950, 62 y.o.   MRN: [id number]  General Surgery - [facility] - Progress Note  POD# 1   Subjective: Patient anxious.  Needs to ambulate, get up to chair.  Tolerating clear liquids this AM.  No nausea.  Does not like Dilaudid PCA - causing headache.  Requests renewal of her Xanax at home dosage.  Objective: Vital signs in last 24 hours: Temp:  [97.9 F (36.6 C)-98.9 F (37.2 C)] 98.9 F (37.2 C) (04/11 0525) Pulse Rate:  [101-112] 108 (04/11 0525) Resp:  [10-23] 22 (04/11 0834) BP: (137-155)/(75-91) 143/84 mmHg (04/11 0525) SpO2:  [93 %-100 %] 97 % (04/11 0834) Weight:  [165 lb (74.844 kg)] 165 lb (74.844 kg) (04/10 1644) Last BM Date: 10/12/12  Intake/Output from previous day: 04/10 0701 - 04/11 0700 In: 3825 [I.V.:3825] Out: 2125 [Urine:2025; Blood:100]  Exam: HEENT - clear, not icteric Neck - soft Chest - clear bilaterally Cor - RRR, no murmur Abd - soft without distension; BS present and active; dressing dry and intact Ext - no significant edema Neuro - grossly intact, no focal deficits  Lab Results:   Recent Labs  10/12/12 1631 10/13/12 0427  WBC 12.8* 11.6*  HGB 13.2 12.9  HCT 39.0 38.9  PLT 279 281     Recent Labs  10/10/12 1525 10/12/12 1631 10/13/12 0427  NA 139  --  136  K 3.4*  --  4.2  CL 103  --  103  CO2 28  --  26  GLUCOSE 85  --  143*  BUN 10  --  7  CREATININE 0.62 0.60 0.53  CALCIUM 8.8  --  8.4    Studies/Results: No results found.  Assessment / Plan: 1.  Status post sigmoid colectomy for diverticular disease  Clear liquid diet today  On Entereg po  Encouraged to ambulate, OOB to chair  Will change Dilaudid to morphine PCA at reduced dose  Will add IV Tylenol for 24 hours  Velora Heckler, MD, [facility] Office: [phone]  10/13/2012

## 2012-10-13 NOTE — Care Management Note (Signed)
    Page 1 of 1   10/13/2012     10:21:12 AM   CARE MANAGEMENT NOTE 10/13/2012  Patient:  Linda Wang, Linda Wang   Account Number:  [id number]  Date Initiated:  10/13/2012  Documentation initiated by:  Lorenda Ishihara  Subjective/Objective Assessment:   62 yo female admitted s/p sigmoid colectomy. PTA lived at home with spouse.     Action/Plan:   Home when stable   Anticipated DC Date:  10/16/2012   Anticipated DC Plan:  HOME/SELF CARE      DC Planning Services  CM consult      Choice offered to / List presented to:             Status of service:  Completed, signed off Medicare Important Message given?   (If response is "NO", the following Medicare IM given date fields will be blank) Date Medicare IM given:   Date Additional Medicare IM given:    Discharge Disposition:  HOME/SELF CARE  Per UR Regulation:  Reviewed for med. necessity/level of care/duration of stay  If discussed at Long Length of Stay Meetings, dates discussed:    Comments:

## 2012-10-13 NOTE — Op Note (Signed)
NAMEJONAH, NESTLE                   ACCOUNT NO.:  [id number]  MEDICAL RECORD NO.:  [id number]  LOCATION:  1532                         FACILITY:  [facility]  PHYSICIAN:  Velora Heckler, MD      DATE OF BIRTH:  March 08, 1951  DATE OF PROCEDURE:  10/12/2012                               OPERATIVE REPORT   PREOPERATIVE DIAGNOSIS:  Sigmoid diverticular disease.  POSTOPERATIVE DIAGNOSIS:  Sigmoid diverticular disease.  PROCEDURE:  Sigmoid colectomy.  SURGEON:  Velora Heckler, MD, FACS  ASSISTANT:  Ardeth Sportsman, MD  ANESTHESIA:  General.  ESTIMATED BLOOD LOSS:  Minimal.  PREPARATION:  ChloraPrep and Betadine.  COMPLICATIONS:  None.  INDICATIONS:  The patient is a 62 year old white female, known to my surgical practice due to her history of thyroidectomy.  The patient returns on referral from her Gastroenterology with a history of chronic diverticular disease.  She has had intermittent episodes of diverticulitis dating back over 10 years.  She has been treated with oral antibiotics and dietary changes on multiple occasions.  She now has chronic left lower quadrant abdominal pain radiating to the back.  She notes irregular bowel movements.  Recent CT scan showed significant diverticular disease in the descending and sigmoid colon.  This was confirmed on colonoscopy by her gastroenterologist to described a tortuous sigmoid colon with severe diverticular disease.  She now comes to Surgery for sigmoid colectomy.  BODY OF REPORT:  Procedure was done in OR #1 at the [facility].  The patient was brought to the operating room and placed in supine position on the operating room table.  Following administration of general anesthesia, the patient was positioned in low lithotomy and prepped and draped in usual strict aseptic fashion.  After ascertaining that an adequate level of anesthesia had been achieved, a midline abdominal incision was made with a #10 blade.  Dissection  was carried through the subcutaneous tissues.  Fascia was incised in the midline and the peritoneal cavity was entered cautiously.  Abdomen was explored.  There was significant disease in the distal descending colon. There was a redundant loop of sigmoid colon in the pelvis, which was adherent to the posterior wall of the bladder and the adnexa.  Balfour retractor was placed for exposure.  The sigmoid colon was mobilized from its lateral peritoneal attachments with the electrocautery.  Adhesions in the pelvis were lysed with the electrocautery.  The entire sigmoid colon was mobilized.  Distal descending colon appeared grossly normal with only a scattered diverticulum.  No inflammatory changes.  Mid and distal sigmoid colon showed thick muscular wall with thickening and induration within the mesentery.  Therefore, point in the proximal sigmoid colon was selected.  It was dissected out and transected with a GIA stapler.  The sigmoid colon mesentery was divided with the LigaSure, carrying the dissection distally.  Dissection was carried past the inflammatory changes.  The distal margin of resection was the pelvic peritoneal reflection at the distal sigmoid colon.  The proximal rectum appeared grossly normal. This mesentery was dissected off of the posterior wall of the bowel at this level.  A Satinsky clamp was placed across the  distal sigmoid.  The 2-0 silk stay sutures were placed in the proximal rectum.  The bowel was transected with a #10 blade and the specimen was passed off the field and submitted to Pathology for review.  Next, an end-to-end anastomosis was created between the proximal sigmoid colon and the proximal rectum.  This was performed in a single layer with interrupted 3-0 silk sutures.  There was good approximation of what appeared to be well-vascularized tissues.  There was no tension on the anastomosis.  Anastomosis was widely patent.  Mesenteric defect was closed with  interrupted 2-0 silk sutures.  The abdomen was irrigated with warm saline and evacuated.  Good hemostasis was achieved with the electrocautery.  Gowns and gloves were changed, operative field was reset as per protocol.  Abdomen was again irrigated with warm saline.  Viscera returned to their normal anatomic locations and all sponge counts were correct.  Omentum was used to cover the small bowel and the anastomosis.  Midline abdominal incision was closed with interrupted #1 Novafil simple sutures.  Subcutaneous tissues were irrigated.  Skin was closed with stainless steel staples.  A honeycomb dressing was applied.  The patient was awakened from anesthesia and brought to the recovery room.  The patient tolerated the procedure well.   Velora Heckler, MD, [facility] Office: [phone]    TMG/MEDQ  D:  10/12/2012  T:  10/13/2012  Job:  [id number]  cc:   Vania Rea. Jarold Motto, MD, FACG, FACP, FAGA [address] [place] [address]  Gretel Acre, MD Fax: [phone]

## 2012-10-14 DIAGNOSIS — I10 Essential (primary) hypertension: Secondary | ICD-10-CM

## 2012-10-14 LAB — BASIC METABOLIC PANEL
Chloride: 104 mEq/L (ref 96–112)
GFR calc Af Amer: >90 mL/min (ref >90)
GFR calc non Af Amer: >90 mL/min (ref >90)
Glucose, Bld: 107 mg/dL — ABNORMAL HIGH (ref 70–99)
Potassium: 4 mEq/L (ref 3.5–5.1)
Sodium: 138 mEq/L (ref 135–145)

## 2012-10-14 LAB — CBC
HCT: 37.8 % (ref 36.0–46.0)
Hemoglobin: 12.4 g/dL (ref 12.0–15.0)
MCHC: 32.8 g/dL (ref 30.0–36.0)
WBC: 8.9 10*3/uL (ref 4.0–10.5)

## 2012-10-14 MED ORDER — MESALAMINE 1.2 G PO TBEC
1200.0000 mg | DELAYED_RELEASE_TABLET | Freq: Every day | ORAL | Status: DC
  Administered 2012-10-15 – 2012-10-16 (×2): 1.2 g via ORAL
  Filled 2012-10-14 (×3): qty 1

## 2012-10-14 MED ORDER — ACETAMINOPHEN 500 MG PO TABS: 1000.0000 mg | ORAL_TABLET | Freq: Three times a day (TID) | ORAL | Status: DC

## 2012-10-14 MED ORDER — VITAMIN E 45 MG (100 UNIT) PO CAPS: 1000.0000 [IU] | ORAL_CAPSULE | Freq: Two times a day (BID) | ORAL | Status: DC

## 2012-10-14 MED ORDER — LIP MEDEX EX OINT
1.0000 "application " | TOPICAL_OINTMENT | Freq: Two times a day (BID) | CUTANEOUS | Status: DC
  Administered 2012-10-14 – 2012-10-16 (×5): 1 via TOPICAL
  Filled 2012-10-14: qty 7

## 2012-10-14 MED ORDER — ALUM & MAG HYDROXIDE-SIMETH 200-200-20 MG/5ML PO SUSP: 30.0000 mL | Freq: Four times a day (QID) | ORAL | Status: DC | PRN

## 2012-10-14 MED ORDER — LACTATED RINGERS IV BOLUS (SEPSIS): 1000.0000 mL | Freq: Three times a day (TID) | INTRAVENOUS | Status: AC | PRN

## 2012-10-14 MED ORDER — NAPROXEN 500 MG PO TABS
500.0000 mg | ORAL_TABLET | Freq: Two times a day (BID) | ORAL | Status: DC
  Administered 2012-10-14 – 2012-10-16 (×4): 500 mg via ORAL
  Filled 2012-10-14 (×6): qty 1

## 2012-10-14 MED ORDER — PHENOL 1.4 % MT LIQD
2.0000 | OROMUCOSAL | Status: DC | PRN
  Filled 2012-10-14: qty 177

## 2012-10-14 MED ORDER — OMEGA-3 FATTY ACIDS 1000 MG PO CAPS: 2.0000 g | ORAL_CAPSULE | Freq: Every day | ORAL | Status: DC

## 2012-10-14 MED ORDER — ALPRAZOLAM 0.5 MG PO TABS: 0.5000 mg | ORAL_TABLET | Freq: Three times a day (TID) | ORAL | Status: DC | PRN

## 2012-10-14 MED ORDER — LEVOTHYROXINE SODIUM 88 MCG PO TABS
88.0000 ug | ORAL_TABLET | Freq: Every day | ORAL | Status: DC
  Administered 2012-10-14 – 2012-10-16 (×3): 88 ug via ORAL
  Filled 2012-10-14 (×4): qty 1

## 2012-10-14 MED ORDER — VITAMIN E 180 MG (400 UNIT) PO CAPS
400.0000 [IU] | ORAL_CAPSULE | Freq: Two times a day (BID) | ORAL | Status: DC
  Administered 2012-10-15 – 2012-10-16 (×3): 400 [IU] via ORAL
  Filled 2012-10-14 (×6): qty 1

## 2012-10-14 MED ORDER — ESTRADIOL 0.075 MG/24HR TD PTTW: 1.0000 | MEDICATED_PATCH | TRANSDERMAL | Status: DC

## 2012-10-14 MED ORDER — OMEGA-3-ACID ETHYL ESTERS 1 G PO CAPS
2.0000 g | ORAL_CAPSULE | Freq: Every day | ORAL | Status: DC
  Administered 2012-10-14 – 2012-10-16 (×2): 2 g via ORAL
  Filled 2012-10-14 (×3): qty 2

## 2012-10-14 MED ORDER — MAGIC MOUTHWASH
15.0000 mL | Freq: Four times a day (QID) | ORAL | Status: DC | PRN
  Filled 2012-10-14: qty 15

## 2012-10-14 MED ORDER — METOPROLOL TARTRATE 12.5 MG HALF TABLET
12.5000 mg | ORAL_TABLET | Freq: Two times a day (BID) | ORAL | Status: DC | PRN
  Filled 2012-10-14: qty 1

## 2012-10-14 MED ORDER — METOPROLOL TARTRATE 12.5 MG HALF TABLET
12.5000 mg | ORAL_TABLET | Freq: Two times a day (BID) | ORAL | Status: DC
  Administered 2012-10-14 – 2012-10-16 (×5): 12.5 mg via ORAL
  Filled 2012-10-14 (×6): qty 1

## 2012-10-14 MED ORDER — ACETAMINOPHEN 325 MG PO TABS
325.0000 mg | ORAL_TABLET | Freq: Four times a day (QID) | ORAL | Status: DC | PRN
  Administered 2012-10-14 – 2012-10-16 (×5): 650 mg via ORAL
  Filled 2012-10-14 (×6): qty 2

## 2012-10-14 MED ORDER — ESTRADIOL 0.075 MG/24HR TD PTTW
1.0000 | MEDICATED_PATCH | TRANSDERMAL | Status: DC
  Administered 2012-10-14: 1 via TRANSDERMAL

## 2012-10-14 MED ORDER — ACETAMINOPHEN 650 MG RE SUPP: 650.0000 mg | Freq: Four times a day (QID) | RECTAL | Status: DC | PRN

## 2012-10-14 MED ORDER — SACCHAROMYCES BOULARDII 250 MG PO CAPS
250.0000 mg | ORAL_CAPSULE | Freq: Two times a day (BID) | ORAL | Status: DC
  Administered 2012-10-14 – 2012-10-16 (×5): 250 mg via ORAL
  Filled 2012-10-14 (×6): qty 1

## 2012-10-14 NOTE — Progress Notes (Signed)
Linda Wang [id number] 11-Nov-1950  CARE TEAM:  PCP: Gretel Acre, MD  Outpatient Care Team: Patient Care Team: Gretel Acre, MD as PCP - General (Family Medicine)  Inpatient Treatment Team: Treatment Team: Attending Provider: Velora Heckler, MD; Registered Nurse: Jonathon Bellows, RN   Subjective:  Feeling better.  Walking in hallways a little more.  Belching.  No flatus.  No bowel movement.  Morphine makes her a little sleepy but better pain control but I thought it.  Still with headache.  Usually takes ibuprofen that.  Laxity ice pack.  Wanting to try more by mouth.  No nausea no vomiting.  Objective:  Vital signs:  Filed Vitals:   10/13/12 2302 10/14/12 0400 10/14/12 0438 10/14/12 0814  BP:   142/88   Pulse:   79   Temp:   97.8 F (36.6 C)   TempSrc:   Oral   Resp: 16 12 19 17   Height:      Weight:      SpO2:  98% 98% 98%    Last BM Date: 10/12/12  Intake/Output   Yesterday:  04/11 0701 - 04/12 0700 In: 2410 [P.O.:600; I.V.:1510; IV Piggyback:300] Out: 2600 [Urine:2600] This shift:     Bowel function:  Flatus: n  BM: n  Drain: n/a  Physical Exam:  General: Pt awake/alert/oriented x4 in no acute distress Eyes: PERRL, normal EOM.  Sclera clear.  No icterus Neuro: CN II-XII intact w/o focal sensory/motor deficits. Lymph: No head/neck/groin lymphadenopathy Psych:  No delerium/psychosis/paranoia.  Chipper.  Pleasant HENT: Normocephalic, Mucus membranes moist.  No thrush Neck: Supple, No tracheal deviation Chest: No chest wall pain w good excursion CV:  Pulses intact.  Regular rhythm MS: Normal AROM mjr joints.  No obvious deformity Abdomen: Soft.  Nondistended.  Mildly tender at incision.  Dressing clean dry and intact.  No evidence of peritonitis.  No incarcerated hernias. Ext:  SCDs BLE.  No mjr edema.  No cyanosis Skin: No petechiae / purpura   Problem List:   Principal Problem:   DIVERTICULOSIS OF COLON Active Problems:   Abdominal pain,  generalized   Assessment  Linda Wang  62 y.o. female  2 Days Post-Op  Procedure(s): SIGMOID COLECTOMY for recurrent diverticulitis  Gradually recovering  Plan:  -full liquids -increase non-narcotic pain control -anxiolysis -Tx HTN - B blocker to start -VTE prophylaxis- SCDs, etc -mobilize as tolerated to help recovery  Ardeth Sportsman, M.D., F.A.C.S. [facility] [address], [address] [place], [place] [address] [phone] Main / Paging   10/14/2012   Results:   Labs: Results for orders placed during the hospital encounter of 10/12/12 (from the past 48 hour(s))  CBC     Status: Abnormal   Collection Time    10/12/12  4:31 PM      Result Value Range   WBC 12.8 (*) 4.0 - 10.5 K/uL   RBC 4.41  3.87 - 5.11 MIL/uL   Hemoglobin 13.2  12.0 - 15.0 g/dL   HCT 64.4  03.4 - 74.2 %   MCV 88.4  78.0 - 100.0 fL   MCH 29.9  26.0 - 34.0 pg   MCHC 33.8  30.0 - 36.0 g/dL   RDW 59.5  63.8 - 75.6 %   Platelets 279  150 - 400 K/uL  CREATININE, SERUM     Status: None   Collection Time    10/12/12  4:31 PM  Result Value Range   Creatinine, Ser 0.60  0.50 - 1.10 mg/dL   GFR calc non Af Amer >90  >90 mL/min   GFR calc Af Amer >90  >90 mL/min   Comment:            The eGFR has been calculated     using the CKD EPI equation.     This calculation has not been     validated in all clinical     situations.     eGFR's persistently     <90 mL/min signify     possible Chronic Kidney Disease.  CBC     Status: Abnormal   Collection Time    10/13/12  4:27 AM      Result Value Range   WBC 11.6 (*) 4.0 - 10.5 K/uL   RBC 4.40  3.87 - 5.11 MIL/uL   Hemoglobin 12.9  12.0 - 15.0 g/dL   HCT 16.1  09.6 - 04.5 %   MCV 88.4  78.0 - 100.0 fL   MCH 29.3  26.0 - 34.0 pg   MCHC 33.2  30.0 - 36.0 g/dL   RDW 40.9  81.1 - 91.4 %   Platelets 281  150 - 400 K/uL  BASIC METABOLIC PANEL     Status: Abnormal    Collection Time    10/13/12  4:27 AM      Result Value Range   Sodium 136  135 - 145 mEq/L   Potassium 4.2  3.5 - 5.1 mEq/L   Chloride 103  96 - 112 mEq/L   CO2 26  19 - 32 mEq/L   Glucose, Bld 143 (*) 70 - 99 mg/dL   BUN 7  6 - 23 mg/dL   Creatinine, Ser 7.82  0.50 - 1.10 mg/dL   Calcium 8.4  8.4 - 95.6 mg/dL   GFR calc non Af Amer >90  >90 mL/min   GFR calc Af Amer >90  >90 mL/min   Comment:            The eGFR has been calculated     using the CKD EPI equation.     This calculation has not been     validated in all clinical     situations.     eGFR's persistently     <90 mL/min signify     possible Chronic Kidney Disease.  CBC     Status: None   Collection Time    10/14/12  5:32 AM      Result Value Range   WBC 8.9  4.0 - 10.5 K/uL   RBC 4.16  3.87 - 5.11 MIL/uL   Hemoglobin 12.4  12.0 - 15.0 g/dL   HCT 21.3  08.6 - 57.8 %   MCV 90.9  78.0 - 100.0 fL   MCH 29.8  26.0 - 34.0 pg   MCHC 32.8  30.0 - 36.0 g/dL   RDW 46.9  62.9 - 52.8 %   Platelets 271  150 - 400 K/uL  BASIC METABOLIC PANEL     Status: Abnormal   Collection Time    10/14/12  5:32 AM      Result Value Range   Sodium 138  135 - 145 mEq/L   Potassium 4.0  3.5 - 5.1 mEq/L   Chloride 104  96 - 112 mEq/L   CO2 30  19 - 32 mEq/L   Glucose, Bld 107 (*) 70 - 99 mg/dL   BUN 8  6 - 23  mg/dL   Creatinine, Ser 1.61  0.50 - 1.10 mg/dL   Calcium 8.4  8.4 - 09.6 mg/dL   GFR calc non Af Amer >90  >90 mL/min   GFR calc Af Amer >90  >90 mL/min   Comment:            The eGFR has been calculated     using the CKD EPI equation.     This calculation has not been     validated in all clinical     situations.     eGFR's persistently     <90 mL/min signify     possible Chronic Kidney Disease.    Imaging / Studies: No results found.  Medications / Allergies: per chart  Antibiotics: Anti-infectives   Start     Dose/Rate Route Frequency Ordered Stop   10/12/12 1004  ertapenem (INVANZ) 1 g in sodium chloride 0.9 %  50 mL IVPB     1 g 100 mL/hr over 30 Minutes Intravenous On call to O.R. 10/12/12 1004 10/12/12 1214

## 2012-10-15 MED ORDER — OXYCODONE HCL 5 MG PO TABS
5.0000 mg | ORAL_TABLET | ORAL | Status: DC | PRN
  Administered 2012-10-15 – 2012-10-16 (×2): 5 mg via ORAL
  Filled 2012-10-15 (×2): qty 1

## 2012-10-15 NOTE — Progress Notes (Signed)
Spoke with Dr. Ezzard Standing, informed him patient now asking for stronger pain medication. Oxycodone ordered.

## 2012-10-15 NOTE — Progress Notes (Signed)
Offered to call MD for stronger pain management, but patient states only wants tylenol

## 2012-10-15 NOTE — Progress Notes (Signed)
General Surgery Note  LOS: 3 days  POD -  3 Days Post-Op  Assessment/Plan: 1.  SIGMOID COLECTOMY for diverticulitis - 10/12/2012 - T. Gerkin  On Entereg   On pureed diet  Progressing well.  Not ready to advance diet further.  2.  DVT prophylaxis - Lovenox 3.  Hypertension  Subjective:  A little nausea with some of the food yesterday, but had small bowel movement.  Doing well.  Walking well.  Her pain is controlled with tylenol.  She does not want a narcotic for pain.  She is off the PCA.  Objective:   Filed Vitals:   10/15/12 0545  BP: 132/67  Pulse: 68  Temp: 97.8 F (36.6 C)  Resp: 14     Intake/Output from previous day:  04/12 0701 - 04/13 0700 In: 1200 [I.V.:1200] Out: 850 [Urine:850]  Intake/Output this shift:      Physical Exam:   General: WN older WF who is alert and oriented.    HEENT: Normal. Pupils equal. .   Lungs: Clear   Abdomen: Mild distention.   Wound: Clean.  Dressing looks okay.   Lab Results:    Recent Labs  10/13/12 0427 10/14/12 0532  WBC 11.6* 8.9  HGB 12.9 12.4  HCT 38.9 37.8  PLT 281 271    BMET   Recent Labs  10/13/12 0427 10/14/12 0532  NA 136 138  K 4.2 4.0  CL 103 104  CO2 26 30  GLUCOSE 143* 107*  BUN 7 8  CREATININE 0.53 0.63  CALCIUM 8.4 8.4    PT/INR  No results found for this basename: LABPROT, INR,  in the last 72 hours  ABG  No results found for this basename: PHART, PCO2, PO2, HCO3,  in the last 72 hours   Studies/Results:  No results found.   Anti-infectives:   Anti-infectives   Start     Dose/Rate Route Frequency Ordered Stop   10/12/12 1004  ertapenem (INVANZ) 1 g in sodium chloride 0.9 % 50 mL IVPB     1 g 100 mL/hr over 30 Minutes Intravenous On call to O.R. 10/12/12 1004 10/12/12 1214      Ovidio Kin, MD, FACS Pager: [phone],   [facility] Office: [phone] 10/15/2012

## 2012-10-15 NOTE — Progress Notes (Signed)
Dr. Ezzard Standing on floor spoke with patient about need for po narcotics for pain control patient declines states she only wants tylenol

## 2012-10-16 ENCOUNTER — Telehealth (INDEPENDENT_AMBULATORY_CARE_PROVIDER_SITE_OTHER): Payer: Self-pay

## 2012-10-16 MED ORDER — HYDROCODONE-ACETAMINOPHEN 5-325 MG PO TABS: 1.0000 | ORAL_TABLET | ORAL | Status: DC | PRN

## 2012-10-16 NOTE — Progress Notes (Signed)
Pt for d/c home today.IV d/c'd as ordered. Dressing changed to abdomen-CDI. Tolerated ambulation on hallway. Reported 3 BM's formed since this am. Discharge instructions & Rx given with verbalized understanding. Husband at bedside to assist with d/c.

## 2012-10-16 NOTE — Telephone Encounter (Signed)
Pt home doing well. Po appt made with pt.

## 2012-10-16 NOTE — Discharge Summary (Signed)
Physician Discharge Summary [facility]  Patient ID: JODECI RINI MRN: [id number] DOB/AGE: 1950/11/05 62 y.o.  Admit date: 10/12/2012 Discharge date: 10/16/2012  Admission Diagnoses:  Diverticular disease of sigmoid colon  Discharge Diagnoses:  Principal Problem:   DIVERTICULOSIS OF COLON Active Problems:   Abdominal pain, generalized   Discharged Condition: good  Hospital Course: patient admitted after open sigmoid colectomy.  Post op course straight-forward.  Started clear liquids on POD#1.  Advanced to regular diet.  Ambulatory.  Return of bowel function with BM on POD#3.  Prepared for discharge home on POD#4.  Consults: None  Significant Diagnostic Studies: none  Treatments: surgery: sigmoid colectomy  Discharge Exam: Blood pressure 122/85, pulse 68, temperature 98.6 F (37 C), temperature source Oral, resp. rate 18, height 5' 2.5" (1.588 m), weight 158 lb (71.668 kg), SpO2 98.00%. HEENT - clear Chest - clear bilat Cor - RRR Abd - wound clear and dry; dressings removed; BS present  Disposition: Home with family  Discharge Orders   Future Appointments Provider Department Dept Phone   10/30/2012 11:15 AM Velora Heckler, MD [facility], [place] [phone]   Future Orders Complete By Expires     Diet - low sodium heart healthy  As directed     Discharge instructions  As directed     Comments:      [facility]  OPEN ABDOMINAL SURGERY: POST OP INSTRUCTIONS  Always review your discharge instruction sheet given to you by the facility where your surgery was performed.  A prescription for pain medication may be given to you upon discharge.  Take your pain medication as prescribed.  If narcotic pain medicine is not needed, then you may take acetaminophen (Tylenol) or ibuprofen (Advil) as needed. Take your usually prescribed medications unless otherwise directed. If you need a refill on your pain medication, please contact your  pharmacy. They will contact our office to request authorization.  Prescriptions will not be filled after 5 pm or on weekends. You should follow a light diet the first few days after arrival home, such as soup and crackers, unless your doctor has advised otherwise. A high-fiber, low fat diet can be resumed as tolerated.  Be sure to include plenty of fluids daily.  Most patients will experience some swelling and bruising in the area of the incision. Ice packs will help. Swelling and bruising can take several days to resolve. It is common to experience some constipation if taking pain medication after surgery.  Increasing fluid intake and taking a stool softener will usually help or prevent this problem from occurring.  A mild laxative (Milk of Magnesia or Miralax) should be taken according to package directions if there are no bowel movements after 48 hours.  You may have steri-strips (small skin tapes) in place directly over the incision.  These strips should be left on the skin for 7-10 days.  If your surgeon used skin glue on the incision, you may shower in 24 hours.  The glue will flake off over the next 2-3 weeks.  Any sutures or staples will be removed at the office during your follow-up visit. You may find that a light gauze bandage over your incision may keep your staples from being rubbed or pulled. You may shower and replace the bandage daily. ACTIVITIES:  You may resume regular (light) daily activities beginning the next day-such as daily self-care, walking, climbing stairs-gradually increasing activities as tolerated.  You may have sexual intercourse when it is  comfortable.  Refrain from any heavy lifting or straining until approved by your doctor.  You may drive when you no longer are taking prescription pain medication, you can comfortably wear a seatbelt, and you can safely maneuver your car and apply brakes. You should see your doctor in the office for a follow-up appointment approximately two  weeks after your surgery.  Make sure that you call for this appointment within a day or two after you arrive home to insure a convenient appointment time.  WHEN TO CALL YOUR DOCTOR: Fever greater than 101.0 Inability to urinate Persistent nausea and/or vomiting Extreme swelling or bruising Continued bleeding from incision Increased pain, redness, or drainage from the incision Difficulty swallowing or breathing Muscle cramping or spasms Numbness or tingling in hands or around lips  IF YOU HAVE DISABILITY OR FAMILY LEAVE FORMS, YOU MUST BRING THEM TO THE OFFICE FOR PROCESSING.  PLEASE DO NOT GIVE THEM TO YOUR DOCTOR.  The clinic staff is available to answer your questions during regular business hours.  Please don't hesitate to call and ask to speak to one of the nurses if you have concerns.  [facility], [place] Office: [phone]  For further questions, please visit [email or web address]    Increase activity slowly  As directed     No dressing needed  As directed         Medication List    TAKE these medications       ALPRAZolam 0.5 MG tablet  Commonly known as:  XANAX  Take 0.5 mg by mouth 3 (three) times daily as needed for anxiety.     CALCIUM + D + K PO  Take 1 capsule by mouth daily.     fish oil-omega-3 fatty acids 1000 MG capsule  Take 2 g by mouth daily.     HYDROcodone-acetaminophen 5-325 MG per tablet  Commonly known as:  NORCO/VICODIN  Take 1 tablet by mouth every 6 (six) hours as needed for pain.     HYDROcodone-acetaminophen 5-325 MG per tablet  Commonly known as:  NORCO/VICODIN  Take 1-2 tablets by mouth every 4 (four) hours as needed for pain.     levothyroxine 88 MCG tablet  Commonly known as:  SYNTHROID, LEVOTHROID  Take 1 tablet (88 mcg total) by mouth daily.     mesalamine 1.2 G EC tablet  Commonly known as:  LIALDA  Take 4,800 mg by mouth daily with breakfast.     MINIVELLE 0.075 MG/24HR  Generic drug:  estradiol  Place  1 patch onto the skin 2 (two) times a week.     RED YEAST RICE PO  Take 1 capsule by mouth 2 (two) times daily.     vitamin E 400 UNIT capsule  Take 400 Units by mouth 2 (two) times daily.           Follow-up Information   Schedule an appointment as soon as possible for a visit in 1 week to follow up.      Velora Heckler, MD, [facility] Office: [phone]   Signed: Velora Heckler 10/16/2012, 8:51 AM

## 2012-10-16 NOTE — Progress Notes (Signed)
Quick Note:  Please contact patient and notify of benign pathology results.  Monicka Cyran M. Emilyann Banka, MD, FACS [facility] Office: [phone]   ______ 

## 2012-10-19 ENCOUNTER — Encounter (INDEPENDENT_AMBULATORY_CARE_PROVIDER_SITE_OTHER): Payer: BC Managed Care – PPO | Admitting: Surgery

## 2012-10-23 ENCOUNTER — Encounter (INDEPENDENT_AMBULATORY_CARE_PROVIDER_SITE_OTHER): Payer: Self-pay | Admitting: Surgery

## 2012-10-23 ENCOUNTER — Ambulatory Visit (INDEPENDENT_AMBULATORY_CARE_PROVIDER_SITE_OTHER): Payer: BC Managed Care – PPO | Admitting: Surgery

## 2012-10-23 ENCOUNTER — Encounter (INDEPENDENT_AMBULATORY_CARE_PROVIDER_SITE_OTHER): Payer: BC Managed Care – PPO | Admitting: Surgery

## 2012-10-23 ENCOUNTER — Encounter (INDEPENDENT_AMBULATORY_CARE_PROVIDER_SITE_OTHER): Payer: Self-pay

## 2012-10-23 VITALS — BP 132/80 | HR 88 | Temp 97.5°F | Ht 62.5 in | Wt 158.4 lb

## 2012-10-23 DIAGNOSIS — K5730 Diverticulosis of large intestine without perforation or abscess without bleeding: Secondary | ICD-10-CM

## 2012-10-23 NOTE — Progress Notes (Signed)
General Surgery [facility]  Visit Diagnoses: 1. DIVERTICULOSIS OF COLON     HISTORY: Patient returns for her first postoperative visit having undergone open sigmoid colectomy for diverticular disease. Final pathology report shows diverticulosis with chronic diverticulitis.  EXAM: Midline abdominal incision is healed nicely. Staples were removed and benzoin and Steri-Strips are applied. No sign of infection. No sign of herniation.  IMPRESSION: Status post sigmoid colectomy for diverticular disease  PLAN: Patient is instructed in local wound care. She will remove the Steri-Strips in approximately 7 days and began to apply topical creams to her incision. She may resume aerobic activity as tolerated. She will return in 3 weeks for wound check.  Velora Heckler, MD, FACS General & Endocrine Surgery [facility]

## 2012-10-23 NOTE — Patient Instructions (Signed)
Leave steristrips about 5-7 days, then remove and begin cocoa butter cream as directed below.   COCOA BUTTER & VITAMIN E CREAM  (Palmer's or other brand)  Apply cocoa butter/vitamin E cream to your incision 2 - 3 times daily.  Massage cream into incision for one minute with each application.  Use sunscreen (50 SPF or higher) for first 6 months after surgery if area is exposed to sun.  You may substitute Mederma or other scar reducing creams as desired.

## 2012-10-25 ENCOUNTER — Encounter: Payer: BC Managed Care – PPO | Admitting: Gastroenterology

## 2012-10-30 ENCOUNTER — Telehealth (INDEPENDENT_AMBULATORY_CARE_PROVIDER_SITE_OTHER): Payer: Self-pay

## 2012-10-30 ENCOUNTER — Encounter (INDEPENDENT_AMBULATORY_CARE_PROVIDER_SITE_OTHER): Payer: BC Managed Care – PPO | Admitting: Surgery

## 2012-10-30 NOTE — Telephone Encounter (Signed)
Received request from [facility] for refill synthroid. Per Dr Ardine Eng note re: thyroid states pt to f/u with PCP for thyroid meds and labs. RX faxed back to [facility] with contact # for Dr Alesia Richards Nnodi, pts PCP.

## 2012-11-02 NOTE — Telephone Encounter (Signed)
Pt called and message was relayed regarding her Synthroid Rx.  Pt will call Dr. Ihor Dow.

## 2012-11-13 ENCOUNTER — Encounter (INDEPENDENT_AMBULATORY_CARE_PROVIDER_SITE_OTHER): Payer: BC Managed Care – PPO | Admitting: Surgery

## 2012-11-22 ENCOUNTER — Encounter (INDEPENDENT_AMBULATORY_CARE_PROVIDER_SITE_OTHER): Payer: Self-pay

## 2012-11-22 ENCOUNTER — Ambulatory Visit (INDEPENDENT_AMBULATORY_CARE_PROVIDER_SITE_OTHER): Payer: BC Managed Care – PPO | Admitting: Surgery

## 2012-11-22 ENCOUNTER — Encounter (INDEPENDENT_AMBULATORY_CARE_PROVIDER_SITE_OTHER): Payer: Self-pay | Admitting: Surgery

## 2012-11-22 VITALS — BP 144/92 | HR 76 | Temp 98.7°F | Resp 16 | Ht 62.5 in | Wt 159.6 lb

## 2012-11-22 DIAGNOSIS — K5730 Diverticulosis of large intestine without perforation or abscess without bleeding: Secondary | ICD-10-CM

## 2012-11-22 DIAGNOSIS — K648 Other hemorrhoids: Secondary | ICD-10-CM

## 2012-11-22 MED ORDER — HYDROCORTISONE ACETATE 25 MG RE SUPP: 25.0000 mg | Freq: Two times a day (BID) | RECTAL | Status: DC

## 2012-11-22 NOTE — Progress Notes (Signed)
General Surgery [facility]  Visit Diagnoses: 1. DIVERTICULOSIS OF COLON   2. Hemorrhoids, internal, with bleeding     HISTORY: Patient returns for followup having undergone sigmoid colectomy for diverticular disease. She continues to have 3-4 soft bowel movements daily. She denies any bleeding with bowel movements. She is having problems with her hemorrhoids 22 frequent bowel movements. She has had intermittent bleeding.  EXAM: Examination the abdomen shows a well-healed midline surgical incision. No sign of herniation. No sign of infection. Left lower quadrant is soft and nontender.  IMPRESSION: #1 status post sigmoid colectomy for diverticular disease #2 internal hemorrhoids with bleeding  PLAN: I will give her a prescription for Anusol HC suppositories to use for 10 days. Hopefully this will help with the hemorrhoid symptoms. I've asked her to avoid any lifting for another 3 weeks. We will keep her out of work for June 15 as her job does require lifting.  I plan to evaluate her again in 3 months.  Velora Heckler, MD, FACS General & Endocrine Surgery [facility]

## 2012-11-22 NOTE — Patient Instructions (Signed)
  COCOA BUTTER & VITAMIN E CREAM  (Palmer's or other brand)  Apply cocoa butter/vitamin E cream to your incision 2 - 3 times daily.  Massage cream into incision for one minute with each application.  Use sunscreen (50 SPF or higher) for first 6 months after surgery if area is exposed to sun.  You may substitute Mederma or other scar reducing creams as desired.   

## 2012-11-28 ENCOUNTER — Telehealth (INDEPENDENT_AMBULATORY_CARE_PROVIDER_SITE_OTHER): Payer: Self-pay

## 2012-11-28 ENCOUNTER — Encounter (INDEPENDENT_AMBULATORY_CARE_PROVIDER_SITE_OTHER): Payer: Self-pay

## 2012-11-28 NOTE — Telephone Encounter (Signed)
Patient states she needs date change of her going back to work to 12/19/12 she will call back with fax number. She still is having intermittent bleeding . Advised to continue her Anusol and sitz baths . To call if any other concerns / questions

## 2012-11-29 ENCOUNTER — Telehealth (INDEPENDENT_AMBULATORY_CARE_PROVIDER_SITE_OTHER): Payer: Self-pay

## 2012-11-29 NOTE — Telephone Encounter (Signed)
Patient called with fax number of [phone] Attn: [facility] Patten for RTW letter to be dated 12/19/12

## 2012-11-29 NOTE — Telephone Encounter (Signed)
Pt advised fax to # given for rtw will not go thru. Pt will pick up rtw note and take it to employer. Note at front desk to pick up.

## 2013-01-24 ENCOUNTER — Ambulatory Visit (INDEPENDENT_AMBULATORY_CARE_PROVIDER_SITE_OTHER): Payer: BC Managed Care – PPO | Admitting: Surgery

## 2013-01-24 ENCOUNTER — Encounter (INDEPENDENT_AMBULATORY_CARE_PROVIDER_SITE_OTHER): Payer: Self-pay | Admitting: Surgery

## 2013-01-24 VITALS — BP 140/80 | HR 76 | Temp 98.2°F | Resp 15 | Ht 62.0 in | Wt 164.8 lb

## 2013-01-24 DIAGNOSIS — Z85850 Personal history of malignant neoplasm of thyroid: Secondary | ICD-10-CM

## 2013-01-24 DIAGNOSIS — K5730 Diverticulosis of large intestine without perforation or abscess without bleeding: Secondary | ICD-10-CM

## 2013-01-24 NOTE — Patient Instructions (Signed)

## 2013-01-24 NOTE — Progress Notes (Signed)
General Surgery [facility]  Visit Diagnoses: 1. DIVERTICULOSIS OF COLON   2. History of thyroid cancer     HISTORY: Patient is a 62 year old female who underwent sigmoid colectomy for diverticular disease in April 2014. She has a history of thyroid malignancy and underwent thyroid lobectomy. We continued to follow her with thyroid ultrasound and laboratory studies. She returns today for evaluation.  PERTINENT REVIEW OF SYSTEMS: Patient tolerating regular diet. Having between 1 and 4 regular bowel movements daily. Hemorrhoid problems have improved. No bleeding.  EXAM: HEENT: normocephalic; pupils equal and reactive; sclerae clear; dentition good; mucous membranes moist NECK:  No palpable nodules in the thyroid bed; well-healed incision; symmetric on extension; no palpable anterior or posterior cervical lymphadenopathy; no supraclavicular masses; no tenderness CHEST: clear to auscultation bilaterally without rales, rhonchi, or wheezes CARDIAC: regular rate and rhythm without significant murmur; peripheral pulses are full ABDOMEN: Soft without distention; midline incision well-healed without evidence of herniation; small rectus diastases; no palpable masses; no tenderness EXT:  non-tender without edema; no deformity NEURO: no gross focal deficits; no sign of tremor   IMPRESSION: #1 status post sigmoid colectomy for diverticular disease #2 personal history of thyroid cancer, no evidence of recurrent disease  PLAN: Patient will undergo a thyroid ultrasound before the end of the year. I will see her back in the office to evaluate her as a followup of her thyroid cancer following that study.  As for her abdominal surgery, she is released to full activity without restriction.  Velora Heckler, MD, [facility] Office: [phone]

## 2013-01-25 ENCOUNTER — Telehealth (INDEPENDENT_AMBULATORY_CARE_PROVIDER_SITE_OTHER): Payer: Self-pay

## 2013-01-25 NOTE — Telephone Encounter (Signed)
Pt calling in b/c when she got home from appt yesterday with Dr Gerrit Friends she noticed on the AVS it showed in the diagnosis area that she had a hx of thyroid cancer. The pt states that she was never told she had thyroid cancer from when Dr Earlene Plater did her surgery back in 2001. The pt was told the areas were precancerous not cancer. The pt is getting upset now b/c she wasn't told she had thyroid cancer. I told the pt that I did not have her old chart and pathology report in front of me to verify the diagnosis but I did go back in Dr Sid Falcon note from last year 2013. The note from Dr Gerrit Friends last yeart stated that the pt had thyroid surgery by Dr Earlene Plater in 2001 for thyroid cancer. The pt wants clarification on the diagnosis. Pls call pt.

## 2013-01-26 NOTE — Telephone Encounter (Signed)
I was unable to reach pt via phone. Only # is a friend. I LMOM for pt to call back. Per Dr Sid Falcon note from today attached to this note and his request I have mailed the copy of pts 2001 path report that Dr Gerrit Friends asked me to mail to pt. If pt returns call we need to get phone # where pt can be reached to return her calls.

## 2013-01-26 NOTE — Telephone Encounter (Signed)
Reviewed pathology from March 2001.  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  She has been followed for this regularly ever since.  She knew about this diagnosis.  I continue to follow her after the retirement of her surgeon, Dr. Kendrick Ranch.  No evidence of recurrent disease.  Will inform patient again and provide her with a copy of the pathology report.  Velora Heckler, MD, [facility] Office: [phone]

## 2013-01-26 NOTE — Telephone Encounter (Signed)
Attempted to mail path and no address in epic. I will ask med records to scan report. Unable to respond to pts concern.

## 2013-01-26 NOTE — Telephone Encounter (Signed)
Pt returned call. Demographics corrected in epic and pt spoke with Dr Gerrit Friends. Copy of path from 2001 mailed to address given.

## 2013-03-29 ENCOUNTER — Telehealth: Payer: Self-pay | Admitting: Gastroenterology

## 2013-03-29 ENCOUNTER — Telehealth (INDEPENDENT_AMBULATORY_CARE_PROVIDER_SITE_OTHER): Payer: Self-pay | Admitting: General Surgery

## 2013-03-29 NOTE — Telephone Encounter (Signed)
Pt called for earliest appt with Dr. Gerrit Friends.  She had partial colectomy in April 2014 for diverticulosis.  She has done well since, but has developed new pain in her abdomen.  Pt contacted Dr. Norval Gable office, and was told that he is out of the office for the next 2 weeks and she should call Dr. Gerrit Friends.  Explained that Dr. Gerrit Friends does not do the type of work up she needs to identify and evaluate the source of her pain.  If she needs consequentially needs to consult a surgeon, we will get her in then.  Pt understands and will call Dr. Norval Gable office back, to maybe see one of his partners.

## 2013-03-29 NOTE — Telephone Encounter (Signed)
Pt had COLON in April, 2014 showing severe diverticulitis so she was referred to Dr Gerrit Friends. Pt had surgery on 10/12/12, Sigmoid Colectomy and was released 01/24/13.  She states for the past week or so she's had terrible pain and soreness in her entire a;ower abdomen. She has an incision from her navel to the pubic area and that entire area is sore and painful. I asked pt to call [facility] for their opinion, but we will gladly have a PA see her if they won't. Called pt back and Dr Gerrit Friends is not in and [facility] will not see her. She has an appt tomorrow with her PCP. Informed her there is a virus going around, does she have diarrhea and she does; states she's had it all week. If still in trouble next week she will call us.

## 2013-04-05 ENCOUNTER — Telehealth: Payer: Self-pay | Admitting: Gastroenterology

## 2013-04-05 MED ORDER — CIPROFLOXACIN HCL 500 MG PO TABS: 500.0000 mg | ORAL_TABLET | Freq: Two times a day (BID) | ORAL | Status: DC

## 2013-04-05 NOTE — Telephone Encounter (Signed)
Linna Hoff, RN at 03/29/2013 3:24 PM   Status: Signed            Pt had COLON in April, 2014 showing severe diverticulitis so she was referred to Dr Gerrit Friends. Pt had surgery on 10/12/12, Sigmoid Colectomy and was released 01/24/13. She states for the past week or so she's had terrible pain and soreness in her entire a;ower abdomen. She has an incision from her navel to the pubic area and that entire area is sore and painful. I asked pt to call [facility] for their opinion, but we will gladly have a PA see her if they won't.  Called pt back and Dr Gerrit Friends is not in and [facility] will not see her. She has an appt tomorrow with her PCP. Informed her there is a virus going around, does she have diarrhea and she does; states she's had it all week. If still in trouble next week she will call us.       Pt reports she could not get in with her PCP and is no better and she reports last night she passed blood in her stool. Again, she denies diarrhea and a temp. She states this is the way she feels with her diverticular episodes. Spoke with Mike Gip, PA who viewed her last COLON and she does have tics in other areas. She will pre treat pt with Cipro 500mg  BID x 10 days, but she wants pt to keep her 04/09/13 appt with Amy. Pt states understanding.

## 2013-04-06 ENCOUNTER — Encounter: Payer: Self-pay | Admitting: *Deleted

## 2013-04-09 ENCOUNTER — Ambulatory Visit (INDEPENDENT_AMBULATORY_CARE_PROVIDER_SITE_OTHER): Payer: BC Managed Care – PPO | Admitting: Physician Assistant

## 2013-04-09 ENCOUNTER — Encounter: Payer: Self-pay | Admitting: Physician Assistant

## 2013-04-09 ENCOUNTER — Other Ambulatory Visit (INDEPENDENT_AMBULATORY_CARE_PROVIDER_SITE_OTHER): Payer: BC Managed Care – PPO

## 2013-04-09 VITALS — BP 128/80 | HR 98 | Ht 62.5 in | Wt 163.0 lb

## 2013-04-09 DIAGNOSIS — Z8719 Personal history of other diseases of the digestive system: Secondary | ICD-10-CM

## 2013-04-09 DIAGNOSIS — R1032 Left lower quadrant pain: Secondary | ICD-10-CM

## 2013-04-09 DIAGNOSIS — R1031 Right lower quadrant pain: Secondary | ICD-10-CM

## 2013-04-09 DIAGNOSIS — K5730 Diverticulosis of large intestine without perforation or abscess without bleeding: Secondary | ICD-10-CM

## 2013-04-09 LAB — URINALYSIS
Bilirubin Urine: NEGATIVE
Hgb urine dipstick: NEGATIVE
Ketones, ur: NEGATIVE
Leukocytes, UA: NEGATIVE
Specific Gravity, Urine: <=1.005 (ref 1.000–1.030)
Urine Glucose: NEGATIVE
Urobilinogen, UA: 0.2 (ref 0.0–1.0)

## 2013-04-09 LAB — CBC WITH DIFFERENTIAL/PLATELET
Basophils Absolute: 0 10*3/uL (ref 0.0–0.1)
Basophils Relative: 0.5 % (ref 0.0–3.0)
Eosinophils Absolute: 0.1 10*3/uL (ref 0.0–0.7)
Lymphocytes Relative: 28 % (ref 12.0–46.0)
MCHC: 34 g/dL (ref 30.0–36.0)
Monocytes Absolute: 0.4 10*3/uL (ref 0.1–1.0)
Neutrophils Relative %: 63.4 % (ref 43.0–77.0)
Platelets: 289 10*3/uL (ref 150.0–400.0)
RBC: 4.58 Mil/uL (ref 3.87–5.11)
RDW: 12.5 % (ref 11.5–14.6)

## 2013-04-09 LAB — BASIC METABOLIC PANEL
BUN: 11 mg/dL (ref 6–23)
CO2: 26 mEq/L (ref 19–32)
Calcium: 9 mg/dL (ref 8.4–10.5)
Creatinine, Ser: 0.6 mg/dL (ref 0.4–1.2)
GFR: 99.94 mL/min (ref >60.00)
Glucose, Bld: 87 mg/dL (ref 70–99)
Sodium: 133 mEq/L — ABNORMAL LOW (ref 135–145)

## 2013-04-09 NOTE — Progress Notes (Signed)
Agree with initial assessment and plans. Amy will followup on CT

## 2013-04-09 NOTE — Patient Instructions (Addendum)
Please go to the basement level to have your labs drawn.  Stay on the Cipro and finish this course of antibiotic. Use the Ultram ( Tramadol ) for pain.  You have been scheduled for a CT scan of the abdomen and pelvis at [facility] ([address]---this is in the same building as Architectural technologist).   You are scheduled on 04-10-2013 at 3:00 am . You should arrive 15 minutes prior to your appointment time for registration. Please follow the written instructions below on the day of your exam:  WARNING: IF YOU ARE ALLERGIC TO IODINE/X-RAY DYE, PLEASE NOTIFY RADIOLOGY IMMEDIATELY AT [phone]! YOU WILL BE GIVEN A 13 HOUR PREMEDICATION PREP.  1) Do not eat or drink anything after 11:00 am  (4 hours prior to your test) 2) You have been given 2 bottles of oral contrast to drink. The solution may taste better if refrigerated, but do NOT add ice or any other liquid to this solution. Shake well before drinking.    Drink 1 bottle of contrast @ 1:00 PM  (2 hours prior to your exam)  Drink 1 bottle of contrast @ 2:00 PM  (1 hour prior to your exam)  You may take any medications as prescribed with a small amount of water except for the following: Metformin, Glucophage, Glucovance, Avandamet, Riomet, Fortamet, Actoplus Met, Janumet, Glumetza or Metaglip. The above medications must be held the day of the exam AND 48 hours after the exam.  The purpose of you drinking the oral contrast is to aid in the visualization of your intestinal tract. The contrast solution may cause some diarrhea. Before your exam is started, you will be given a small amount of fluid to drink. Depending on your individual set of symptoms, you may also receive an intravenous injection of x-ray contrast/dye. Plan on being at [facility] for 30 minutes or long, depending on the type of exam you are having performed.  If you have any questions regarding your exam or if you need to reschedule, you may call the CT  department at [phone] between the hours of 8:00 am and 5:00 pm, Monday-Friday.  ________________________________________________________________________

## 2013-04-09 NOTE — Progress Notes (Signed)
Subjective:    Patient ID: Linda Wang, female    DOB: 1951/05/08, 62 y.o.   MRN: [id number]  HPI  Linda Wang  is a pleasant 62 year old white female known to Dr. Jarold Motto with history of recurrent diverticulitis. She had undergone sigmoid colectomy in April 2014 do to repeated episodes of diverticulitis. She has been followed and released by Dr. Gerrit Friends and did well postoperatively. She had colonoscopy in early April 2014 that showed severe diverticular changes in the descending through sigmoid colon. She comes in today complaining of 2 week history of lower abdominal discomfort. She says the pain is constant and low level and has been associated with some nausea and seems to be worse with larger amounts of food. She says her appetite has been decreased and she's been eating less she thinks because of fear of making the pain worse. She's not had any associated fever or chills. She says not had any distinct changes in her bowel habits though tends to alternate some. She saw a little bit of blood last week on the tissue which she feels was due to a hemorrhoid. She says this feels fairly similar to how her previous diverticulitis felt though is located more in the right lower quadrant than the left. She has not had any urinary symptoms. She had called on Friday 10/ 3 and was empirically started on Cipro 500 mg by mouth twice daily. She says she's been taking medication all weekend but has not noticed any improvement in her symptoms.     Review of Systems  Constitutional: Positive for appetite change.  HENT: Negative.   Eyes: Negative.   Respiratory: Negative.   Cardiovascular: Negative.   Gastrointestinal: Positive for abdominal pain.  Endocrine: Negative.   Genitourinary: Negative.   Musculoskeletal: Negative.   Skin: Negative.   Allergic/Immunologic: Negative.   Neurological: Negative.   Hematological: Negative.   Psychiatric/Behavioral: Negative.    Outpatient Prescriptions Prior to Visit   Medication Sig Dispense Refill  . ALPRAZolam (XANAX) 0.5 MG tablet Take 0.5 mg by mouth 3 (three) times daily as needed for anxiety.      . Calcium-Vitamin D-Vitamin K (CALCIUM + D + K PO) Take 1 capsule by mouth daily.      . ciprofloxacin (CIPRO) 500 MG tablet Take 1 tablet (500 mg total) by mouth 2 (two) times daily.  20 tablet  0  . estradiol (MINIVELLE) 0.075 MG/24HR Place 1 patch onto the skin 2 (two) times a week.      . fish oil-omega-3 fatty acids 1000 MG capsule Take 2 g by mouth daily.      Marland Kitchen levothyroxine (SYNTHROID, LEVOTHROID) 88 MCG tablet Take 1 tablet (88 mcg total) by mouth daily.  90 tablet  3  . meloxicam (MOBIC) 15 MG tablet       . Red Yeast Rice Extract (RED YEAST RICE PO) Take 1 capsule by mouth 2 (two) times daily.      . vitamin E 400 UNIT capsule Take 400 Units by mouth 2 (two) times daily.      . diazepam (VALIUM) 2 MG tablet       . LIALDA 1.2 G EC tablet       . traMADol (ULTRAM) 50 MG tablet        No facility-administered medications prior to visit.   Allergies  Allergen Reactions  . Statins     Give pt GERD  . Sulfonamide Derivatives Hives   Patient Active Problem List   Diagnosis Date Noted  .  Hemorrhoids, internal, with bleeding 11/22/2012  . Seasonal and perennial allergic rhinitis 05/13/2012  . History of thyroid cancer 09/20/2011  . REFLUX, ESOPHAGEAL 09/10/2009  . LUQ PAIN 08/26/2009  . ADENOMATOUS COLONIC POLYP 08/05/2009  . DIVERTICULOSIS OF COLON 08/05/2009  . Abdominal pain, generalized 08/05/2009  . HYPOTHYROIDISM 08/01/2009  . HYPERCHOLESTEROLEMIA 08/01/2009  . ANXIETY DEPRESSION 08/01/2009  . HYPERTENSION 08/01/2009   History  Substance Use Topics  . Smoking status: Never Smoker   . Smokeless tobacco: Never Used  . Alcohol Use: Yes     Comment: occassionally   family history includes Bladder Cancer in her father; Colon cancer in her father; Colon polyps in her brother; Colonic polyp in her brother; Diabetes in her mother;  Heart disease in her mother; Prostate cancer in her brother and brother; Thyroid cancer in her mother.     Objective:   Physical Exam white female in no acute distress, pleasant blood pressure 128/80 pulse 98 height 5 foot 2 weight 163. HEENT; nontraumatic normocephalic EOMI PERRLA sclera anicteric, Supple; no JVD, Cardiovascular; regular rate and rhythm with S1-S2 no murmur or gallop, Pulmonary; clear bilaterally, Abdomen; soft bowel sounds are present but somewhat hypoactive she has a midline incisional scar she is tender to palpation over the incisional scar and also tender in the right greater than left lower quadrant ,no guarding or rebound no palpable mass or hepatosplenomegaly, Rectal; exam not done, Extremities; no clubbing cyanosis or edema skin warm and dry, Psych; mood and affect normal and appropriate        Assessment & Plan:   #69  62 year old female status post sigmoid colectomy April 2014 for recurrent diverticulitis, now presenting with 2 week history of lower abdominal pain right greater than left.. Etiology of her pain is not clear we'll need to rule out recurrent diverticulitis in remaining portion of her descending colon versus other intra-abdominal inflammatory process. #2 history of thyroid cancer #3 history of adenomatous colon polyps-no polyps on last colonoscopy April 2014 #4 hypothyroidism #5 hypertension  Plan; CBC with differential, BMET,UA today Schedule for CT scan of the abdomen and pelvis within the next 24 hours Continue Cipro 500 twice a day to complete course Offered an analgesic but she has Ultram at home which she has not tried and will  use this as needed for pain Further plans and alteration in antibiotic regimen pending results of CT

## 2013-04-10 ENCOUNTER — Ambulatory Visit (INDEPENDENT_AMBULATORY_CARE_PROVIDER_SITE_OTHER)
Admission: RE | Admit: 2013-04-10 | Discharge: 2013-04-10 | Disposition: A | Discharge status: Home / Self Care | Payer: BC Managed Care – PPO | Source: Ambulatory Visit | Attending: Physician Assistant | Admitting: Physician Assistant

## 2013-04-10 DIAGNOSIS — R1032 Left lower quadrant pain: Secondary | ICD-10-CM

## 2013-04-10 DIAGNOSIS — R1031 Right lower quadrant pain: Secondary | ICD-10-CM

## 2013-04-10 DIAGNOSIS — Z8719 Personal history of other diseases of the digestive system: Secondary | ICD-10-CM

## 2013-04-10 MED ORDER — IOHEXOL 300 MG/ML  SOLN
100.0000 mL | Freq: Once | INTRAMUSCULAR | Status: AC | PRN
  Administered 2013-04-10: 100 mL via INTRAVENOUS

## 2013-04-12 ENCOUNTER — Telehealth: Payer: Self-pay | Admitting: Physician Assistant

## 2013-04-12 NOTE — Telephone Encounter (Signed)
See result note.  

## 2013-04-13 ENCOUNTER — Other Ambulatory Visit: Payer: Self-pay | Admitting: *Deleted

## 2013-04-13 MED ORDER — METRONIDAZOLE 500 MG PO TABS: ORAL_TABLET | ORAL | Status: DC

## 2013-04-20 ENCOUNTER — Telehealth: Payer: Self-pay | Admitting: Gastroenterology

## 2013-04-20 MED ORDER — FLUCONAZOLE 100 MG PO TABS: 150.0000 mg | ORAL_TABLET | Freq: Once | ORAL | Status: DC

## 2013-04-20 NOTE — Telephone Encounter (Signed)
Can we order something for pt's yeast infection; taking Cipro and Flagyl? Thanks

## 2013-04-20 NOTE — Telephone Encounter (Signed)
Pt reports vaginal yeast so Dr Jarold Motto prescribed Diflucan 150 mg tab x 1. Pt notified.

## 2013-04-20 NOTE — Telephone Encounter (Signed)
nystatin is good

## 2013-04-27 ENCOUNTER — Other Ambulatory Visit (INDEPENDENT_AMBULATORY_CARE_PROVIDER_SITE_OTHER): Payer: Self-pay

## 2013-04-27 ENCOUNTER — Telehealth (INDEPENDENT_AMBULATORY_CARE_PROVIDER_SITE_OTHER): Payer: Self-pay

## 2013-04-27 DIAGNOSIS — K6289 Other specified diseases of anus and rectum: Secondary | ICD-10-CM

## 2013-04-27 MED ORDER — HYDROCORTISONE ACETATE 25 MG RE SUPP: 25.0000 mg | Freq: Two times a day (BID) | RECTAL | Status: DC

## 2013-04-27 NOTE — Telephone Encounter (Signed)
Pt called for clarification of below message.  I relayed that her Rx has been sent electronically to [facility] pharmacy.  Pt states that the last time she had this medication [facility] did not carry it so she had to have it sent to [facility].  Pt stated that she will call [facility] and see if they filled it.

## 2013-04-27 NOTE — Telephone Encounter (Signed)
LMOM for pt to call back. Pt needs to be advised we Received refill request for hydrocort ax 25 mg sup. Refill Berkley Harvey was sent to [facility] via electronic rx. This was the pharmacy listed in epic.

## 2013-05-04 ENCOUNTER — Telehealth (INDEPENDENT_AMBULATORY_CARE_PROVIDER_SITE_OTHER): Payer: Self-pay | Admitting: *Deleted

## 2013-05-04 ENCOUNTER — Encounter (INDEPENDENT_AMBULATORY_CARE_PROVIDER_SITE_OTHER): Payer: Self-pay | Admitting: *Deleted

## 2013-05-04 NOTE — Telephone Encounter (Signed)
LMOM for pt to return my call.  I was calling to remind pt of her thyroid US appt at [facility] on 05/28/13 at 3:00pm.  I also wanted to inform pt that I scheduled her a return office visit with Dr. Gerrit Friends on 05/30/13 at 10:30am.  I will mail a reminder letter as well.

## 2013-05-28 ENCOUNTER — Other Ambulatory Visit (HOSPITAL_COMMUNITY): Payer: BC Managed Care – PPO

## 2013-05-28 ENCOUNTER — Ambulatory Visit (HOSPITAL_COMMUNITY)
Admission: RE | Admit: 2013-05-28 | Discharge: 2013-05-28 | Disposition: A | Discharge status: Home / Self Care | Payer: BC Managed Care – PPO | Source: Ambulatory Visit | Attending: Surgery | Admitting: Surgery

## 2013-05-28 DIAGNOSIS — E042 Nontoxic multinodular goiter: Secondary | ICD-10-CM | POA: Insufficient documentation

## 2013-05-28 DIAGNOSIS — Z85850 Personal history of malignant neoplasm of thyroid: Secondary | ICD-10-CM | POA: Insufficient documentation

## 2013-05-30 ENCOUNTER — Ambulatory Visit (INDEPENDENT_AMBULATORY_CARE_PROVIDER_SITE_OTHER): Payer: BC Managed Care – PPO | Admitting: Surgery

## 2013-06-03 IMAGING — CT CT ABD-PELV W/ CM
2 of 5 series · 17 of 46 positions shown, 19 images · IV contrast (Omnipaque 300)
Comparison: 12/23/2011

CLINICAL DATA: Chronic abdominal pain with bloating and nausea.
Constipation and diarrhea.

CT ABDOMEN AND PELVIS WITH CONTRAST
TECHNIQUE: Multidetector CT imaging of the abdomen and pelvis was
performed following the standard protocol during bolus
administration of intravenous contrast.
Contrast: 100mL OMNIPAQUE IOHEXOL 300 MG/ML  SOLN

[Series 2: abd/ pel 5mm · axial · 0.66mm/px · z∈[-412,-42]mm · 14 of 84 slices shown, 16 images]
[im 5/84  soft-tissue]
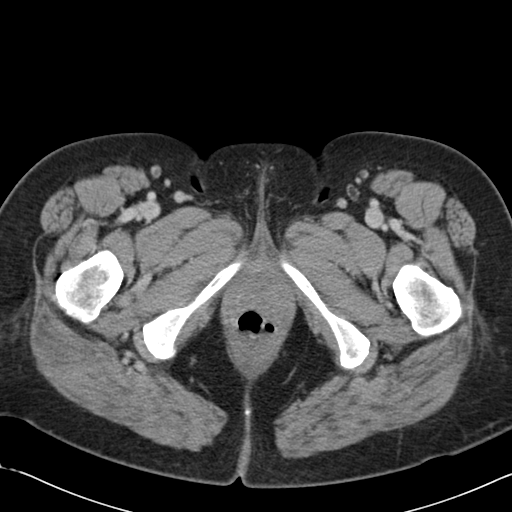
[im 5/84  bone]
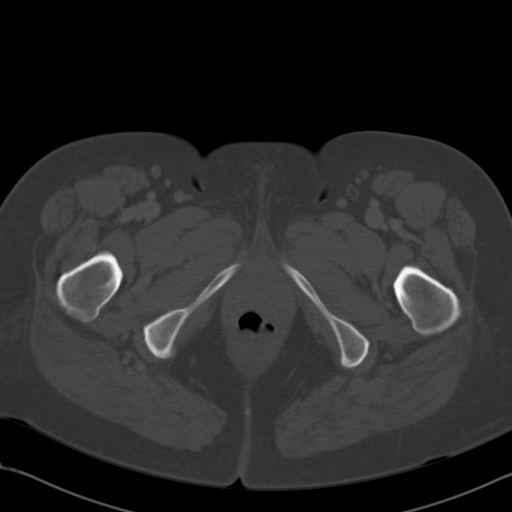
[im 9/84  soft-tissue]
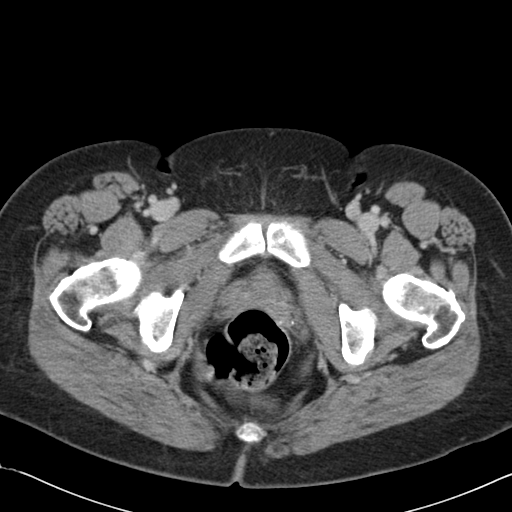
[im 18/84  soft-tissue]
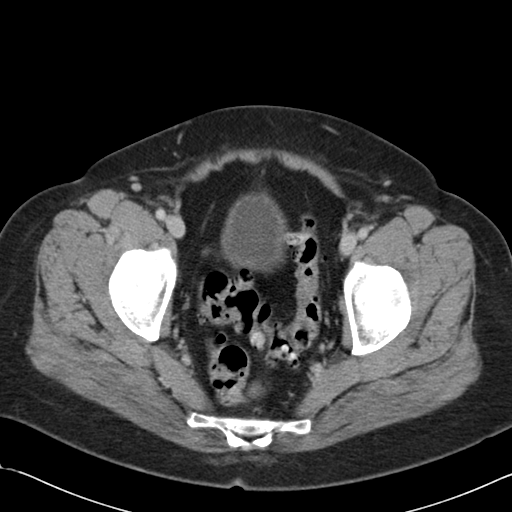
[im 22/84  soft-tissue]
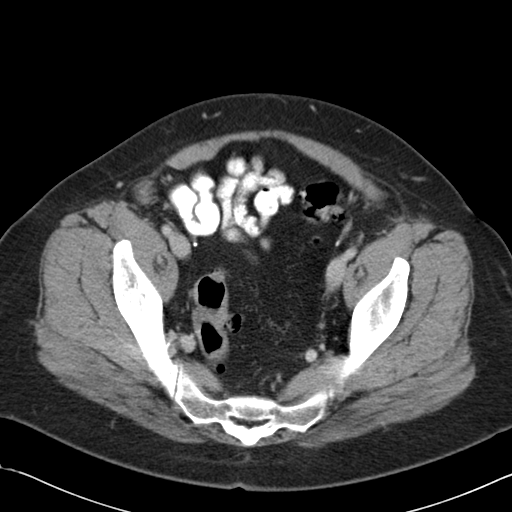
[im 27/84  soft-tissue]
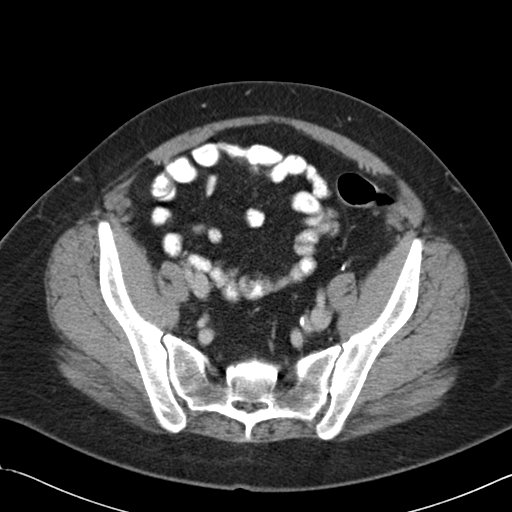
[im 35/84  soft-tissue]
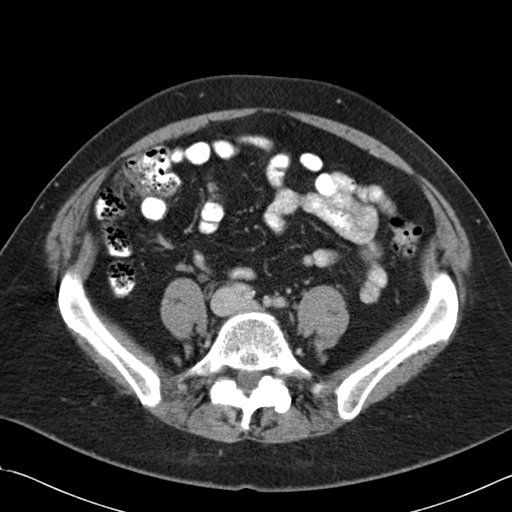
[im 40/84  soft-tissue]
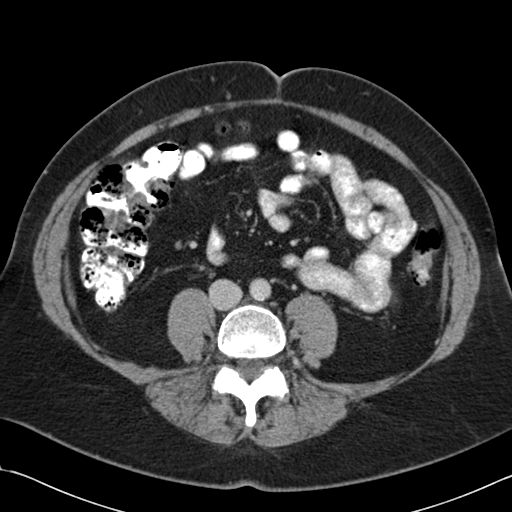
[im 44/84  soft-tissue]
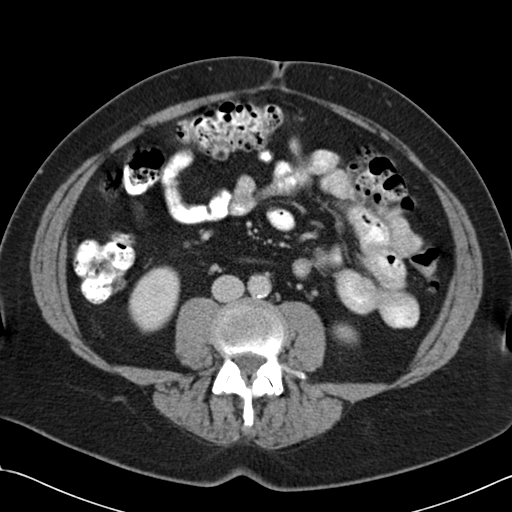
[im 49/84  soft-tissue]
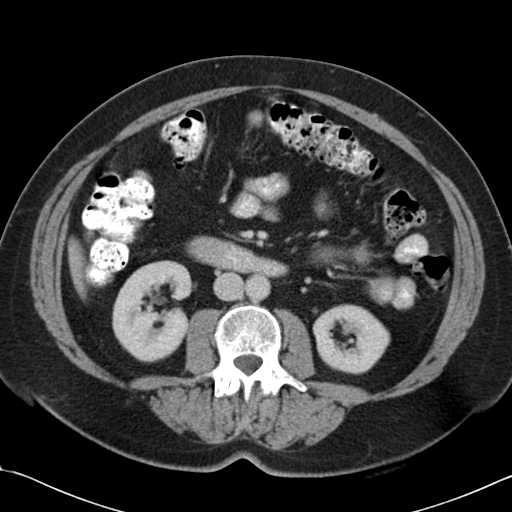
[im 49/84  bone]
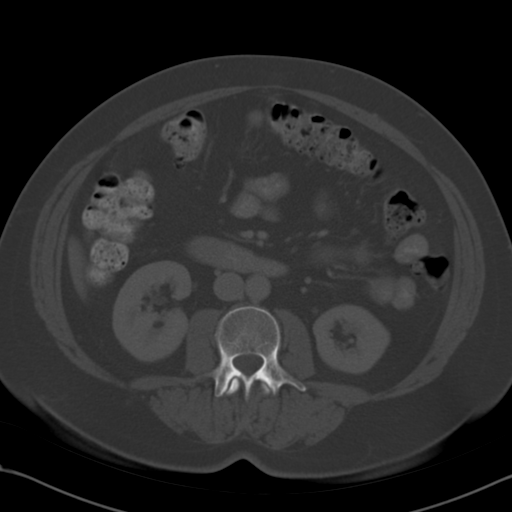
[im 57/84  soft-tissue]
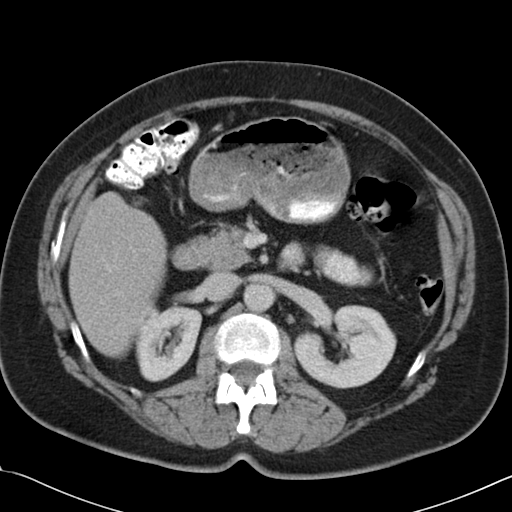
[im 62/84  soft-tissue]
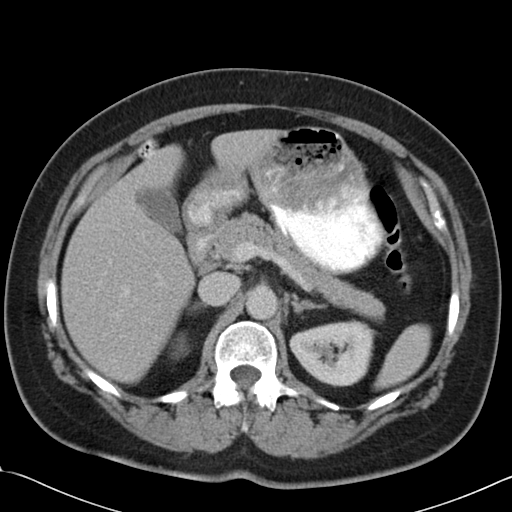
[im 66/84  soft-tissue]
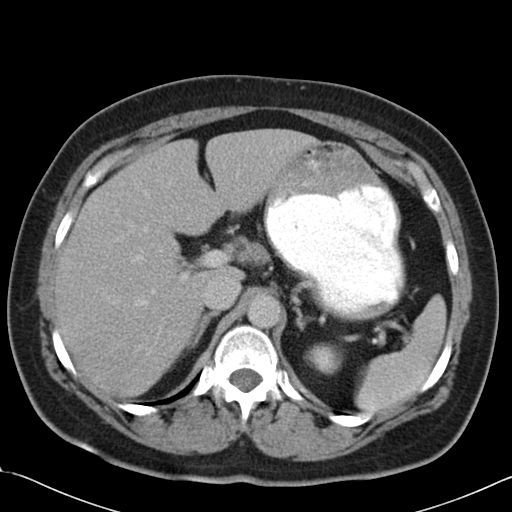
[im 75/84  soft-tissue]
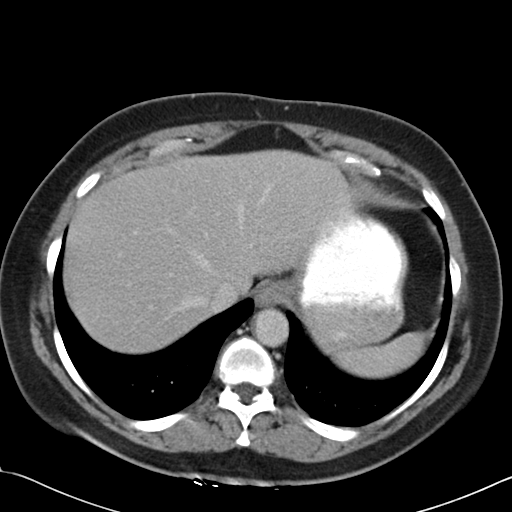
[im 79/84  soft-tissue]
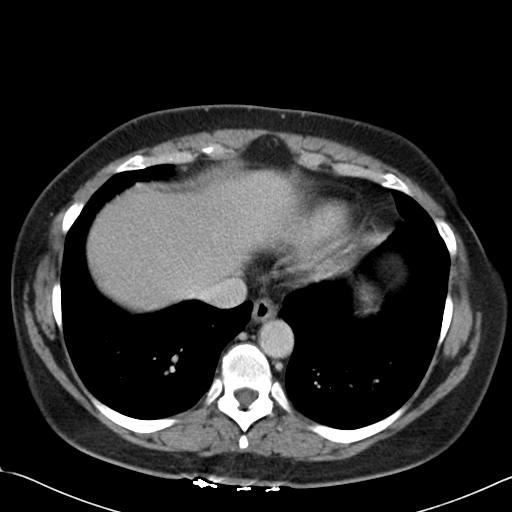

[Series 602: cor · coronal · 0.84mm/px · 3 of 126 slices shown]
[im 42/126  soft-tissue]
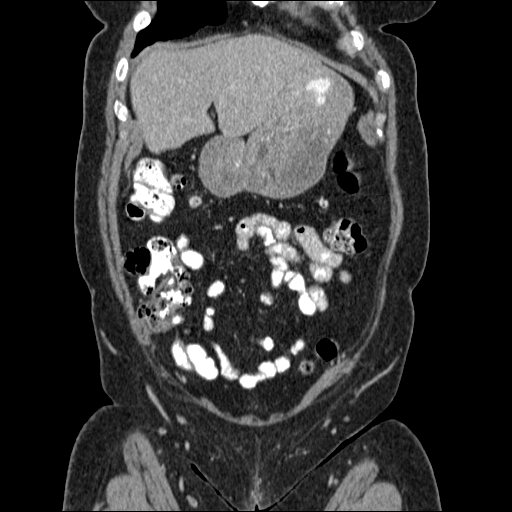
[im 56/126  soft-tissue]
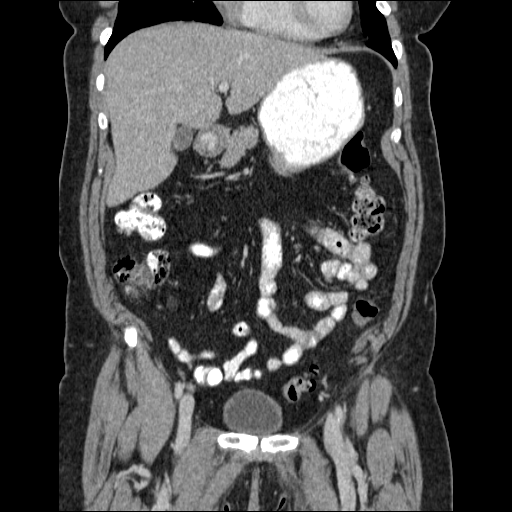
[im 70/126  soft-tissue]
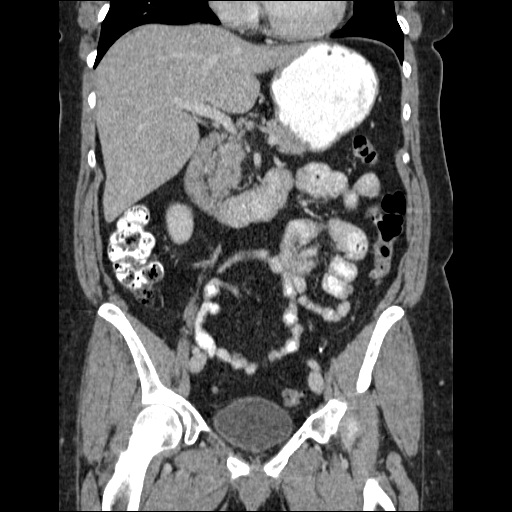

[17 of 46 positions shown; findings below may reference images not displayed]

FINDINGS: Lung bases:  Normal

Abdomen/pelvis:  Normal liver, spleen, stomach, pancreas,
gallbladder, biliary tract, right adrenal gland, kidneys.

Similar mild left adrenal nodularity involving the lateral limb.

No retroperitoneal or retrocrural adenopathy.

Extensive colonic diverticulosis, with muscular hypertrophy
involving the sigmoid.  Prior appendectomy. Normal terminal ileum.
Normal small bowel without abdominal ascites.

No pelvic adenopathy.  Mild pelvic floor laxity.  Hysterectomy.
Otherwise normal urinary bladder, without adnexal mass or
significant free pelvic fluid.

Bones/Musculoskeletal:  Probable bone island in the right pubis,
unchanged.  Advanced degenerative disc disease at the lumbosacral
junction.
IMPRESSION: 1. No acute process in the abdomen or pelvis.
2.  Mild pelvic floor laxity.

## 2013-06-05 ENCOUNTER — Encounter (INDEPENDENT_AMBULATORY_CARE_PROVIDER_SITE_OTHER): Payer: Self-pay | Admitting: Surgery

## 2013-06-05 ENCOUNTER — Ambulatory Visit (INDEPENDENT_AMBULATORY_CARE_PROVIDER_SITE_OTHER): Payer: BC Managed Care – PPO | Admitting: Surgery

## 2013-06-05 VITALS — BP 150/86 | HR 100 | Temp 97.8°F | Resp 20 | Ht 63.0 in | Wt 161.0 lb

## 2013-06-05 DIAGNOSIS — Z85850 Personal history of malignant neoplasm of thyroid: Secondary | ICD-10-CM

## 2013-06-05 DIAGNOSIS — K5730 Diverticulosis of large intestine without perforation or abscess without bleeding: Secondary | ICD-10-CM

## 2013-06-05 NOTE — Patient Instructions (Signed)
Apply hydrocortisone cream to the incision for burning or itching or discomfort.  Velora Heckler, MD, [facility] Office: [phone]

## 2013-06-05 NOTE — Progress Notes (Signed)
General Surgery [facility]  Chief Complaint  Patient presents with  . Thyroid Cancer    long term follow up    HISTORY: Patient presents for evaluation of thyroid. She had undergone a left thyroid lobectomy in 2001. 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 Incidental finding of a 6 mm follicular variant of papillary thyroid carcinoma was made. Surgical margins were negative. She has had intermittent followup.  Patient has done well since sigmoid colectomy for diverticular disease.  PERTINENT REVIEW OF SYSTEMS: Occasional diarrhea. Occasional discomfort at surgical incision anterior neck.  EXAM: HEENT: normocephalic; pupils equal and reactive; sclerae clear; dentition good; mucous membranes moist NECK:  Surgical incision well healed; no palpable mass in the thyroid bed; symmetric on extension; no palpable anterior or posterior cervical lymphadenopathy; no supraclavicular masses; no tenderness CHEST: clear to auscultation bilaterally without rales, rhonchi, or wheezes CARDIAC: regular rate and rhythm without significant murmur; peripheral pulses are full ABDOMEN: Soft without distention; midline incision well healed with no sign of herniation; no palpable masses; no tenderness EXT:  non-tender without edema; no deformity NEURO: no gross focal deficits; no sign of tremor   IMPRESSION: #1 personal history of follicular variant of papillary thyroid carcinoma, no evidence of recurrent disease #2 personal history of sigmoid colectomy for diverticular disease  PLAN: Patient is reassured. We will perform a thyroid ultrasound in one year and compared to her present study. She will return at that time for physical examination. If there is no sign of significant change or recurrent disease at that time, I think she can be released from further surgical follow-up.  Velora Heckler, MD, [facility] Office: [phone]  Visit  Diagnoses: 1. History of thyroid cancer   2. DIVERTICULOSIS OF COLON

## 2013-07-29 ENCOUNTER — Encounter (HOSPITAL_BASED_OUTPATIENT_CLINIC_OR_DEPARTMENT_OTHER): Payer: Self-pay | Admitting: Emergency Medicine

## 2013-07-29 ENCOUNTER — Emergency Department (HOSPITAL_BASED_OUTPATIENT_CLINIC_OR_DEPARTMENT_OTHER): Payer: BC Managed Care – PPO

## 2013-07-29 ENCOUNTER — Emergency Department (HOSPITAL_BASED_OUTPATIENT_CLINIC_OR_DEPARTMENT_OTHER)
Admission: EM | Admit: 2013-07-29 | Discharge: 2013-07-29 | Disposition: A | Discharge status: Home / Self Care | Payer: BC Managed Care – PPO | Attending: Emergency Medicine | Admitting: Emergency Medicine

## 2013-07-29 DIAGNOSIS — IMO0002 Reserved for concepts with insufficient information to code with codable children: Secondary | ICD-10-CM | POA: Insufficient documentation

## 2013-07-29 DIAGNOSIS — K219 Gastro-esophageal reflux disease without esophagitis: Secondary | ICD-10-CM | POA: Insufficient documentation

## 2013-07-29 DIAGNOSIS — J45901 Unspecified asthma with (acute) exacerbation: Secondary | ICD-10-CM | POA: Insufficient documentation

## 2013-07-29 DIAGNOSIS — Z85850 Personal history of malignant neoplasm of thyroid: Secondary | ICD-10-CM | POA: Insufficient documentation

## 2013-07-29 DIAGNOSIS — Z79899 Other long term (current) drug therapy: Secondary | ICD-10-CM | POA: Insufficient documentation

## 2013-07-29 DIAGNOSIS — J3489 Other specified disorders of nose and nasal sinuses: Secondary | ICD-10-CM | POA: Insufficient documentation

## 2013-07-29 DIAGNOSIS — J45909 Unspecified asthma, uncomplicated: Secondary | ICD-10-CM

## 2013-07-29 DIAGNOSIS — R51 Headache: Secondary | ICD-10-CM | POA: Insufficient documentation

## 2013-07-29 DIAGNOSIS — J40 Bronchitis, not specified as acute or chronic: Secondary | ICD-10-CM | POA: Insufficient documentation

## 2013-07-29 DIAGNOSIS — E039 Hypothyroidism, unspecified: Secondary | ICD-10-CM | POA: Insufficient documentation

## 2013-07-29 DIAGNOSIS — Z792 Long term (current) use of antibiotics: Secondary | ICD-10-CM | POA: Insufficient documentation

## 2013-07-29 DIAGNOSIS — H9209 Otalgia, unspecified ear: Secondary | ICD-10-CM | POA: Insufficient documentation

## 2013-07-29 DIAGNOSIS — Z8739 Personal history of other diseases of the musculoskeletal system and connective tissue: Secondary | ICD-10-CM | POA: Insufficient documentation

## 2013-07-29 DIAGNOSIS — M542 Cervicalgia: Secondary | ICD-10-CM | POA: Insufficient documentation

## 2013-07-29 MED ORDER — PREDNISONE 10 MG PO TABS: 40.0000 mg | ORAL_TABLET | Freq: Every day | ORAL | Status: DC

## 2013-07-29 MED ORDER — IPRATROPIUM-ALBUTEROL 0.5-2.5 (3) MG/3ML IN SOLN
3.0000 mL | Freq: Once | RESPIRATORY_TRACT | Status: AC
  Administered 2013-07-29: 3 mL via RESPIRATORY_TRACT
  Filled 2013-07-29: qty 3

## 2013-07-29 MED ORDER — FAMOTIDINE IN NACL 20-0.9 MG/50ML-% IV SOLN: 20.0000 mg | Freq: Once | INTRAVENOUS | Status: DC

## 2013-07-29 MED ORDER — FAMOTIDINE 20 MG PO TABS
20.0000 mg | ORAL_TABLET | Freq: Once | ORAL | Status: AC
  Administered 2013-07-29: 20 mg via ORAL
  Filled 2013-07-29: qty 1

## 2013-07-29 MED ORDER — PREDNISONE 50 MG PO TABS
60.0000 mg | ORAL_TABLET | Freq: Once | ORAL | Status: AC
  Administered 2013-07-29: 60 mg via ORAL
  Filled 2013-07-29 (×2): qty 1

## 2013-07-29 MED ORDER — FAMOTIDINE 20 MG PO TABS: 20.0000 mg | ORAL_TABLET | Freq: Two times a day (BID) | ORAL | Status: DC

## 2013-07-29 MED ORDER — IPRATROPIUM BROMIDE HFA 17 MCG/ACT IN AERS: 2.0000 | INHALATION_SPRAY | Freq: Once | RESPIRATORY_TRACT | Status: DC

## 2013-07-29 MED ORDER — ALBUTEROL SULFATE (2.5 MG/3ML) 0.083% IN NEBU: 2.5000 mg | INHALATION_SOLUTION | Freq: Once | RESPIRATORY_TRACT | Status: DC

## 2013-07-29 NOTE — ED Notes (Signed)
Pt was seen on 1/16 for cough at PCP and received a steroid shot and dx with bronchitis per xray. Was seen again on 1/20 and was given tussionex and albuterol. Pt feels as though she is not improving. Still having productive cough and congestion, headaches

## 2013-07-29 NOTE — Discharge Instructions (Signed)
Asthma, Acute Bronchospasm °Acute bronchospasm caused by asthma is also referred to as an asthma attack. Bronchospasm means your air passages become narrowed. The narrowing is caused by inflammation and tightening of the muscles in the air tubes (bronchi) in your lungs. This can make it hard to breath or cause you to wheeze and cough. °CAUSES °Possible triggers are: °· Animal dander from the skin, hair, or feathers of animals. °· Dust mites contained in house dust. °· Cockroaches. °· Pollen from trees or grass. °· Mold. °· Cigarette or tobacco smoke. °· Air pollutants such as dust, household cleaners, hair sprays, aerosol sprays, paint fumes, strong chemicals, or strong odors. °· Cold air or weather changes. Cold air may trigger inflammation. Winds increase molds and pollens in the air. °· Strong emotions such as crying or laughing hard. °· Stress. °· Certain medicines such as aspirin or beta-blockers. °· Sulfites in foods and drinks, such as dried fruits and wine. °· Infections or inflammatory conditions, such as a flu, cold, or inflammation of the nasal membranes (rhinitis). °· Gastroesophageal reflux disease (GERD). GERD is a condition where stomach acid backs up into your throat (esophagus). °· Exercise or strenuous activity. °SIGNS AND SYMPTOMS  °· Wheezing. °· Excessive coughing, particularly at night. °· Chest tightness. °· Shortness of breath. °DIAGNOSIS  °Your health care provider will ask you about your medical history and perform a physical exam. A chest X-ray or blood testing may be performed to look for other causes of your symptoms or other conditions that may have triggered your asthma attack.  °TREATMENT  °Treatment is aimed at reducing inflammation and opening up the airways in your lungs.  Most asthma attacks are treated with inhaled medicines. These include quick relief or rescue medicines (such as bronchodilators) and controller medicines (such as inhaled corticosteroids). These medicines are  sometimes given through an inhaler or a nebulizer. Systemic steroid medicine taken by mouth or given through an IV tube also can be used to reduce the inflammation when an attack is moderate or severe. Antibiotic medicines are only used if a bacterial infection is present.  °HOME CARE INSTRUCTIONS  °· Rest. °· Drink plenty of liquids. This helps the mucus to remain thin and be easily coughed up. Only use caffeine in moderation and do not use alcohol until you have recovered from your illness. °· Do not smoke. Avoid being exposed to secondhand smoke. °· You play a critical role in keeping yourself in good health. Avoid exposure to things that cause you to wheeze or to have breathing problems. °· Keep your medicines up to date and available. Carefully follow your health care provider's treatment plan. °· Take your medicine exactly as prescribed. °· When pollen or pollution is bad, keep windows closed and use an air conditioner or go to places with air conditioning. °· Asthma requires careful medical care. See your health care provider for a follow-up as advised. If you are more than [redacted] weeks pregnant and you were prescribed any new medicines, let your obstetrician know about the visit and how you are doing. Follow-up with your health care provider as directed. °· After you have recovered from your asthma attack, make an appointment with your outpatient doctor to talk about ways to reduce the likelihood of future attacks. If you do not have a doctor who manages your asthma, make an appointment with a primary care doctor to discuss your asthma. °SEEK IMMEDIATE MEDICAL CARE IF:  °· You are getting worse. °· You have trouble breathing. If severe, call   your local emergency services (911 in the [place]  You develop chest pain or discomfort.  You are vomiting.  You are not able to keep fluids down.  You are coughing up yellow, green, brown, or bloody sputum.  You have a fever and your symptoms suddenly get  worse.  You have trouble swallowing. MAKE SURE YOU:   Understand these instructions.  Will watch your condition.  Will get help right away if you are not doing well or get worse. Document Released: 10/06/2006 Document Revised: 02/21/2013 Document Reviewed: 12/27/2012 [facility] Patient Information 2014 [place], [place].   As we discussed continue the antibiotic. Continue albuterol inhaler 2 puffs every 6 hours as directed. Five-day course of prednisone to help with the inflammation. Also have Pepcid to take along with prednisone to help keep your stomach,. Followup with your doctor as needed. Return for any newer worse symptoms. Work note provided.

## 2013-07-29 NOTE — ED Provider Notes (Signed)
CSN: [id number]     Arrival date & time 07/29/13  1551 History  This chart was scribed for Mervin Kung, MD by Maree Erie, ED Scribe. The patient was seen in room MH05/MH05. Patient's care was started at 4:18 PM.    Chief Complaint  Patient presents with  . URI    Patient is a 63 y.o. female presenting with URI. The history is provided by the patient. No language interpreter was used.  URI Presenting symptoms: congestion, cough, ear pain and rhinorrhea   Presenting symptoms: no fever   Onset quality:  Gradual Duration:  1 month Timing:  Constant Progression:  Unchanged Chronicity:  New Relieved by:  Nothing Ineffective treatments:  Decongestant, prescription medications and OTC medications Associated symptoms: headaches and neck pain   Risk factors: sick contacts     HPI Comments: Linda Wang is a 63 y.o. female with a history of hypothyroidism, hyperlipidemia, tubular adenoma of colo, diverticulitis, GERD, athritis, TMJ, hemorrhoids, DDD, thyroid cancer and segmental colitis who presents to the Emergency Department complaining of constant, unchanged cough with associated congestion, rhinorrhea and ear pain that began about a month ago, around 12/20. She also reports neck pain, headache and intermittent shortness of breath with coughing spells. She was seen by her PCP on 1/16 for the cough and was given a steroid injection, Tussonex, Albuterol inhaler and Tamiflu for diagnosis of URI. She was seen on 1/20 for worsening symptoms and had an x-ray done and was then diagnosed with bronchitis. She has been taking the prescribed medications from 1/16 as well as Mucinex DM and Avalox (starting 1/20) without relief. She denies a diagnosis of asthma prior to being seen for this cough. 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 She denies fever, chills, visual changes, chest pain, abdominal pain, nausea, vomiting, diarrhea, dysuria, ankle swelling, rash, history of bleeding easily,  or sinus pressure.  PCP is Dr. Orland Penman at [facility].  Past Medical History  Diagnosis Date  . Hypothyroidism   . Hyperlipidemia   . Tubular adenoma of colon 01/20/2000  . Diverticulosis of colon (without mention of hemorrhage)   . Asthma     MILD- NO INHALERS  . GERD (gastroesophageal reflux disease)   . Arthritis   . Dizziness     EVALUATED WITH MANY TESTS INCLUDING CT SCAN HEAD--NO PROBLEMS FOUND--PT STILL HAS OCCAS BOUT OF DIZZINESS.  . TMJ (temporomandibular joint syndrome)     BILATERAL  . Hemorrhoids   . Diverticulitis   . DDD (degenerative disc disease), lumbar   . Thyroid cancer   . Segmental colitis    Past Surgical History  Procedure Laterality Date  . Abdominal hysterectomy    . Tubal ligation    . Thyroidectomy, partial    . Hemorroidectomy    . Appendectomy    . Cesarean section    . Partial colectomy N/A 10/12/2012    Procedure: SIGMOID COLECTOMY;  Surgeon: Earnstine Regal, MD;  Location: [facility];  Service: General;  Laterality: N/A;  . Rectocele repair     Family History  Problem Relation Age of Onset  . Thyroid cancer Mother   . Colon cancer Father   . Bladder Cancer Father   . Prostate cancer Brother   . Prostate cancer Brother   . Colonic polyp Brother   . Colon polyps Brother   . Diabetes Mother   . Heart disease Mother    History  Substance Use Topics  . Smoking status: Never Smoker   .  Smokeless tobacco: Never Used  . Alcohol Use: Yes     Comment: occassionally   OB History   Grav Para Term Preterm Abortions TAB SAB Ect Mult Living                 Review of Systems  Constitutional: Negative for fever and chills.  HENT: Positive for congestion, ear pain and rhinorrhea. Negative for sinus pressure.   Eyes: Negative for visual disturbance.  Respiratory: Positive for cough and shortness of breath.   Cardiovascular: Negative for chest pain and leg swelling.  Gastrointestinal: Negative for nausea, vomiting, abdominal pain and diarrhea.   Genitourinary: Negative for dysuria.  Musculoskeletal: Positive for neck pain.  Skin: Negative for rash.  Neurological: Positive for headaches.  Hematological: Does not bruise/bleed easily.  Psychiatric/Behavioral: Negative for confusion.    Allergies  Statins and Sulfonamide derivatives  Home Medications   Current Outpatient Rx  Name  Route  Sig  Dispense  Refill  . ALPRAZolam (XANAX) 0.5 MG tablet   Oral   Take 0.5 mg by mouth 3 (three) times daily as needed for anxiety.         . Calcium-Vitamin D-Vitamin K (CALCIUM + D + K PO)   Oral   Take 1 capsule by mouth daily.         . ciprofloxacin (CIPRO) 500 MG tablet   Oral   Take 1 tablet (500 mg total) by mouth 2 (two) times daily.   20 tablet   0   . estradiol (MINIVELLE) 0.075 MG/24HR   Transdermal   Place 1 patch onto the skin 2 (two) times a week.         . famotidine (PEPCID) 20 MG tablet   Oral   Take 1 tablet (20 mg total) by mouth 2 (two) times daily.   10 tablet   0   . hydrocortisone (ANUSOL-HC) 25 MG suppository   Rectal   Place 1 suppository (25 mg total) rectally 2 (two) times daily.   12 suppository   1   . levothyroxine (SYNTHROID, LEVOTHROID) 88 MCG tablet   Oral   Take 1 tablet (88 mcg total) by mouth daily.   90 tablet   3   . predniSONE (DELTASONE) 10 MG tablet   Oral   Take 4 tablets (40 mg total) by mouth daily.   20 tablet   0    Triage Vitals: BP 170/91  Pulse 111  Temp(Src) 99.6 F (37.6 C) (Oral)  Resp 18  Ht 5\' 3"  (1.6 m)  Wt 154 lb (69.854 kg)  BMI 27.29 kg/m2  SpO2 95%  Physical Exam  Nursing note and vitals reviewed. Constitutional: She is oriented to person, place, and time. She appears well-developed and well-nourished.  HENT:  Right Ear: Tympanic membrane normal.  Left Ear: Tympanic membrane normal.  Mouth/Throat: Mucous membranes are normal.  White coating on tongue.   Cardiovascular: Normal rate and regular rhythm.   Pulmonary/Chest: Effort normal.  She has wheezes.  Bilateral wheezing, right more than left.   Abdominal: Bowel sounds are normal. There is no tenderness.  Neurological: She is alert and oriented to person, place, and time. No cranial nerve deficit.  Skin: Skin is warm and dry.  Psychiatric: She has a normal mood and affect.    ED Course  Procedures (including critical care time)  DIAGNOSTIC STUDIES: Oxygen Saturation is 95% on room air, normal by my interpretation.    COORDINATION OF CARE: 4:25 PM -Will order Albuterol and  Atrovent as well as Prednisone. Patient verbalizes understanding and agrees with treatment plan.  Dg Chest 2 View  07/29/2013   CLINICAL DATA:  Cough and chest congestion.  Asthma.  EXAM: CHEST  2 VIEW  COMPARISON:  07/24/2013  FINDINGS: The heart size and mediastinal contours are within normal limits. Both lungs are clear. The visualized skeletal structures are unremarkable.  IMPRESSION: Normal chest.   Electronically Signed   By: Rozetta Nunnery M.D.   On: 07/29/2013 18:14    EKG Interpretation   None       MDM   1. Bronchitis   2. Reactive airway disease    Patient symptoms consistent with persistent upper respiratory infection and now bronchitis. Probably reactive airway disease with wheezing. Albuterol Atrovent nebulizers x2 along with prednisone some significant improvement in the wheezing but not completely resolved. Patient will continue on a course of prednisone. Patient will continue the medication started by her primary care Dr. Work note provided to be out of work for the next few days followup with primary care Dr. if not improved or if worse. Patient has albuterol inhaler to use at home.  I personally performed the services described in this documentation, which was scribed in my presence. The recorded information has been reviewed and is accurate.     Mervin Kung, MD 07/29/13 [phone]

## 2013-11-01 ENCOUNTER — Telehealth (INDEPENDENT_AMBULATORY_CARE_PROVIDER_SITE_OTHER): Payer: Self-pay

## 2013-11-01 NOTE — Telephone Encounter (Signed)
Pt called stating she starting with rectal bleeding last night and has hade 5 bms with blood with each bm. Pt offered appt today but has not transportation until tomorrow. Pt advised if she has weakness,sob,dizziness or bleeding increases she should go to ER. Pt states she understands.

## 2013-11-02 ENCOUNTER — Encounter (INDEPENDENT_AMBULATORY_CARE_PROVIDER_SITE_OTHER): Payer: Self-pay | Admitting: General Surgery

## 2013-11-02 ENCOUNTER — Ambulatory Visit (INDEPENDENT_AMBULATORY_CARE_PROVIDER_SITE_OTHER): Payer: BC Managed Care – PPO | Admitting: General Surgery

## 2013-11-02 VITALS — BP 132/80 | HR 78 | Temp 97.4°F | Resp 16 | Ht 62.5 in | Wt 155.0 lb

## 2013-11-02 DIAGNOSIS — K6289 Other specified diseases of anus and rectum: Secondary | ICD-10-CM

## 2013-11-02 MED ORDER — HYDROCORTISONE ACETATE 25 MG RE SUPP: 25.0000 mg | Freq: Two times a day (BID) | RECTAL | Status: DC

## 2013-11-02 NOTE — Patient Instructions (Signed)
Fiber Chart  You should 25-30g of fiber per day and drinking 8 glasses of water to help your bowels move regularly.  In the chart below you can look up how much fiber you are getting in an average day.  If you are not getting enough fiber, you should add a fiber supplement to your diet.  Examples of this include Metamucil, FiberCon and Citrucel.  These can be purchased at your local grocery store or pharmacy.      http://reyes-guerrero.[email or web address] Murphy. Irregular bowel habits such as diarrhea can lead to many problems over time.  Having one soft, formed bowel movement a day is the most important way to prevent further problems.  The anorectal canal is designed to handle stretching and feces to safely manage our ability to get rid of solid waste (feces, poop, stool) out of our body.  BUT, diarrhea can be a burning fire to this very sensitive area of our body, causing inflamed hemorrhoids, anal fissures, increasing risk is perirectal abscesses, abdominal pain and bloating.     The goal: ONE SOFT BOWEL MOVEMENT A DAY!  To have soft, regular bowel movements:    Drink at least 8 tall glasses of water a day.     Take plenty of fiber.  Fiber is the undigested part of plant food that passes into the colon, acting s "natures broom" to encourage bowel motility and movement.  Fiber can absorb and hold large amounts of water. This results in a larger, bulkier stool, which is soft and easier to pass. Work gradually over several weeks up to 6 servings a day of fiber (25g a day even more if needed) in the form of: o Vegetables -- Root (potatoes, carrots, turnips), leafy green (lettuce, salad greens, celery, spinach), or cooked high residue (cabbage, broccoli, etc) o Fruit -- Fresh (unpeeled skin & pulp), Dried (prunes, apricots, cherries, etc ),  or stewed ( applesauce)  o Whole grain breads, pasta, etc (whole wheat)  o Bran cereals    Bulking Agents  -- This type of water-retaining fiber generally is easily obtained each day by one of the following:   o Methylcellulose -- This is another fiber derived from wood which also retains water. It is available as Citrucel.   Controlling diarrhea o Switch to liquids and simpler foods for a few days to avoid stressing your intestines further. o Avoid dairy products (especially milk & ice cream) for a short time.  The intestines often can lose the ability to digest lactose when stressed. o Avoid foods that cause gassiness or bloating.  Typical foods include beans and other legumes, cabbage, broccoli, and dairy foods.  Every person has some sensitivity to other foods, so listen to our body and avoid those foods that trigger problems for you. o Adding fiber gradually can help thicken stools by absorbing excess fluid and retrain the intestines to act more normally.  Slowly increase the dose over a few weeks.  Too much fiber too soon can backfire and cause cramping & bloating. o Probiotics (such as active yogurt, Align, etc) may help repopulate the intestines and colon with normal bacteria and calm down a sensitive digestive tract.  Most studies show it to be of mild help, though, and such products can be costly. o Medicines:   Bismuth subsalicylate (ex. Kayopectate, Pepto Bismol) every 30 minutes for up to 6 doses can help control diarrhea.  Avoid if pregnant.   Loperamide (Immodium) can slow down diarrhea.  Start  with two tablets (4mg  total) first and then try one tablet every 6 hours.  Avoid if you are having fevers or severe pain.  If you are not better or start feeling worse, stop all medicines and call your doctor for advice o Call your doctor if you are getting worse or not better.  Sometimes further testing (cultures, endoscopy, X-ray studies, bloodwork, etc) may be needed to help diagnose and treat the cause of the diarrhea. o

## 2013-11-02 NOTE — Progress Notes (Signed)
Chief Complaint  Patient presents with  . Follow-up    bleeding hems    HISTORY: Linda Wang is a 63 y.o. female who presents to the office with rectal bleeding.  Other symptoms include anal pain.  This had been occurring for 2 days.  She has tried sitz baths, Rx suppositories in the past with little success.  Nothing makes the symptoms worse.   It is intermittent in nature.  Her bowel habits are regular and her bowel movements are usually soft.  Her fiber intake is dietary.  Her last colonoscopy was April 2014.  She has a h/o sigmoidectomy and RBL in the past.       Past Medical History  Diagnosis Date  . Hypothyroidism   . Hyperlipidemia   . Tubular adenoma of colon 01/20/2000  . Diverticulosis of colon (without mention of hemorrhage)   . Asthma     MILD- NO INHALERS  . GERD (gastroesophageal reflux disease)   . Arthritis   . Dizziness     EVALUATED WITH MANY TESTS INCLUDING CT SCAN HEAD--NO PROBLEMS FOUND--PT STILL HAS OCCAS BOUT OF DIZZINESS.  . TMJ (temporomandibular joint syndrome)     BILATERAL  . Hemorrhoids   . Diverticulitis   . DDD (degenerative disc disease), lumbar   . Thyroid cancer   . Segmental colitis       Past Surgical History  Procedure Laterality Date  . Abdominal hysterectomy    . Tubal ligation    . Thyroidectomy, partial    . Hemorroidectomy    . Appendectomy    . Cesarean section    . Partial colectomy N/A 10/12/2012    Procedure: SIGMOID COLECTOMY;  Surgeon: Earnstine Regal, MD;  Location: [facility];  Service: General;  Laterality: N/A;  . Rectocele repair          Current Outpatient Prescriptions  Medication Sig Dispense Refill  . ALPRAZolam (XANAX) 0.5 MG tablet Take 0.5 mg by mouth 3 (three) times daily as needed for anxiety.      . Calcium-Vitamin D-Vitamin K (CALCIUM + D + K PO) Take 1 capsule by mouth daily.      Marland Kitchen estradiol (MINIVELLE) 0.075 MG/24HR Place 1 patch onto the skin 2 (two) times a week.      . hydrocortisone (ANUSOL-HC) 25 MG  suppository Place 1 suppository (25 mg total) rectally 2 (two) times daily.  12 suppository  1  . levothyroxine (SYNTHROID, LEVOTHROID) 88 MCG tablet Take 1 tablet (88 mcg total) by mouth daily.  90 tablet  3   No current facility-administered medications for this visit.      Allergies  Allergen Reactions  . Statins     Give pt GERD  . Sulfonamide Derivatives Hives      Family History  Problem Relation Age of Onset  . Thyroid cancer Mother   . Colon cancer Father   . Bladder Cancer Father   . Prostate cancer Brother   . Prostate cancer Brother   . Colonic polyp Brother   . Colon polyps Brother   . Diabetes Mother   . Heart disease Mother     History   Social History  . Marital Status: Single    Spouse Name: N/A    Number of Children: 1  . 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 Smoking status: Never Smoker   . Smokeless tobacco: Never Used  .  Alcohol Use: Yes     Comment: occassionally  . Drug Use: No  . Sexual Activity: None   Other Topics Concern  . None   Social History Narrative  . None      REVIEW OF SYSTEMS - PERTINENT POSITIVES ONLY: Review of Systems - General ROS: negative for - chills, fever or weight loss Hematological and Lymphatic ROS: negative for - bleeding problems, blood clots or bruising Respiratory ROS: no cough, shortness of breath, or wheezing Cardiovascular ROS: no chest pain or dyspnea on exertion Gastrointestinal ROS: no abdominal pain, change in bowel habits, or black or bloody stools Genito-Urinary ROS: no dysuria, trouble voiding, or hematuria  EXAM: Filed Vitals:   11/02/13 1533  BP: 132/80  Pulse: 78  Temp: 97.4 F (36.3 C)  Resp: 16    General appearance: alert and cooperative Resp: clear to auscultation bilaterally Cardio: regular rate and rhythm GI: normal findings: soft, non-tender  DRE Findings: no active bleeding, edematous  internal hemorrhoids noted    ASSESSMENT AND PLAN: Linda Wang is a 63 y.o. Female with rectal bleeding. I suggested that she continue to use her stool softeners. I have refilled her Anusol suppository.  She will use these for the next few weeks. I've suggested that she evaluate her diet to make sure she is getting 25 g of fiber a day.  I will see her back in 3-4 weeks once things have calmed down and plan on performing anoscopy at that time to decide if any further treatment is necessary.    Rosario Adie, MD Colon and Rectal Surgery / [facility]      Visit Diagnoses: 1. Anal or rectal pain     Primary Care Physician: Gavin Pound, MD

## 2013-11-27 ENCOUNTER — Encounter (INDEPENDENT_AMBULATORY_CARE_PROVIDER_SITE_OTHER): Payer: BC Managed Care – PPO | Admitting: General Surgery

## 2013-12-07 ENCOUNTER — Encounter (INDEPENDENT_AMBULATORY_CARE_PROVIDER_SITE_OTHER): Payer: BC Managed Care – PPO | Admitting: General Surgery

## 2014-04-02 ENCOUNTER — Other Ambulatory Visit: Payer: Self-pay | Admitting: Obstetrics & Gynecology

## 2014-04-03 LAB — CYTOLOGY - PAP

## 2014-04-09 ENCOUNTER — Other Ambulatory Visit: Payer: Self-pay | Admitting: Obstetrics & Gynecology

## 2014-04-09 DIAGNOSIS — R928 Other abnormal and inconclusive findings on diagnostic imaging of breast: Secondary | ICD-10-CM

## 2014-04-15 ENCOUNTER — Ambulatory Visit
Admission: RE | Admit: 2014-04-15 | Discharge: 2014-04-15 | Disposition: A | Discharge status: Home / Self Care | Payer: BC Managed Care – PPO | Source: Ambulatory Visit | Attending: Obstetrics & Gynecology | Admitting: Obstetrics & Gynecology

## 2014-04-15 ENCOUNTER — Encounter (INDEPENDENT_AMBULATORY_CARE_PROVIDER_SITE_OTHER): Payer: Self-pay

## 2014-04-15 DIAGNOSIS — R928 Other abnormal and inconclusive findings on diagnostic imaging of breast: Secondary | ICD-10-CM

## 2014-09-12 ENCOUNTER — Other Ambulatory Visit: Payer: Self-pay | Admitting: Obstetrics & Gynecology

## 2014-09-12 DIAGNOSIS — N6002 Solitary cyst of left breast: Secondary | ICD-10-CM

## 2014-09-13 ENCOUNTER — Other Ambulatory Visit (INDEPENDENT_AMBULATORY_CARE_PROVIDER_SITE_OTHER): Payer: Self-pay

## 2014-09-13 DIAGNOSIS — E041 Nontoxic single thyroid nodule: Secondary | ICD-10-CM

## 2014-09-15 NOTE — Addendum Note (Signed)
Addended by: Earnstine Regal on: 09/15/2014 11:02 AM   Modules accepted: Orders

## 2014-09-16 ENCOUNTER — Other Ambulatory Visit: Payer: Self-pay | Admitting: Surgery

## 2014-09-16 ENCOUNTER — Other Ambulatory Visit (INDEPENDENT_AMBULATORY_CARE_PROVIDER_SITE_OTHER): Payer: Self-pay

## 2014-09-16 DIAGNOSIS — E041 Nontoxic single thyroid nodule: Secondary | ICD-10-CM

## 2014-10-14 ENCOUNTER — Other Ambulatory Visit: Payer: Self-pay | Admitting: Obstetrics and Gynecology

## 2014-10-14 DIAGNOSIS — N6002 Solitary cyst of left breast: Secondary | ICD-10-CM

## 2014-10-17 ENCOUNTER — Ambulatory Visit
Admission: RE | Admit: 2014-10-17 | Discharge: 2014-10-17 | Disposition: A | Discharge status: Home / Self Care | Payer: No Typology Code available for payment source | Source: Ambulatory Visit | Attending: Obstetrics & Gynecology | Admitting: Obstetrics & Gynecology

## 2014-10-17 ENCOUNTER — Ambulatory Visit
Admission: RE | Admit: 2014-10-17 | Discharge: 2014-10-17 | Disposition: A | Discharge status: Home / Self Care | Payer: No Typology Code available for payment source | Source: Ambulatory Visit | Attending: Surgery | Admitting: Surgery

## 2014-10-17 DIAGNOSIS — N6002 Solitary cyst of left breast: Secondary | ICD-10-CM

## 2014-10-31 ENCOUNTER — Other Ambulatory Visit: Payer: Self-pay | Admitting: Surgery

## 2014-11-25 ENCOUNTER — Encounter: Payer: Self-pay | Admitting: Gastroenterology

## 2015-03-18 ENCOUNTER — Other Ambulatory Visit: Payer: Self-pay | Admitting: Obstetrics & Gynecology

## 2015-03-18 DIAGNOSIS — IMO0002 Reserved for concepts with insufficient information to code with codable children: Secondary | ICD-10-CM

## 2015-03-18 DIAGNOSIS — R229 Localized swelling, mass and lump, unspecified: Principal | ICD-10-CM

## 2015-03-25 ENCOUNTER — Encounter: Payer: Self-pay | Admitting: Internal Medicine

## 2015-03-25 ENCOUNTER — Encounter: Payer: Self-pay | Admitting: Gastroenterology

## 2015-04-14 ENCOUNTER — Ambulatory Visit
Admission: RE | Admit: 2015-04-14 | Discharge: 2015-04-14 | Disposition: A | Discharge status: Home / Self Care | Payer: No Typology Code available for payment source | Source: Ambulatory Visit | Attending: Obstetrics & Gynecology | Admitting: Obstetrics & Gynecology

## 2015-04-14 DIAGNOSIS — IMO0002 Reserved for concepts with insufficient information to code with codable children: Secondary | ICD-10-CM

## 2015-04-14 DIAGNOSIS — R229 Localized swelling, mass and lump, unspecified: Principal | ICD-10-CM

## 2015-05-02 ENCOUNTER — Ambulatory Visit: Payer: Self-pay | Admitting: Surgery

## 2015-05-11 ENCOUNTER — Encounter (HOSPITAL_BASED_OUTPATIENT_CLINIC_OR_DEPARTMENT_OTHER): Payer: Self-pay | Admitting: Emergency Medicine

## 2015-05-11 ENCOUNTER — Emergency Department (HOSPITAL_BASED_OUTPATIENT_CLINIC_OR_DEPARTMENT_OTHER)
Admission: EM | Admit: 2015-05-11 | Discharge: 2015-05-11 | Disposition: A | Discharge status: Home / Self Care | Payer: No Typology Code available for payment source | Attending: Emergency Medicine | Admitting: Emergency Medicine

## 2015-05-11 DIAGNOSIS — Z79899 Other long term (current) drug therapy: Secondary | ICD-10-CM | POA: Insufficient documentation

## 2015-05-11 DIAGNOSIS — R110 Nausea: Secondary | ICD-10-CM | POA: Diagnosis not present

## 2015-05-11 DIAGNOSIS — Z8739 Personal history of other diseases of the musculoskeletal system and connective tissue: Secondary | ICD-10-CM | POA: Insufficient documentation

## 2015-05-11 DIAGNOSIS — Z862 Personal history of diseases of the blood and blood-forming organs and certain disorders involving the immune mechanism: Secondary | ICD-10-CM | POA: Diagnosis not present

## 2015-05-11 DIAGNOSIS — E039 Hypothyroidism, unspecified: Secondary | ICD-10-CM | POA: Insufficient documentation

## 2015-05-11 DIAGNOSIS — R1084 Generalized abdominal pain: Secondary | ICD-10-CM | POA: Diagnosis not present

## 2015-05-11 DIAGNOSIS — Z8719 Personal history of other diseases of the digestive system: Secondary | ICD-10-CM | POA: Insufficient documentation

## 2015-05-11 DIAGNOSIS — Z86018 Personal history of other benign neoplasm: Secondary | ICD-10-CM | POA: Insufficient documentation

## 2015-05-11 DIAGNOSIS — Z85850 Personal history of malignant neoplasm of thyroid: Secondary | ICD-10-CM | POA: Insufficient documentation

## 2015-05-11 DIAGNOSIS — J45909 Unspecified asthma, uncomplicated: Secondary | ICD-10-CM | POA: Insufficient documentation

## 2015-05-11 DIAGNOSIS — Z792 Long term (current) use of antibiotics: Secondary | ICD-10-CM | POA: Insufficient documentation

## 2015-05-11 DIAGNOSIS — R109 Unspecified abdominal pain: Secondary | ICD-10-CM | POA: Diagnosis present

## 2015-05-11 LAB — CBC WITH DIFFERENTIAL/PLATELET
BASOS ABS: 0 10*3/uL (ref 0.0–0.1)
BASOS PCT: 0 %
Eosinophils Absolute: 0.1 10*3/uL (ref 0.0–0.7)
Eosinophils Relative: 1 %
HEMATOCRIT: 42.4 % (ref 36.0–46.0)
HEMOGLOBIN: 14 g/dL (ref 12.0–15.0)
Lymphocytes Relative: 21 %
Lymphs Abs: 2.1 10*3/uL (ref 0.7–4.0)
MCH: 29.7 pg (ref 26.0–34.0)
MCHC: 33 g/dL (ref 30.0–36.0)
MCV: 89.8 fL (ref 78.0–100.0)
Monocytes Absolute: 0.5 10*3/uL (ref 0.1–1.0)
Monocytes Relative: 5 %
NEUTROS ABS: 7.3 10*3/uL (ref 1.7–7.7)
Neutrophils Relative %: 73 %
Platelets: 301 10*3/uL (ref 150–400)
RBC: 4.72 MIL/uL (ref 3.87–5.11)
RDW: 12.4 % (ref 11.5–15.5)
WBC: 10.1 10*3/uL (ref 4.0–10.5)

## 2015-05-11 LAB — COMPREHENSIVE METABOLIC PANEL
ALBUMIN: 4.4 g/dL (ref 3.5–5.0)
ALT: 19 U/L (ref 14–54)
ANION GAP: 7 (ref 5–15)
AST: 21 U/L (ref 15–41)
Alkaline Phosphatase: 31 U/L — ABNORMAL LOW (ref 38–126)
BILIRUBIN TOTAL: 0.6 mg/dL (ref 0.3–1.2)
BUN: 14 mg/dL (ref 6–20)
CHLORIDE: 103 mmol/L (ref 101–111)
CO2: 25 mmol/L (ref 22–32)
Calcium: 9.2 mg/dL (ref 8.9–10.3)
Creatinine, Ser: 0.72 mg/dL (ref 0.44–1.00)
GFR calc Af Amer: >60 mL/min (ref >60)
GFR calc non Af Amer: >60 mL/min (ref >60)
GLUCOSE: 93 mg/dL (ref 65–99)
POTASSIUM: 3.6 mmol/L (ref 3.5–5.1)
SODIUM: 135 mmol/L (ref 135–145)
TOTAL PROTEIN: 8.3 g/dL — AB (ref 6.5–8.1)

## 2015-05-11 LAB — URINALYSIS, ROUTINE W REFLEX MICROSCOPIC
BILIRUBIN URINE: NEGATIVE
Glucose, UA: NEGATIVE mg/dL
Hgb urine dipstick: NEGATIVE
KETONES UR: NEGATIVE mg/dL
LEUKOCYTES UA: NEGATIVE
NITRITE: NEGATIVE
PH: 6 (ref 5.0–8.0)
PROTEIN: NEGATIVE mg/dL
Specific Gravity, Urine: 1.003 — ABNORMAL LOW (ref 1.005–1.030)
UROBILINOGEN UA: 0.2 mg/dL (ref 0.0–1.0)

## 2015-05-11 LAB — OCCULT BLOOD X 1 CARD TO LAB, STOOL: Fecal Occult Bld: NEGATIVE

## 2015-05-11 LAB — LIPASE, BLOOD: Lipase: 48 U/L (ref 11–51)

## 2015-05-11 MED ORDER — SODIUM CHLORIDE 0.9 % IV BOLUS (SEPSIS)
500.0000 mL | Freq: Once | INTRAVENOUS | Status: AC
  Administered 2015-05-11: 500 mL via INTRAVENOUS

## 2015-05-11 MED ORDER — ONDANSETRON HCL 4 MG/2ML IJ SOLN
4.0000 mg | Freq: Once | INTRAMUSCULAR | Status: AC
  Administered 2015-05-11: 4 mg via INTRAVENOUS
  Filled 2015-05-11: qty 2

## 2015-05-11 MED ORDER — MORPHINE SULFATE (PF) 4 MG/ML IV SOLN
4.0000 mg | Freq: Once | INTRAVENOUS | Status: DC
  Filled 2015-05-11: qty 1

## 2015-05-11 MED ORDER — HYDROMORPHONE HCL 1 MG/ML IJ SOLN
0.5000 mg | Freq: Once | INTRAMUSCULAR | Status: AC
  Administered 2015-05-11: 0.5 mg via INTRAVENOUS
  Filled 2015-05-11: qty 1

## 2015-05-11 MED ORDER — HYOSCYAMINE SULFATE 0.125 MG SL SUBL: 0.1250 mg | SUBLINGUAL_TABLET | SUBLINGUAL | Status: DC | PRN

## 2015-05-11 NOTE — Discharge Instructions (Signed)

## 2015-05-11 NOTE — ED Provider Notes (Signed)
CSN: [id number]     Arrival date & time 05/11/15  1503 History   By signing my name below, I, Terrance Branch, attest that this documentation has been prepared under the direction and in the presence of Charlesetta Shanks, MD. Electronically Signed: Randa Evens, ED Scribe. 05/11/2015. 4:50 PM.      Chief Complaint  Patient presents with  . Abdominal Pain   The history is provided by the patient. No language interpreter was used.   HPI Comments: Linda Wang is a 64 y.o. female with PMHx listed below who presents to the Emergency Department complaining of chronicgeneralized abdominal pain that has recently worsened. Pt states she has hx of hemorrhoids and states that she is having rectal pain as well. She reports nausea. Pt reports that when urinating she is having pelvic pressure. She states that her hemorrhoid pain is worse when sitting down or when having a BM. Pt reports HX of diverticulitis and partial colectomy. Denies fever, vomiting. Pt states that she has an appointment to have her hemorrhoids removed. Patient had no vomiting. She reports occasional nausea. She reports she has normal bowel movements. She reports she is very careful not to become constipated due to her hemorrhoids. She states she eats a meal and takes fiber supplements which often make her stool way too loose and give her a raw anal area.  Past Medical History  Diagnosis Date  . Hypothyroidism   . Hyperlipidemia   . Tubular adenoma of colon 01/20/2000  . Diverticulosis of colon (without mention of hemorrhage)   . Asthma     MILD- NO INHALERS  . GERD (gastroesophageal reflux disease)   . Arthritis   . Dizziness     EVALUATED WITH MANY TESTS INCLUDING CT SCAN HEAD--NO PROBLEMS FOUND--PT STILL HAS OCCAS BOUT OF DIZZINESS.  . TMJ (temporomandibular joint syndrome)     BILATERAL  . Hemorrhoids   . Diverticulitis   . DDD (degenerative disc disease), lumbar   . Thyroid cancer ([place])   . 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 Abdominal hysterectomy    . Tubal ligation    . Thyroidectomy, partial    . Hemorroidectomy    . Appendectomy    . Cesarean section    . Partial colectomy N/A 10/12/2012    Procedure: SIGMOID COLECTOMY;  Surgeon: Earnstine Regal, MD;  Location: [facility];  Service: General;  Laterality: N/A;  . Rectocele repair     Family History  Problem Relation Age of Onset  . Thyroid cancer Mother   . Colon cancer Father   . Bladder Cancer Father   . Prostate cancer Brother   . Prostate cancer Brother   . Colonic polyp Brother   . Colon polyps Brother   . Diabetes Mother   . Heart disease Mother    Social History  Substance Use Topics  . Smoking status: Never Smoker   . Smokeless tobacco: Never Used  . Alcohol Use: Yes     Comment: occassionally   OB History    No data available     Review of Systems 10 Systems reviewed and all are negative for acute change except as noted in the HPI.    Allergies  Statins and Sulfonamide derivatives  Home Medications   Prior to Admission medications   Medication Sig Start Date End Date Taking? Authorizing Provider  ALPRAZolam Duanne Moron) 0.5 MG tablet Take 0.5 mg by mouth 3 (three) times daily as  needed for anxiety.   Yes Historical Provider, MD  Calcium-Vitamin D-Vitamin K (CALCIUM + D + K PO) Take 1 capsule by mouth daily.   Yes Historical Provider, MD  cycloSPORINE (RESTASIS) 0.05 % ophthalmic emulsion 1 drop 2 (two) times daily.   Yes Historical Provider, MD  fenofibrate (TRICOR) 145 MG tablet Take 145 mg by mouth daily.   Yes Historical Provider, MD  levothyroxine (SYNTHROID, LEVOTHROID) 88 MCG tablet Take 1 tablet (88 mcg total) by mouth daily. 09/20/11  Yes Armandina Gemma, MD  estradiol (MINIVELLE) 0.075 MG/24HR Place 1 patch onto the skin 2 (two) times a week.    Historical Provider, MD  hyoscyamine (LEVSIN/SL) 0.125 MG SL tablet Place 1 tablet (0.125 mg total) under the tongue every 4 (four) hours as  needed. 05/11/15   Charlesetta Shanks, MD   BP 152/97 mmHg  Pulse 107  Temp(Src) 99 F (37.2 C) (Oral)  Resp 22  Ht 5\' 2"  (1.575 m)  Wt 167 lb (75.751 kg)  BMI 30.54 kg/m2  SpO2 100%   Physical Exam  Constitutional: She is oriented to person, place, and time. She appears well-developed and well-nourished.  HENT:  Head: Normocephalic and atraumatic.  Eyes: EOM are normal. Pupils are equal, round, and reactive to light.  Neck: Neck supple.  Cardiovascular: Normal rate, regular rhythm, normal heart sounds and intact distal pulses.   Pulmonary/Chest: Effort normal and breath sounds normal.  Abdominal: Soft. Bowel sounds are normal. She exhibits no distension. There is no tenderness.  Patient's abdomen is soft. There is no localizing pain. She does endorse some increased discomfort in the left lower quadrant. She reports having a. Umbilical hernia, at this time there is no hernia palpable.  Genitourinary:  Anus has several nonthrombosed hemorrhoids. There are no active fissures or lesions. Skin condition is good. Digital examination is for soft stool in the vault without palpable mass. Stool is brown. No blood to visual inspection  Musculoskeletal: Normal range of motion. She exhibits no edema.  Neurological: She is alert and oriented to person, place, and time. She has normal strength. Coordination normal. GCS eye subscore is 4. GCS verbal subscore is 5. GCS motor subscore is 6.  Skin: Skin is warm, dry and intact.  Psychiatric: She has a normal mood and affect.    ED Course  Procedures (including critical care time) DIAGNOSTIC STUDIES: Oxygen Saturation is 100% on RA, normal by my interpretation.    COORDINATION OF CARE: 3:52 PM-Discussed treatment plan with pt at bedside and pt agreed to plan.     Labs Review Labs Reviewed  URINALYSIS, ROUTINE W REFLEX MICROSCOPIC (NOT AT [facility]) - Abnormal; Notable for the following:    Specific Gravity, Urine 1.003 (*)    All other components  within normal limits  COMPREHENSIVE METABOLIC PANEL - Abnormal; Notable for the following:    Total Protein 8.3 (*)    Alkaline Phosphatase 31 (*)    All other components within normal limits  LIPASE, BLOOD  CBC WITH DIFFERENTIAL/PLATELET  OCCULT BLOOD X 1 CARD TO LAB, STOOL    Imaging Review No results found.    EKG Interpretation None      MDM   Final diagnoses:  Generalized abdominal pain   At this time patient presents well in appearance. Her abdominal examination is nonsurgical. Her abdominal pain is migratory in nature. She does note increased cramping prior to a bowel movement. At this time stool is not liquid or melanotic. No indication the patient is having constipation.  She has already had hysterectomy and appendectomy. There is no leukocytosis or fever. I feel the patient is safe to try a trial of Levsin for cramping abdominal type discomfort and follow up with her gastroenterologist. At this time there is no clinical indication for further diagnostic studies.     Charlesetta Shanks, MD 05/11/15 [phone]

## 2015-05-11 NOTE — ED Notes (Signed)
Pt reports abdominal pain all over abdomen and problems with hemorrhoids since Friday, pt with long abdominal history

## 2015-05-26 ENCOUNTER — Encounter (HOSPITAL_COMMUNITY): Payer: Self-pay | Admitting: *Deleted

## 2015-06-05 ENCOUNTER — Inpatient Hospital Stay (HOSPITAL_COMMUNITY)
Admission: RE | Admit: 2015-06-05 | Payer: No Typology Code available for payment source | Source: Ambulatory Visit | Admitting: Surgery

## 2015-06-05 HISTORY — DX: Unspecified asthma, uncomplicated: J45.909

## 2015-06-05 HISTORY — DX: Depression, unspecified: F32.A

## 2015-06-05 HISTORY — DX: Major depressive disorder, single episode, unspecified: F32.9

## 2015-06-05 HISTORY — DX: Anxiety disorder, unspecified: F41.9

## 2015-06-05 SURGERY — REPAIR, HERNIA, VENTRAL, LAPAROSCOPIC
Anesthesia: General

## 2015-06-12 ENCOUNTER — Encounter: Payer: Self-pay | Admitting: Cardiovascular Disease

## 2015-07-11 ENCOUNTER — Encounter: Payer: Self-pay | Admitting: Internal Medicine

## 2016-04-01 ENCOUNTER — Other Ambulatory Visit: Payer: Self-pay | Admitting: Obstetrics & Gynecology

## 2016-04-01 DIAGNOSIS — N63 Unspecified lump in unspecified breast: Secondary | ICD-10-CM

## 2016-04-13 NOTE — Progress Notes (Signed)
Surgery on 04/29/16.  Need orders in EPIc.  Thank YOu.

## 2016-04-14 ENCOUNTER — Ambulatory Visit: Payer: Self-pay | Admitting: Surgery

## 2016-04-27 ENCOUNTER — Encounter (HOSPITAL_COMMUNITY): Payer: Self-pay

## 2016-04-27 ENCOUNTER — Encounter (HOSPITAL_COMMUNITY)
Admission: RE | Admit: 2016-04-27 | Discharge: 2016-04-27 | Disposition: A | Discharge status: Home / Self Care | Payer: Medicare Other | Source: Ambulatory Visit | Attending: Surgery | Admitting: Surgery

## 2016-04-27 DIAGNOSIS — K430 Incisional hernia with obstruction, without gangrene: Secondary | ICD-10-CM

## 2016-04-27 DIAGNOSIS — K432 Incisional hernia without obstruction or gangrene: Secondary | ICD-10-CM | POA: Diagnosis not present

## 2016-04-27 DIAGNOSIS — Z01812 Encounter for preprocedural laboratory examination: Secondary | ICD-10-CM

## 2016-04-27 DIAGNOSIS — Z6830 Body mass index (BMI) 30.0-30.9, adult: Secondary | ICD-10-CM | POA: Diagnosis not present

## 2016-04-27 DIAGNOSIS — K219 Gastro-esophageal reflux disease without esophagitis: Secondary | ICD-10-CM | POA: Diagnosis not present

## 2016-04-27 DIAGNOSIS — E669 Obesity, unspecified: Secondary | ICD-10-CM | POA: Diagnosis not present

## 2016-04-27 DIAGNOSIS — I10 Essential (primary) hypertension: Secondary | ICD-10-CM | POA: Diagnosis not present

## 2016-04-27 DIAGNOSIS — E039 Hypothyroidism, unspecified: Secondary | ICD-10-CM | POA: Diagnosis not present

## 2016-04-27 DIAGNOSIS — J45909 Unspecified asthma, uncomplicated: Secondary | ICD-10-CM | POA: Diagnosis not present

## 2016-04-27 LAB — CBC
HCT: 41.2 % (ref 36.0–46.0)
HEMOGLOBIN: 13.5 g/dL (ref 12.0–15.0)
MCH: 29.9 pg (ref 26.0–34.0)
MCHC: 32.8 g/dL (ref 30.0–36.0)
MCV: 91.4 fL (ref 78.0–100.0)
Platelets: 272 10*3/uL (ref 150–400)
RBC: 4.51 MIL/uL (ref 3.87–5.11)
RDW: 12.6 % (ref 11.5–15.5)
WBC: 6 10*3/uL (ref 4.0–10.5)

## 2016-04-27 NOTE — Patient Instructions (Addendum)
Linda Wang  04/27/2016   Your procedure is scheduled on: 04-29-16  Report to [facility]  Entrance take [facility]  elevators to 3rd floor to  [place] at 1145 AM.  Call this number if you have problems the morning of surgery [phone]   Remember: ONLY 1 PERSON MAY GO WITH YOU TO SHORT STAY TO GET  READY MORNING OF [name]  Do not eat food or drink liquids :After Midnight.     Take these medicines the morning of surgery with A SIP OF WATER: Cetirizine(zyrtec). Estradiol. Guaifenesin.Levothyroxine. Xanax.Eye drops -usual. Inhalers -usual. DO NOT TAKE ANY DIABETIC MEDICATIONS DAY OF YOUR SURGERY                               You may not have any metal on your body including hair pins and              piercings  Do not wear jewelry, make-up, lotions, powders or perfumes, deodorant             Do not wear nail polish.  Do not shave  48 hours prior to surgery.              Men may shave face and neck.   Do not bring valuables to the hospital. [name]  Contacts, dentures or bridgework may not be worn into surgery.  Leave suitcase in the car. After surgery it may be brought to your room.     Patients discharged the day of surgery will not be allowed to drive home.  Name and phone number of your driver: Barnabas Lister -friend [phone] cell  Special Instructions: N/A              Please read over the following fact sheets you were given: _____________________________________________________________________             [facility] - Preparing for Surgery Before surgery, you can play an important role.  Because skin is not sterile, your skin needs to be as free of germs as possible.  You can reduce the number of germs on your skin by washing with CHG (chlorahexidine gluconate) soap before surgery.  CHG is an antiseptic cleaner which kills germs and bonds with the skin to continue killing germs even  after washing. Please DO NOT use if you have an allergy to CHG or antibacterial soaps.  If your skin becomes reddened/irritated stop using the CHG and inform your nurse when you arrive at Short Stay. Do not shave (including legs and underarms) for at least 48 hours prior to the first CHG shower.  You may shave your face/neck. Please follow these instructions carefully:  1.  Shower with CHG Soap the night before surgery and the  morning of Surgery.  2.  If you choose to wash your hair, wash your hair first as usual with your  normal  shampoo.  3.  After you shampoo, rinse your hair and body thoroughly to remove the  shampoo.                           4.  Use CHG as you would any other liquid soap.  You  can apply chg directly  to the skin and wash                       Gently with a scrungie or clean washcloth.  5.  Apply the CHG Soap to your body ONLY FROM THE NECK DOWN.   Do not use on face/ open                           Wound or open sores. Avoid contact with eyes, ears mouth and genitals (private parts).                       Wash face,  Genitals (private parts) with your normal soap.             6.  Wash thoroughly, paying special attention to the area where your surgery  will be performed.  7.  Thoroughly rinse your body with warm water from the neck down.  8.  DO NOT shower/wash with your normal soap after using and rinsing off  the CHG Soap.                9.  Pat yourself dry with a clean towel.            10.  Wear clean pajamas.            11.  Place clean sheets on your bed the night of your first shower and do not  sleep with pets. Day of Surgery : Do not apply any lotions/deodorants the morning of surgery.  Please wear clean clothes to the hospital/surgery center.  FAILURE TO FOLLOW THESE INSTRUCTIONS MAY RESULT IN THE CANCELLATION OF YOUR SURGERY PATIENT SIGNATURE_________________________________  NURSE  SIGNATURE__________________________________  ________________________________________________________________________

## 2016-04-28 ENCOUNTER — Encounter (HOSPITAL_COMMUNITY): Payer: Self-pay | Admitting: Surgery

## 2016-04-28 DIAGNOSIS — K432 Incisional hernia without obstruction or gangrene: Secondary | ICD-10-CM | POA: Diagnosis present

## 2016-04-28 NOTE — H&P (Signed)
General Surgery [facility]  Kalyna I. Broadus DOB: January 28, 1951 Single / Language: [place] / Race: White Female  History of Present Illness  Patient words: reschedule hernia surgery.  The patient is a 65 year old female who presents with an abdominal wall hernia.  Patient returns for follow-up of ventral incisional hernia. Patient did not have repair last year. Hernia has become progressively larger. It is not causing any significant discomfort. She is noticing more. She would like to wait and schedule surgery after her [birth year] on [birth year].   Allergies Statins Sulfonamide Derivatives Hives.  Medication History Anusol-HC (25MG  Suppository, 1 (one) suppository Rectal two times daily, Taken starting 05/26/2015) Active. Hyoscyamine Sulfate (0.125MG  Tablet, Oral) Active. Restasis (0.05% Emulsion, Ophthalmic) Active. Xanax (0.5MG  Tablet, Oral) Active. Calcium-Vitamin D (250MG  Capsule, Oral) Active. Minivelle (0.075MG /24HR Patch TW, Transdermal) Active. Synthroid (75MCG Tablet, Oral) Active. Fenofibrate (160MG  Tablet, Oral) Active. Medications Reconciled  Vitals Weight: 167 lb Temp.: 98.17F(Oral)  Pulse: 76 (Regular)  BP: 142/86 (Sitting, Left Arm, Standard)   Physical Exam The physical exam findings are as follows: Note:Limited examination  Patient is examined in a standing and recumbent position. There is a well-healed midline scar. There is an obvious bulge to the right and just superior to the umbilicus. This is soft and reducible. It is nontender. It prolapses spontaneously. Remainder the abdomen is soft and nontender without mass.   Assessment & Plan   VENTRAL INCISIONAL HERNIA WITHOUT OBSTRUCTION OR GANGRENE (K43.2)  Patient again discussed the procedure. We discussed the location of the surgical incisions. We discussed restrictions on her activities after the procedure. We discussed the hospital stay to be anticipated. She  understands and wishes to proceed.  The risks and benefits of the procedure have been discussed at length with the patient. The patient understands the proposed procedure, potential alternative treatments, and the course of recovery to be expected. All of the patient's questions have been answered at this time. The patient wishes to proceed with surgery.  Earnstine Regal, MD, [facility] Office: [phone]

## 2016-04-29 ENCOUNTER — Observation Stay (HOSPITAL_COMMUNITY)
Admission: RE | Admit: 2016-04-29 | Discharge: 2016-05-02 | Disposition: A | Discharge status: Home / Self Care | Payer: Medicare Other | Source: Ambulatory Visit | Attending: Surgery | Admitting: Surgery

## 2016-04-29 ENCOUNTER — Encounter (HOSPITAL_COMMUNITY)
Admission: RE | Disposition: A | Discharge status: Home / Self Care | Payer: Self-pay | Source: Ambulatory Visit | Attending: Surgery

## 2016-04-29 ENCOUNTER — Ambulatory Visit (HOSPITAL_COMMUNITY): Payer: Medicare Other | Admitting: Certified Registered"

## 2016-04-29 ENCOUNTER — Encounter (HOSPITAL_COMMUNITY): Payer: Self-pay | Admitting: *Deleted

## 2016-04-29 DIAGNOSIS — K432 Incisional hernia without obstruction or gangrene: Principal | ICD-10-CM | POA: Diagnosis present

## 2016-04-29 DIAGNOSIS — K219 Gastro-esophageal reflux disease without esophagitis: Secondary | ICD-10-CM | POA: Diagnosis not present

## 2016-04-29 DIAGNOSIS — Z6830 Body mass index (BMI) 30.0-30.9, adult: Secondary | ICD-10-CM | POA: Insufficient documentation

## 2016-04-29 DIAGNOSIS — J45909 Unspecified asthma, uncomplicated: Secondary | ICD-10-CM | POA: Diagnosis not present

## 2016-04-29 DIAGNOSIS — I10 Essential (primary) hypertension: Secondary | ICD-10-CM | POA: Diagnosis not present

## 2016-04-29 DIAGNOSIS — E669 Obesity, unspecified: Secondary | ICD-10-CM | POA: Insufficient documentation

## 2016-04-29 DIAGNOSIS — E039 Hypothyroidism, unspecified: Secondary | ICD-10-CM | POA: Insufficient documentation

## 2016-04-29 HISTORY — PX: INSERTION OF MESH: SHX5868

## 2016-04-29 HISTORY — PX: VENTRAL HERNIA REPAIR: SHX424

## 2016-04-29 SURGERY — REPAIR, HERNIA, VENTRAL, LAPAROSCOPIC
Anesthesia: General

## 2016-04-29 MED ORDER — ACETAMINOPHEN 650 MG RE SUPP: 650.0000 mg | Freq: Four times a day (QID) | RECTAL | Status: DC | PRN

## 2016-04-29 MED ORDER — LIDOCAINE 2% (20 MG/ML) 5 ML SYRINGE
INTRAMUSCULAR | Status: DC | PRN
  Administered 2016-04-29: 140 mg via INTRAVENOUS

## 2016-04-29 MED ORDER — SUGAMMADEX SODIUM 200 MG/2ML IV SOLN
INTRAVENOUS | Status: DC | PRN
  Administered 2016-04-29: 160 mg via INTRAVENOUS

## 2016-04-29 MED ORDER — KCL IN DEXTROSE-NACL 20-5-0.45 MEQ/L-%-% IV SOLN
INTRAVENOUS | Status: AC
  Filled 2016-04-29: qty 1000

## 2016-04-29 MED ORDER — HYDROMORPHONE HCL 1 MG/ML IJ SOLN
INTRAMUSCULAR | Status: AC
  Administered 2016-04-29: 0.5 mg via INTRAVENOUS
  Filled 2016-04-29: qty 1

## 2016-04-29 MED ORDER — BUPIVACAINE HCL (PF) 0.25 % IJ SOLN
INTRAMUSCULAR | Status: DC | PRN
  Administered 2016-04-29: 15 mL

## 2016-04-29 MED ORDER — PHENYLEPHRINE 40 MCG/ML (10ML) SYRINGE FOR IV PUSH (FOR BLOOD PRESSURE SUPPORT)
PREFILLED_SYRINGE | INTRAVENOUS | Status: AC
  Filled 2016-04-29: qty 10

## 2016-04-29 MED ORDER — ESTRADIOL 1 MG PO TABS
1.0000 mg | ORAL_TABLET | Freq: Every day | ORAL | Status: DC
  Administered 2016-04-30 – 2016-05-02 (×3): 1 mg via ORAL
  Filled 2016-04-29 (×3): qty 1

## 2016-04-29 MED ORDER — HYDROCODONE-ACETAMINOPHEN 5-325 MG PO TABS
1.0000 | ORAL_TABLET | ORAL | Status: DC | PRN
  Administered 2016-04-30 (×2): 1 via ORAL
  Filled 2016-04-29: qty 2
  Filled 2016-04-29: qty 1
  Filled 2016-04-29: qty 2
  Filled 2016-04-29: qty 1

## 2016-04-29 MED ORDER — CHLORHEXIDINE GLUCONATE CLOTH 2 % EX PADS: 6.0000 | MEDICATED_PAD | Freq: Once | CUTANEOUS | Status: DC

## 2016-04-29 MED ORDER — LIDOCAINE 2% (20 MG/ML) 5 ML SYRINGE
INTRAMUSCULAR | Status: AC
  Filled 2016-04-29: qty 10

## 2016-04-29 MED ORDER — DEXAMETHASONE SODIUM PHOSPHATE 10 MG/ML IJ SOLN
INTRAMUSCULAR | Status: DC | PRN
  Administered 2016-04-29: 10 mg via INTRAVENOUS

## 2016-04-29 MED ORDER — ONDANSETRON HCL 4 MG/2ML IJ SOLN
4.0000 mg | Freq: Four times a day (QID) | INTRAMUSCULAR | Status: DC | PRN
  Administered 2016-04-30: 4 mg via INTRAVENOUS
  Filled 2016-04-29: qty 2

## 2016-04-29 MED ORDER — CYCLOSPORINE 0.05 % OP EMUL
1.0000 [drp] | Freq: Two times a day (BID) | OPHTHALMIC | Status: DC
  Administered 2016-04-29 – 2016-05-01 (×4): 1 [drp] via OPHTHALMIC
  Filled 2016-04-29 (×6): qty 1

## 2016-04-29 MED ORDER — ALBUTEROL SULFATE (2.5 MG/3ML) 0.083% IN NEBU
3.0000 mL | INHALATION_SOLUTION | Freq: Two times a day (BID) | RESPIRATORY_TRACT | Status: DC
  Administered 2016-04-30: 3 mL via RESPIRATORY_TRACT
  Filled 2016-04-29: qty 3

## 2016-04-29 MED ORDER — LACTATED RINGERS IV SOLN
INTRAVENOUS | Status: DC
  Administered 2016-04-29 (×3): via INTRAVENOUS

## 2016-04-29 MED ORDER — FENTANYL CITRATE (PF) 250 MCG/5ML IJ SOLN
INTRAMUSCULAR | Status: AC
  Filled 2016-04-29: qty 5

## 2016-04-29 MED ORDER — PROPOFOL 10 MG/ML IV BOLUS
INTRAVENOUS | Status: AC
  Filled 2016-04-29: qty 20

## 2016-04-29 MED ORDER — SUGAMMADEX SODIUM 200 MG/2ML IV SOLN
INTRAVENOUS | Status: AC
  Filled 2016-04-29: qty 4

## 2016-04-29 MED ORDER — LIP MEDEX EX OINT
TOPICAL_OINTMENT | CUTANEOUS | Status: AC
  Filled 2016-04-29: qty 7

## 2016-04-29 MED ORDER — 0.9 % SODIUM CHLORIDE (POUR BTL) OPTIME
TOPICAL | Status: DC | PRN
  Administered 2016-04-29: 1000 mL

## 2016-04-29 MED ORDER — EPHEDRINE SULFATE 50 MG/ML IJ SOLN
INTRAMUSCULAR | Status: DC | PRN
  Administered 2016-04-29: 15 mg via INTRAVENOUS
  Administered 2016-04-29 (×2): 5 mg via INTRAVENOUS

## 2016-04-29 MED ORDER — FENTANYL CITRATE (PF) 100 MCG/2ML IJ SOLN
INTRAMUSCULAR | Status: AC
  Administered 2016-04-29: 50 ug via INTRAVENOUS
  Filled 2016-04-29: qty 2

## 2016-04-29 MED ORDER — HYDROMORPHONE HCL 1 MG/ML IJ SOLN
0.2500 mg | INTRAMUSCULAR | Status: DC | PRN
  Administered 2016-04-29 (×3): 0.5 mg via INTRAVENOUS

## 2016-04-29 MED ORDER — FENTANYL CITRATE (PF) 100 MCG/2ML IJ SOLN
25.0000 ug | INTRAMUSCULAR | Status: DC | PRN
  Administered 2016-04-29 (×3): 50 ug via INTRAVENOUS

## 2016-04-29 MED ORDER — ONDANSETRON HCL 4 MG/2ML IJ SOLN
INTRAMUSCULAR | Status: DC | PRN
  Administered 2016-04-29: 4 mg via INTRAVENOUS

## 2016-04-29 MED ORDER — FENTANYL CITRATE (PF) 250 MCG/5ML IJ SOLN
INTRAMUSCULAR | Status: DC | PRN
  Administered 2016-04-29: 100 ug via INTRAVENOUS
  Administered 2016-04-29 (×3): 50 ug via INTRAVENOUS
  Administered 2016-04-29: 150 ug via INTRAVENOUS

## 2016-04-29 MED ORDER — HYDROMORPHONE HCL 1 MG/ML IJ SOLN
1.0000 mg | INTRAMUSCULAR | Status: DC | PRN
  Administered 2016-04-29 (×2): 1 mg via INTRAVENOUS
  Filled 2016-04-29 (×2): qty 1

## 2016-04-29 MED ORDER — ROCURONIUM BROMIDE 50 MG/5ML IV SOSY
PREFILLED_SYRINGE | INTRAVENOUS | Status: AC
  Filled 2016-04-29: qty 10

## 2016-04-29 MED ORDER — ALPRAZOLAM 0.25 MG PO TABS
0.2500 mg | ORAL_TABLET | Freq: Three times a day (TID) | ORAL | Status: DC | PRN
  Administered 2016-04-29: 0.25 mg via ORAL
  Filled 2016-04-29 (×2): qty 1

## 2016-04-29 MED ORDER — ONDANSETRON 4 MG PO TBDP
4.0000 mg | ORAL_TABLET | Freq: Four times a day (QID) | ORAL | Status: DC | PRN
  Administered 2016-05-01: 4 mg via ORAL
  Filled 2016-04-29: qty 1

## 2016-04-29 MED ORDER — ONDANSETRON HCL 4 MG/2ML IJ SOLN: 4.0000 mg | Freq: Once | INTRAMUSCULAR | Status: DC | PRN

## 2016-04-29 MED ORDER — ONDANSETRON HCL 4 MG/2ML IJ SOLN
INTRAMUSCULAR | Status: AC
  Filled 2016-04-29: qty 2

## 2016-04-29 MED ORDER — MIDAZOLAM HCL 2 MG/2ML IJ SOLN
INTRAMUSCULAR | Status: AC
  Filled 2016-04-29: qty 2

## 2016-04-29 MED ORDER — MIDAZOLAM HCL 2 MG/2ML IJ SOLN
INTRAMUSCULAR | Status: DC | PRN
  Administered 2016-04-29: 2 mg via INTRAVENOUS

## 2016-04-29 MED ORDER — ACETAMINOPHEN 325 MG PO TABS
650.0000 mg | ORAL_TABLET | Freq: Four times a day (QID) | ORAL | Status: DC | PRN
  Administered 2016-05-01: 650 mg via ORAL
  Administered 2016-05-01 (×2): 325 mg via ORAL
  Filled 2016-04-29 (×3): qty 2

## 2016-04-29 MED ORDER — SUCCINYLCHOLINE CHLORIDE 200 MG/10ML IV SOSY
PREFILLED_SYRINGE | INTRAVENOUS | Status: DC | PRN
  Administered 2016-04-29: 100 mg via INTRAVENOUS

## 2016-04-29 MED ORDER — PHENYLEPHRINE HCL 10 MG/ML IJ SOLN
INTRAMUSCULAR | Status: DC | PRN
Start: 2016-04-29 — End: 2016-04-29
  Administered 2016-04-29 (×8): 80 ug via INTRAVENOUS

## 2016-04-29 MED ORDER — LEVOTHYROXINE SODIUM 75 MCG PO TABS
75.0000 ug | ORAL_TABLET | Freq: Every day | ORAL | Status: DC
  Administered 2016-04-30 – 2016-05-02 (×3): 75 ug via ORAL
  Filled 2016-04-29 (×3): qty 1

## 2016-04-29 MED ORDER — SUCCINYLCHOLINE CHLORIDE 20 MG/ML IJ SOLN
INTRAMUSCULAR | Status: AC
  Filled 2016-04-29: qty 1

## 2016-04-29 MED ORDER — CEFAZOLIN SODIUM-DEXTROSE 2-4 GM/100ML-% IV SOLN
INTRAVENOUS | Status: AC
  Filled 2016-04-29: qty 100

## 2016-04-29 MED ORDER — PROPOFOL 10 MG/ML IV BOLUS
INTRAVENOUS | Status: DC | PRN
  Administered 2016-04-29: 70 mg via INTRAVENOUS
  Administered 2016-04-29: 130 mg via INTRAVENOUS

## 2016-04-29 MED ORDER — KCL IN DEXTROSE-NACL 20-5-0.45 MEQ/L-%-% IV SOLN
INTRAVENOUS | Status: DC
  Administered 2016-04-29 – 2016-04-30 (×3): via INTRAVENOUS
  Filled 2016-04-29 (×4): qty 1000

## 2016-04-29 MED ORDER — EPHEDRINE 5 MG/ML INJ
INTRAVENOUS | Status: AC
  Filled 2016-04-29: qty 20

## 2016-04-29 MED ORDER — CEFAZOLIN SODIUM-DEXTROSE 2-4 GM/100ML-% IV SOLN
2.0000 g | INTRAVENOUS | Status: AC
  Administered 2016-04-29: 2 g via INTRAVENOUS

## 2016-04-29 MED ORDER — BUPIVACAINE HCL (PF) 0.25 % IJ SOLN
INTRAMUSCULAR | Status: AC
  Filled 2016-04-29: qty 30

## 2016-04-29 MED ORDER — ROCURONIUM BROMIDE 10 MG/ML (PF) SYRINGE
PREFILLED_SYRINGE | INTRAVENOUS | Status: DC | PRN
  Administered 2016-04-29: 30 mg via INTRAVENOUS
  Administered 2016-04-29: 10 mg via INTRAVENOUS

## 2016-04-29 SURGICAL SUPPLY — 37 items
APL SKNCLS STERI-STRIP NONHPOA (GAUZE/BANDAGES/DRESSINGS) ×1
BENZOIN TINCTURE PRP APPL 2/3 (GAUZE/BANDAGES/DRESSINGS) ×3 IMPLANT
BINDER ABDOMINAL 12 ML 46-62 (SOFTGOODS) IMPLANT
CHLORAPREP W/TINT 26ML (MISCELLANEOUS) ×3 IMPLANT
CLOSURE WOUND 1/2 X4 (GAUZE/BANDAGES/DRESSINGS) ×2
COVER SURGICAL LIGHT HANDLE (MISCELLANEOUS) ×3 IMPLANT
DECANTER SPIKE VIAL GLASS SM (MISCELLANEOUS) ×3 IMPLANT
DEVICE SECURE STRAP 25 ABSORB (INSTRUMENTS) ×6 IMPLANT
DEVICE TROCAR PUNCTURE CLOSURE (ENDOMECHANICALS) ×3 IMPLANT
DRAPE INCISE IOBAN 66X45 STRL (DRAPES) IMPLANT
DRAPE UTILITY XL STRL (DRAPES) ×3 IMPLANT
ELECT REM PT RETURN 9FT ADLT (ELECTROSURGICAL) ×3
ELECTRODE REM PT RTRN 9FT ADLT (ELECTROSURGICAL) ×1 IMPLANT
GLOVE SURG ORTHO 8.0 STRL STRW (GLOVE) ×18 IMPLANT
GOWN STRL REUS W/TWL XL LVL3 (GOWN DISPOSABLE) ×12 IMPLANT
IRRIG SUCT STRYKERFLOW 2 WTIP (MISCELLANEOUS)
IRRIGATION SUCT STRKRFLW 2 WTP (MISCELLANEOUS) IMPLANT
KIT BASIN OR (CUSTOM PROCEDURE TRAY) ×3 IMPLANT
MARKER SKIN DUAL TIP RULER LAB (MISCELLANEOUS) ×3 IMPLANT
MESH VENTRALIGHT ST 8X10 (Mesh General) ×3 IMPLANT
NEEDLE SPNL 22GX3.5 QUINCKE BK (NEEDLE) ×3 IMPLANT
PAD POSITIONING PINK XL (MISCELLANEOUS) IMPLANT
POSITIONER SURGICAL ARM (MISCELLANEOUS) IMPLANT
SCISSORS LAP 5X35 DISP (ENDOMECHANICALS) ×3 IMPLANT
SHEARS HARMONIC ACE PLUS 36CM (ENDOMECHANICALS) IMPLANT
SLEEVE XCEL OPT CAN 5 100 (ENDOMECHANICALS) ×12 IMPLANT
SOLUTION ANTI FOG 6CC (MISCELLANEOUS) ×3 IMPLANT
STRIP CLOSURE SKIN 1/2X4 (GAUZE/BANDAGES/DRESSINGS) ×4 IMPLANT
SUT NOVA NAB DX-16 0-1 5-0 T12 (SUTURE) ×6 IMPLANT
TACKER 5MM HERNIA 3.5CML NAB (ENDOMECHANICALS) IMPLANT
TAPE CLOTH 4X10 WHT NS (GAUZE/BANDAGES/DRESSINGS) IMPLANT
TOWEL OR 17X26 10 PK STRL BLUE (TOWEL DISPOSABLE) ×3 IMPLANT
TOWEL OR NON WOVEN STRL DISP B (DISPOSABLE) ×3 IMPLANT
TRAY FOLEY W/METER SILVER 16FR (SET/KITS/TRAYS/PACK) ×3 IMPLANT
TRAY LAPAROSCOPIC (CUSTOM PROCEDURE TRAY) ×3 IMPLANT
TROCAR BLADELESS OPT 5 100 (ENDOMECHANICALS) ×3 IMPLANT
TUBING INSUF HEATED (TUBING) ×3 IMPLANT

## 2016-04-29 NOTE — Anesthesia Procedure Notes (Addendum)
Procedure Name: Intubation Date/Time: 04/29/2016 2:35 PM Performed by: Cynda Familia Pre-anesthesia Checklist: Patient identified, Emergency Drugs available, Suction available and Patient being monitored Patient Re-evaluated:Patient Re-evaluated prior to inductionOxygen Delivery Method: Circle System Utilized Preoxygenation: Pre-oxygenation with 100% oxygen Intubation Type: IV induction Ventilation: Mask ventilation without difficulty Laryngoscope Size: Miller and 2 Grade View: Grade II Tube type: Oral Number of attempts: 1 Airway Equipment and Method: Stylet Placement Confirmation: ETT inserted through vocal cords under direct vision,  positive ETCO2 and breath sounds checked- equal and bilateral Secured at: 22 cm Tube secured with: Tape Dental Injury: Teeth and Oropharynx as per pre-operative assessment  Comments: Smooth IV induction Turk-- intubation AM CRNA atraumatic--- teeth and mouth as preop--- bilat BS Turk--- anterior view-- consider  Glidescope

## 2016-04-29 NOTE — Brief Op Note (Signed)
04/29/2016  4:04 PM  PATIENT:  Linda Wang  65 y.o. female  PRE-OPERATIVE DIAGNOSIS:  ventral incisional hernia  POST-OPERATIVE DIAGNOSIS:  ventral incisional hernia  PROCEDURE:  Procedure(s): LAPAROSCOPIC VENTRAL INCISIONAL  HERNIA (N/A) INSERTION OF MESH (N/A)  SURGEON:  Surgeon(s) and Role:    * Armandina Gemma, MD - Primary  ASSISTANTS: none   ANESTHESIA:   general  EBL:  Total I/O In: 1600 [I.V.:1600] Out: 125 [Urine:100; Blood:25]  BLOOD ADMINISTERED:none  DRAINS: none   LOCAL MEDICATIONS USED:  MARCAINE     SPECIMEN:  No Specimen  DISPOSITION OF SPECIMEN:  N/A  COUNTS:  YES  TOURNIQUET:  * No tourniquets in log *  DICTATION: .Other Dictation: Dictation Number [id number]  PLAN OF CARE: Admit for overnight observation  PATIENT DISPOSITION:  PACU - hemodynamically stable.   Delay start of Pharmacological VTE agent (>24hrs) due to surgical blood loss or risk of bleeding: yes  Earnstine Regal, MD, [facility] Office: [phone]

## 2016-04-29 NOTE — Anesthesia Postprocedure Evaluation (Signed)
Anesthesia Post Note  Patient: Linda Wang  Procedure(s) Performed: Procedure(s) (LRB): LAPAROSCOPIC VENTRAL INCISIONAL  HERNIA (N/A) INSERTION OF MESH (N/A)  Patient location during evaluation: PACU Anesthesia Type: General Level of consciousness: awake and alert Pain management: pain level controlled Vital Signs Assessment: post-procedure vital signs reviewed and stable Respiratory status: spontaneous breathing, nonlabored ventilation, respiratory function stable and patient connected to nasal cannula oxygen Cardiovascular status: blood pressure returned to baseline and stable Postop Assessment: no signs of nausea or vomiting Anesthetic complications: no    Last Vitals:  Vitals:   04/29/16 1730 04/29/16 1807  BP: (!) 143/76 (!) 159/92  Pulse: (!) 113 (!) 116  Resp: 12 14  Temp: 36.7 C 36.8 C    Last Pain:  Vitals:   04/29/16 1807  PainSc: [name]

## 2016-04-29 NOTE — Interval H&P Note (Signed)
History and Physical Interval Note:  04/29/2016 2:21 PM  Linda Wang  has presented today for surgery, with the diagnosis of ventral incisional hernia  The various methods of treatment have been discussed with the patient and family. After consideration of risks, benefits and other options for treatment, the patient has consented to    Procedure(s): [place] (N/A) INSERTION OF MESH (N/A) as a surgical intervention .    The patient's history has been reviewed, patient examined, no change in status, stable for surgery.  I have reviewed the patient's chart and labs.  Questions were answered to the patient's satisfaction.    Earnstine Regal, MD, [facility] Office: [place]

## 2016-04-29 NOTE — Transfer of Care (Signed)
Immediate Anesthesia Transfer of Care Note  Patient: Linda Wang  Procedure(s) Performed: Procedure(s): LAPAROSCOPIC VENTRAL INCISIONAL  HERNIA (N/A) INSERTION OF MESH (N/A)  Patient Location: PACU  Anesthesia Type:General  Level of Consciousness: awake, alert , oriented and patient cooperative  Airway & Oxygen Therapy: Patient Spontanous Breathing and Patient connected to face mask oxygen  Post-op Assessment: Report given to RN, Post -op Vital signs reviewed and stable and Patient moving all extremities  Post vital signs: Reviewed and stable  Last Vitals: There were no vitals filed for this visit.  Last Pain: There were no vitals filed for this visit.       Complications: No apparent anesthesia complications

## 2016-04-29 NOTE — Anesthesia Preprocedure Evaluation (Signed)
Anesthesia Evaluation  Patient identified by MRN, date of birth, ID band Patient awake    Reviewed: Allergy & Precautions, NPO status , Patient's Chart, lab work & pertinent test results  Airway Mallampati: II  TM Distance: >3 FB Neck ROM: Full    Dental  (+) Teeth Intact, Dental Advisory Given   Pulmonary asthma ,    Pulmonary exam normal breath sounds clear to auscultation       Cardiovascular hypertension, Normal cardiovascular exam Rhythm:Regular Rate:Normal     Neuro/Psych PSYCHIATRIC DISORDERS Anxiety Depression negative neurological ROS     GI/Hepatic Neg liver ROS, GERD  ,  Endo/Other  Hypothyroidism Obesity   Renal/GU negative Renal ROS     Musculoskeletal  (+) Arthritis , Osteoarthritis,  Bilateral TMJ arthritis   Abdominal   Peds  Hematology negative hematology ROS (+)   Anesthesia Other Findings Day of surgery medications reviewed with the patient.  Reproductive/Obstetrics                             Anesthesia Physical Anesthesia Plan  ASA: II  Anesthesia Plan: General   Post-op Pain Management:    Induction: Intravenous  Airway Management Planned: Oral ETT  Additional Equipment:   Intra-op Plan:   Post-operative Plan: Extubation in OR  Informed Consent: I have reviewed the patients History and Physical, chart, labs and discussed the procedure including the risks, benefits and alternatives for the proposed anesthesia with the patient or authorized representative who has indicated his/her understanding and acceptance.   Dental advisory given  Plan Discussed with: CRNA  Anesthesia Plan Comments: (Risks/benefits of general anesthesia discussed with patient including risk of damage to teeth, lips, gum, and tongue, nausea/vomiting, allergic reactions to medications, and the possibility of heart attack, stroke and death.  All patient questions answered.  Patient  wishes to proceed.)        Anesthesia Quick Evaluation

## 2016-04-30 ENCOUNTER — Encounter (HOSPITAL_COMMUNITY): Payer: Self-pay | Admitting: Surgery

## 2016-04-30 DIAGNOSIS — K432 Incisional hernia without obstruction or gangrene: Secondary | ICD-10-CM | POA: Diagnosis not present

## 2016-04-30 MED ORDER — KETOROLAC TROMETHAMINE 15 MG/ML IJ SOLN
15.0000 mg | Freq: Four times a day (QID) | INTRAMUSCULAR | Status: AC
  Administered 2016-04-30 – 2016-05-01 (×4): 15 mg via INTRAVENOUS
  Filled 2016-04-30 (×4): qty 1

## 2016-04-30 MED ORDER — ALBUTEROL SULFATE (2.5 MG/3ML) 0.083% IN NEBU: 3.0000 mL | INHALATION_SOLUTION | Freq: Two times a day (BID) | RESPIRATORY_TRACT | Status: DC | PRN

## 2016-04-30 MED ORDER — ALUM & MAG HYDROXIDE-SIMETH 200-200-20 MG/5ML PO SUSP
30.0000 mL | Freq: Four times a day (QID) | ORAL | Status: DC | PRN
  Administered 2016-04-30: 30 mL via ORAL
  Filled 2016-04-30: qty 30

## 2016-04-30 MED ORDER — ALPRAZOLAM 0.5 MG PO TABS
0.5000 mg | ORAL_TABLET | Freq: Three times a day (TID) | ORAL | Status: DC | PRN
  Administered 2016-04-30 – 2016-05-01 (×4): 0.5 mg via ORAL
  Filled 2016-04-30 (×5): qty 1

## 2016-04-30 NOTE — Op Note (Signed)
Linda Wang, PACITTI                   ACCOUNT NO.:  [id number]  MEDICAL RECORD NO.:  [id number]  LOCATION:  [id number]                         FACILITY:  [facility]  PHYSICIAN:  Earnstine Regal, MD      DATE OF BIRTH:  1951/02/11  DATE OF PROCEDURE:  04/29/2016                              OPERATIVE REPORT   PREOPERATIVE DIAGNOSIS:  Ventral incisional hernia.  POSTOPERATIVE DIAGNOSIS:  Ventral incisional hernia.  PROCEDURE:  Laparoscopic ventral incisional hernia repair with Ventralex mesh (20 x 25 cm).  SURGEON:  Earnstine Regal, MD, FACS  ANESTHESIA:  General.  ESTIMATED BLOOD LOSS:  Minimal.  PREPARATION:  ChloraPrep.  COMPLICATIONS:  None.  INDICATIONS:  The patient is a 65 year old female, who underwent sigmoid colectomy for diverticular disease in the spring of 2014.  The patient subsequently developed incisional hernia just above the level of the umbilicus.  The patient now comes to Surgery for laparoscopic repair.  BODY OF REPORT:  Procedure was done in OR #4 at the [facility].  The patient was brought to the operating room and placed in supine position on the operating room table.  Following administration of general anesthesia, the patient was positioned and then prepped and draped in usual aseptic fashion.  After ascertaining that an adequate level of anesthesia had been achieved, an incision was made at the left costal margin with a #15 blade.  Using a 5-mm Optiview trocar, the laparoscope was introduced into the peritoneal cavity under direct vision.  Pneumoperitoneum was established.  Laparoscope was placed into the peritoneal cavity.  There were few adhesions of the omentum to the lower midline suture line.  There was a hernia defect just above the suture line in the midline above the level of the umbilicus.  This defect measures approximately 3 cm in greatest diameter.  A second operating trocar was placed in the left lower quadrant.  Using gentle blunt  dissection, adhesions were taken down off the suture line mobilizing the omentum.  On inspection of the suture line, there was a 1-cm fascial defect near the inferior portion of the suture line in the suprapubic region.  This was not detectable clinically.  Decision was made to place a large sheet of mesh so as to encompass the entire previous midline incision and overlap the inferior defect and superior defect by approximately 4 cm each.  Therefore, measurement, a 20 cm x 25 cm sheet of Ventralex mesh was selected.  Based on the abdomen, eight positions were marked for sutures.  Sutures were then placed in the mesh circumferentially at eight different equally positions using #1 Novafil suture.  Mesh was then moistened, rolled, and inserted through the 11-mm trocar in the left lower quadrant into the peritoneal cavity.  It was then deployed with the roughened side upwards towards the abdominal wall.  A second set of operating trocars were placed under direct vision in the right lower quadrant and left upper quadrant, both being 5-mm trocars.  Incisions were then made on the periphery of the mesh on the abdominal wall.  Using the [facility], the suture tags were retrieved through the abdominal wall and then all sutures  were pulled, taut, elevating the mesh against the abdominal wall with broad coverage of the midline incision and both hernia defects. All eight #1 Novafil sutures were then tied securely and suture tails excised.  Next, a SecureStrap was used to affix the periphery of the mesh to the anterior abdominal wall.  A total of 47 SecureStraps were placed in concentric circles around the mesh with good approximation of the mesh to the anterior abdominal wall.  Good hemostasis was noted.  Ports were removed under direct vision and pneumoperitoneum was released.  Good hemostasis was noted.  All ports were removed.  Port sites were anesthetized with local anesthetic.  Incisions  were closed with interrupted 4-0 Monocryl subcuticular sutures.  Wounds were washed and dried and Steri-Strips were applied. Sterile gauze dressings were applied.  A 12-inch abdominal binder was applied.  The patient was awakened from anesthesia and brought to the recovery room.  The patient tolerated the procedure well.   Earnstine Regal, MD, [facility] Office: [phone]    TMG/MEDQ  D:  04/29/2016  T:  04/30/2016  Job:  [id number]

## 2016-04-30 NOTE — Progress Notes (Signed)
   04/30/16 1000  Clinical Encounter Type  Visited With Patient  Visit Type Initial  Referral From Nurse  Consult/Referral To Chaplain  Spiritual Encounters  Spiritual Needs Emotional;Prayer  Stress Factors  Patient Stress Factors Health changes;Loss  Family Stress Factors Loss  [facility]P visited patient after hearing need at morning huddle. Nurse indicated patient was anxious because of brother's health (at [facility] with prostrate cancer). CHP provided ministry of presence, listening and prayer. Roe Coombs 04/30/16

## 2016-04-30 NOTE — Care Management Obs Status (Signed)
[facility] NOTIFICATION   Patient Details  Name: Linda Wang MRN: [id number] Date of Birth: 1950-12-10   Medicare Observation Status Notification Given:  Yes    Guadalupe Maple, RN 04/30/2016, 12:47 PM

## 2016-04-30 NOTE — Progress Notes (Signed)
  General Surgery [facility]  04/30/2016  Assessment & Plan: POD#1 Status post laparoscopic ventral incisional hernia repair with mesh  Encourage OOB, ambulate  Will add Toradol for pain relief  Xanax for anxiety prn  Clear liquid diet - advance as tolerated  Home 1-2 days        Earnstine Regal, MD, [facility]       Office: [phone]    Subjective: Patient complains of nausea, anxious.  Has not been OOB.  Objective: Vital signs in last 24 hours: Temp:  [97.8 F (36.6 C)-99.3 F (37.4 C)] 99.3 F (37.4 C) (10/27 0636) Pulse Rate:  [80-117] 107 (10/27 0636) Resp:  [12-17] 15 (10/27 0636) BP: (132-159)/(74-102) 138/74 (10/27 0636) SpO2:  [95 %-100 %] 96 % (10/27 0636) Weight:  [75.3 kg (166 lb)] 75.3 kg (166 lb) (10/26 1217)    Intake/Output from previous day: 10/26 0701 - 10/27 0700 In: 2847.5 [I.V.:2847.5] Out: [id number] [Urine:1795; Blood:25] Intake/Output this shift: No intake/output data recorded.  Physical Exam: HEENT - sclerae clear, mucous membranes moist Neck - soft Chest - clear bilaterally Cor - RRR Abdomen - soft, mild distension; dressings dry and intact; binder replaced Ext - no edema, non-tender Neuro - alert & oriented, no focal deficits  Lab Results:   Recent Labs  04/27/16 1530  WBC 6.0  HGB 13.5  HCT 41.2  PLT 272   BMET No results for input(s): NA, K, CL, CO2, GLUCOSE, BUN, CREATININE, CALCIUM in the last 72 hours. PT/INR No results for input(s): LABPROT, INR in the last 72 hours. Comprehensive Metabolic Panel:    Component Value Date/Time   NA 135 05/11/2015 1600   NA 133 (L) 04/09/2013 1511   K 3.6 05/11/2015 1600   K 4.0 04/09/2013 1511   CL 103 05/11/2015 1600   CL 100 04/09/2013 1511   CO2 25 05/11/2015 1600   CO2 26 04/09/2013 1511   BUN 14 05/11/2015 1600   BUN 11 04/09/2013 1511   CREATININE 0.72 05/11/2015 1600   CREATININE 0.6 04/09/2013 1511   GLUCOSE 93  05/11/2015 1600   GLUCOSE 87 04/09/2013 1511   CALCIUM 9.2 05/11/2015 1600   CALCIUM 9.0 04/09/2013 1511   AST 21 05/11/2015 1600   AST 19 08/23/2012 1552   ALT 19 05/11/2015 1600   ALT 18 08/23/2012 1552   ALKPHOS 31 (L) 05/11/2015 1600   ALKPHOS 34 (L) 08/23/2012 1552   BILITOT 0.6 05/11/2015 1600   BILITOT 0.4 08/23/2012 1552   PROT 8.3 (H) 05/11/2015 1600   PROT 7.8 08/23/2012 1552   ALBUMIN 4.4 05/11/2015 1600   ALBUMIN 4.0 08/23/2012 1552    Studies/Results: No results found.    [name] 04/30/2016  Patient ID: Linda Wang, female   DOB: 04-19-1951, 65 y.o.   MRN: [id number]

## 2016-05-01 DIAGNOSIS — K432 Incisional hernia without obstruction or gangrene: Secondary | ICD-10-CM | POA: Diagnosis not present

## 2016-05-01 MED ORDER — HYDROCODONE-ACETAMINOPHEN 5-325 MG PO TABS: 1.0000 | ORAL_TABLET | ORAL | 0 refills | Status: DC | PRN

## 2016-05-01 NOTE — Progress Notes (Signed)
  General Surgery [facility]  05/01/2016  Assessment & Plan: POD#2 Status post laparoscopic ventral incisional hernia repair with mesh             Encourage OOB, ambulate             Advance to regular diet  PO pain Rx             Home today or tomorrow        Earnstine Regal, MD, [facility]       Office: [phone]    Subjective: Pain somewhat improved, ambulatory, passing flatus.  Nauseated this AM.  Objective: Vital signs in last 24 hours: Temp:  [97.5 F (36.4 C)-98.9 F (37.2 C)] 98.9 F (37.2 C) (10/28 0556) Pulse Rate:  [63-98] 98 (10/28 0556) Resp:  [17-20] 18 (10/28 0556) BP: (107-148)/(63-95) 133/95 (10/28 0556) SpO2:  [93 %-95 %] 93 % (10/28 0556) Last BM Date: 04/29/16  Intake/Output from previous day: 10/27 0701 - 10/28 0700 In: 2745 [P.O.:1320; I.V.:1425] Out: 1600 [Urine:1600] Intake/Output this shift: Total I/O In: 120 [P.O.:120] Out: -   Physical Exam: HEENT - sclerae clear, mucous membranes moist Neck - soft Chest - clear bilaterally Cor - RRR Abdomen - softer, dressings dry and intact; binder replaced; BS present Ext - no edema, non-tender Neuro - alert & oriented, no focal deficits  Lab Results:  No results for input(s): WBC, HGB, HCT, PLT in the last 72 hours. BMET No results for input(s): NA, K, CL, CO2, GLUCOSE, BUN, CREATININE, CALCIUM in the last 72 hours. PT/INR No results for input(s): LABPROT, INR in the last 72 hours. Comprehensive Metabolic Panel:    Component Value Date/Time   NA 135 05/11/2015 1600   NA 133 (L) 04/09/2013 1511   K 3.6 05/11/2015 1600   K 4.0 04/09/2013 1511   CL 103 05/11/2015 1600   CL 100 04/09/2013 1511   CO2 25 05/11/2015 1600   CO2 26 04/09/2013 1511   BUN 14 05/11/2015 1600   BUN 11 04/09/2013 1511   CREATININE 0.72 05/11/2015 1600   CREATININE 0.6 04/09/2013 1511   GLUCOSE 93 05/11/2015 1600   GLUCOSE 87 04/09/2013 1511   CALCIUM 9.2  05/11/2015 1600   CALCIUM 9.0 04/09/2013 1511   AST 21 05/11/2015 1600   AST 19 08/23/2012 1552   ALT 19 05/11/2015 1600   ALT 18 08/23/2012 1552   ALKPHOS 31 (L) 05/11/2015 1600   ALKPHOS 34 (L) 08/23/2012 1552   BILITOT 0.6 05/11/2015 1600   BILITOT 0.4 08/23/2012 1552   PROT 8.3 (H) 05/11/2015 1600   PROT 7.8 08/23/2012 1552   ALBUMIN 4.4 05/11/2015 1600   ALBUMIN 4.0 08/23/2012 1552    Studies/Results: No results found.    Manmeet Arzola M 05/01/2016  Patient ID: Chip Boer, female   DOB: January 19, 1951, 65 y.o.   MRN: [id number]

## 2016-05-01 NOTE — Discharge Instructions (Signed)
[facility] ° °HERNIA REPAIR °POST OP INSTRUCTIONS ° °Always review your discharge instruction sheet given to you by the facility where your surgery was performed. ° °1. A  prescription for pain medication may be given to you upon discharge.  Take your pain medication as prescribed.  If narcotic pain medicine is not needed, then you may take acetaminophen (Tylenol) or ibuprofen (Advil) as needed. ° °2. Take your usually prescribed medications unless otherwise directed. ° °3. If you need a refill on your pain medication, please contact your pharmacy.  They will contact our office to request authorization. Prescriptions will not be filled after 5 pm daily or on weekends. ° °4. You should follow a light diet the first 24 hours after arrival home, such as soup and crackers or toast.  Be sure to include plenty of fluids daily.  Resume your normal diet the day after surgery. ° °5. Most patients will experience some swelling and bruising around the surgical site.  Ice packs and reclining will help.  Swelling and bruising can take several days to resolve.  ° °6. It is common to experience some constipation if taking pain medication after surgery.  Increasing fluid intake and taking a stool softener (such as Colace) will usually help or prevent this problem from occurring.  A mild laxative (Milk of Magnesia or Miralax) should be taken according to package directions if there are no bowel movements after 48 hours. ° °7. Unless discharge instructions indicate otherwise, you may remove your bandages 24-48 hours after surgery, and you may shower at that time.  You will have steri-strips (small skin tapes) in place directly over the incision.  These strips should be left on the skin for 5-7 days.  Any sutures or staples will be removed at the office during your follow-up visit. ° °8. ACTIVITIES:  You may resume regular (light) daily activities beginning the next day - such as daily self-care, walking, climbing  stairs - gradually increasing activities as tolerated.  You may have sexual intercourse when it is comfortable.  Refrain from any heavy lifting or straining until approved by your doctor.  You may drive when you are no longer taking prescription pain medication, you can comfortably wear a seatbelt, and you can safely maneuver your car and apply brakes. ° °9. You should see your doctor in the office for a follow-up appointment approximately 2-3 weeks after your surgery.  Make sure that you call for this appointment within a day or two after you arrive home to insure a convenient appointment time. °10.  ° °WHEN TO CALL YOUR DOCTOR: °1. Fever greater than 101.0 °2. Inability to urinate °3. Persistent nausea and/or vomiting °4. Extreme swelling or bruising °5. Continued bleeding from incision °6. Increased pain, redness, or drainage from the incision ° °The clinic staff is available to answer your questions during regular business hours.  Please don’t hesitate to call and ask to speak to one of the nurses for clinical concerns.  If you have a medical emergency, go to the nearest emergency room or call 911.  A surgeon from [facility] is always on call for the hospital. ° ° °[facility] °[address], [address], [place], [place]  [address] ° °[phone] ? [phone] ? FAX [phone] ° °[email or web address] ° °

## 2016-05-02 DIAGNOSIS — K432 Incisional hernia without obstruction or gangrene: Secondary | ICD-10-CM | POA: Diagnosis not present

## 2016-05-02 MED ORDER — TRAMADOL HCL 50 MG PO TABS: 50.0000 mg | ORAL_TABLET | Freq: Four times a day (QID) | ORAL | 0 refills | Status: DC | PRN

## 2016-05-02 NOTE — Care Management Note (Signed)
Case Management Note  Patient Details  Name: Linda Wang MRN: [id number] Date of Birth: Nov 30, 1950  Subjective/Objective:     Hernia repair               Action/Plan: Discharge Planning: AVS reviewed:  Chart reviewed. No NCM needs identified.   PCP Karleen Hampshire Md  Expected Discharge Date:  05/02/2016               Expected Discharge Plan:  Home/Self Care  In-House Referral:  NA  Discharge planning Services  CM Consult  Post Acute Care Choice:  NA Choice offered to:  NA  DME Arranged:  N/A DME Agency:  NA  HH Arranged:  NA HH Agency:  NA  Status of Service:  Completed, signed off  If discussed at [place] of Stay Meetings, dates discussed:    Additional Comments:  Erenest Rasher, RN 05/02/2016, 11:23 AM

## 2016-05-02 NOTE — Discharge Summary (Signed)
Physician Discharge Summary [facility]  Patient ID: Linda Wang MRN: [id number] DOB/AGE: Apr 10, 1951 65 y.o.  Admit date: 04/29/2016 Discharge date: 05/02/2016  Admission Diagnoses:  Ventral incisional hernia  Discharge Diagnoses:  Principal Problem:   Incisional hernia, without obstruction or gangrene Active Problems:   Ventral incisional hernia   Discharged Condition: good  Hospital Course: patient admitted after ventral incisional hernia repair with mesh.  Post op course uncomplicated.  Pain controlled.  Diet slowly advanced and tolerated.  Activity increased until ambulating independently.  Prepared for discharge on POD#3.  Consults: None  Treatments: surgery: lap ventral incisional hernia repair with mesh  Discharge Exam: Blood pressure 131/89, pulse (!) 103, temperature 98.8 F (37.1 C), temperature source Oral, resp. rate 18, height 5\' 2"  (1.575 m), weight 75.3 kg (166 lb), SpO2 96 %. Up ambulating in room independently. HEENT - clear Neck - soft Abd - binder on; dry; mild tenderness  Disposition: Home  Discharge Instructions    Diet - low sodium heart healthy    Complete by:  As directed    Discharge instructions    Complete by:  As directed    [facility], [place]  Always review your discharge instruction sheet given to you by the facility where your surgery was performed.  A  prescription for pain medication may be given to you upon discharge.  Take your pain medication as prescribed.  If narcotic pain medicine is not needed, then you may take acetaminophen (Tylenol) or ibuprofen (Advil) as needed.  Take your usually prescribed medications unless otherwise directed.  If you need a refill on your pain medication, please contact your pharmacy.  They will contact our office to request authorization. Prescriptions will not be filled after 5 pm daily or on weekends.  You should follow a light diet  the first 24 hours after arrival home, such as soup and crackers or toast.  Be sure to include plenty of fluids daily.  Resume your normal diet the day after surgery.  Most patients will experience some swelling and bruising around the surgical site.  Ice packs and reclining will help.  Swelling and bruising can take several days to resolve.   It is common to experience some constipation if taking pain medication after surgery.  Increasing fluid intake and taking a stool softener (such as Colace) will usually help or prevent this problem from occurring.  A mild laxative (Milk of Magnesia or Miralax) should be taken according to package directions if there are no bowel movements after 48 hours.  Unless discharge instructions indicate otherwise, you may remove your bandages 24-48 hours after surgery, and you may shower at that time.  You will have steri-strips (small skin tapes) in place directly over the incision.  These strips should be left on the skin for 5-7 days.  Any sutures or staples will be removed at the office during your follow-up visit.  ACTIVITIES:  You may resume regular (light) daily activities beginning the next day - such as daily self-care, walking, climbing stairs - gradually increasing activities as tolerated.  You may have sexual intercourse when it is comfortable.  Refrain from any heavy lifting or straining until approved by your doctor.  You may drive when you are no longer taking prescription pain medication, you can comfortably wear a seatbelt, and you can safely maneuver your car and apply brakes.  You should see your doctor in the office for a follow-up appointment  approximately 2-3 weeks after your surgery.  Make sure that you call for this appointment within a day or two after you arrive home to insure a convenient appointment time.   WHEN TO CALL YOUR DOCTOR: Fever greater than 101.0 Inability to urinate Persistent nausea and/or vomiting Extreme swelling or  bruising Continued bleeding from incision Increased pain, redness, or drainage from the incision  The clinic staff is available to answer your questions during regular business hours.  Please don't hesitate to call and ask to speak to one of the nurses for clinical concerns.  If you have a medical emergency, go to the nearest emergency room or call 911.  A surgeon from [facility] is always on call for the hospital.   [facility] [address], [place], [place], [place]  [address]  [phone] ? [phone] ? FAX [phone]  [email or web address]   Discharge wound care:    Complete by:  As directed    May remove gauze dressings today.  Leave Steristrips on for one week.  May begin showers today (Sunday 10/29).   Increase activity slowly    Complete by:  As directed        Medication List    TAKE these medications   ALPRAZolam 0.5 MG tablet Commonly known as:  XANAX Take 0.25 mg by mouth 3 (three) times daily as needed for anxiety. 0.25 mg in the morning before breakfast & 0.25 mg in the afternoon--scheduled.   BC HEADACHE POWDER PO Take 1 packet by mouth 3 (three) times daily as needed (for pain.).   cetirizine 10 MG tablet Commonly known as:  ZYRTEC Take 10 mg by mouth daily before breakfast.   cycloSPORINE 0.05 % ophthalmic emulsion Commonly known as:  RESTASIS Place 1 drop into the left eye 2 (two) times daily.   estradiol 1 MG tablet Commonly known as:  ESTRACE Take 1 mg by mouth daily before breakfast.   guaiFENesin 600 MG 12 hr tablet Commonly known as:  MUCINEX Take 600 mg by mouth 2 (two) times daily.   hydrocortisone 25 MG suppository Commonly known as:  ANUSOL-HC Place 25 mg rectally 2 (two) times daily as needed for hemorrhoids.   levothyroxine 75 MCG tablet Commonly known as:  SYNTHROID, LEVOTHROID Take 75 mcg by mouth daily before breakfast.   levothyroxine 88 MCG tablet Commonly known as:   SYNTHROID, LEVOTHROID Take 1 tablet (88 mcg total) by mouth daily.   PROVENTIL HFA 108 (90 Base) MCG/ACT inhaler Generic drug:  albuterol Inhale 2 puffs into the lungs 2 (two) times daily. Not using   traMADol 50 MG tablet Commonly known as:  ULTRAM Take 1 tablet (50 mg total) by mouth every 6 (six) hours as needed for moderate pain.      Follow-up Information    Dreyton Roessner M, MD. Schedule an appointment as soon as possible for a visit in 3 week(s).   Specialty:  General Surgery Contact information: [address] [place] [address] [phone]           Earnstine Regal, MD, [facility] Office: [phone]   Signed: Earnstine Regal 05/02/2016, 9:44 AM

## 2016-06-09 ENCOUNTER — Ambulatory Visit
Admission: RE | Admit: 2016-06-09 | Discharge: 2016-06-09 | Disposition: A | Discharge status: Home / Self Care | Payer: Medicare Other | Source: Ambulatory Visit | Attending: Obstetrics & Gynecology | Admitting: Obstetrics & Gynecology

## 2016-06-09 DIAGNOSIS — N63 Unspecified lump in unspecified breast: Secondary | ICD-10-CM

## 2016-06-18 ENCOUNTER — Ambulatory Visit (INDEPENDENT_AMBULATORY_CARE_PROVIDER_SITE_OTHER): Payer: Medicare Other | Admitting: Pulmonary Disease

## 2016-06-18 ENCOUNTER — Encounter: Payer: Self-pay | Admitting: Pulmonary Disease

## 2016-06-18 VITALS — BP 136/80 | HR 101 | Ht 62.5 in | Wt 164.2 lb

## 2016-06-18 DIAGNOSIS — R0689 Other abnormalities of breathing: Secondary | ICD-10-CM

## 2016-06-18 DIAGNOSIS — R0600 Dyspnea, unspecified: Secondary | ICD-10-CM | POA: Diagnosis not present

## 2016-06-18 NOTE — Patient Instructions (Signed)
We will schedule you  for pulmonary function tests. Return to clinic in 1-2 months to discuss results and further follow-up as needed.

## 2016-06-18 NOTE — Progress Notes (Signed)
SANTRESA BOHLINGER    [id number]    [birth year]  Primary Care Physician:FURR,SARA, MD  Referring Physician: Cleone Slim. Rozetta Nunnery, [id number] [place], [place] [address]  Chief complaint:  Consult for evaluation of dyspnea  HPI: Mrs. Linda Wang is 65 year old with past medical history of asthma, allergy. She was evaluated by Dr. Annamaria Boots in 2013 and an allergy profile was normal then. She had an episode of bronchitis in early December. She was seen at urgent care and treated with prednisone and antibiotic. She feels that this has helped with her symptoms greatly. But she still has daily symptoms of cough with mucus production, dyspnea, wheezing. His symptoms are exacerbated by hot water or hot showers. She had been on Symbicort at some point in the past, not on inhalers currently.  She is a never smoker. She is to work as a Scientist, water quality in [employer] and then has had precipitated she does not report any exposures.  Outpatient Encounter Prescriptions as of 06/18/2016  Medication Sig  . ALPRAZolam (XANAX) 0.5 MG tablet Take 0.25 mg by mouth 3 (three) times daily as needed for anxiety. 0.25 mg in the morning before breakfast & 0.25 mg in the afternoon--scheduled.  . Aspirin-Salicylamide-Caffeine (BC HEADACHE POWDER PO) Take 1 packet by mouth 3 (three) times daily as needed (for pain.).  Marland Kitchen cycloSPORINE (RESTASIS) 0.05 % ophthalmic emulsion Place 1 drop into the left eye 2 (two) times daily.   Marland Kitchen estradiol (ESTRACE) 1 MG tablet Take 1 mg by mouth daily before breakfast.   . guaiFENesin (MUCINEX) 600 MG 12 hr tablet Take 600 mg by mouth 2 (two) times daily.  Marland Kitchen levothyroxine (SYNTHROID, LEVOTHROID) 75 MCG tablet Take 75 mcg by mouth daily before breakfast.  . albuterol (PROVENTIL HFA) 108 (90 BASE) MCG/ACT inhaler Inhale 2 puffs into the lungs 2 (two) times daily. Not using  . hydrocortisone (ANUSOL-HC) 25 MG suppository Place 25 mg rectally 2 (two) times daily as needed for hemorrhoids.   .  [DISCONTINUED] cetirizine (ZYRTEC) 10 MG tablet Take 10 mg by mouth daily before breakfast.  . [DISCONTINUED] levothyroxine (SYNTHROID, LEVOTHROID) 88 MCG tablet Take 1 tablet (88 mcg total) by mouth daily. (Patient not taking: Reported on 06/18/2016)  . [DISCONTINUED] traMADol (ULTRAM) 50 MG tablet Take 1 tablet (50 mg total) by mouth every 6 (six) hours as needed for moderate pain. (Patient not taking: Reported on 06/18/2016)   No facility-administered encounter medications on file as of 06/18/2016.     Allergies as of 06/18/2016 - Review Complete 06/18/2016  Allergen Reaction Noted  . Morphine and related Other (See Comments) 05/26/2015  . Other  06/18/2016  . Statins  09/20/2011  . Sulfonamide derivatives Hives     Past Medical History:  Diagnosis Date  . Anxiety   . Arthritis   . Asthma    recent oral Prednisone use last dose 04-13-16  . DDD (degenerative disc disease), lumbar   . Depression   . Diverticulitis   . Diverticulosis of colon (without mention of hemorrhage)   . Dizziness    EVALUATED WITH MANY TESTS INCLUDING CT SCAN HEAD--NO PROBLEMS FOUND--PT STILL HAS OCCAS BOUT OF DIZZINESS.  Marland Kitchen GERD (gastroesophageal reflux disease)   . Hemorrhoids   . Hyperlipidemia   . Hypothyroidism   . 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 Thyroid cancer ([place])    Thyroid  . TMJ (temporomandibular joint syndrome)    BILATERAL- uses nightly mouthpiece.  . Tubular  adenoma of colon 01/20/2000    Past Surgical History:  Procedure Laterality Date  . ABDOMINAL HYSTERECTOMY    . APPENDECTOMY    . CESAREAN SECTION    . HEMORRHOID SURGERY     in office on 05/26/2015   . HEMORROIDECTOMY    . INSERTION OF MESH N/A 04/29/2016   Procedure: INSERTION OF MESH;  Surgeon: Armandina Gemma, MD;  Location: [facility];  Service: General;  Laterality: N/A;  . PARTIAL COLECTOMY N/A 10/12/2012   Procedure: SIGMOID COLECTOMY;  Surgeon: Earnstine Regal, MD;  Location: [facility];  Service: General;  Laterality: N/A;  .  [place]    . THYROIDECTOMY, PARTIAL     Positive cancer-no further issues  . TUBAL LIGATION    . VENTRAL HERNIA REPAIR N/A 04/29/2016   Procedure: LAPAROSCOPIC VENTRAL INCISIONAL  HERNIA;  Surgeon: Armandina Gemma, MD;  Location: [facility];  Service: General;  Laterality: N/A;    Family History  Problem Relation Age of Onset  . Thyroid cancer Mother   . Diabetes Mother   . Heart disease Mother   . Colon cancer Father   . Bladder Cancer Father   . Prostate cancer Brother   . Prostate cancer Brother   . Colonic polyp Brother   . Colon polyps Brother     Social History   Social History  . Marital status: Single    Spouse name: N/A  . Number of children: 1  . Years of education: N/A   Occupational History  . school cashier [place] History Main Topics  . Smoking status: Never Smoker  . Smokeless tobacco: Never Used  . Alcohol use No  . Drug use: No  . Sexual activity: Not Currently    Birth control/ protection: Surgical   Other Topics Concern  . Not on file   Social History Narrative  . No narrative on file   Review of systems: Review of Systems  Constitutional: Negative for fever and chills.  HENT: Negative.   Eyes: Negative for blurred vision.  Respiratory: as per HPI  Cardiovascular: Negative for chest pain and palpitations.  Gastrointestinal: Negative for vomiting, diarrhea, blood per rectum. Genitourinary: Negative for dysuria, urgency, frequency and hematuria.  Musculoskeletal: Negative for myalgias, back pain and joint pain.  Skin: Negative for itching and rash.  Neurological: Negative for dizziness, tremors, focal weakness, seizures and loss of consciousness.  Endo/Heme/Allergies: Negative for environmental allergies.  Psychiatric/Behavioral: Negative for depression, suicidal ideas and hallucinations.  All other systems reviewed and are negative.  Physical Exam: Blood pressure 136/80, pulse (!) 101, height 5' 2.5" (1.588 m),  weight 164 lb 3.2 oz (74.5 kg), SpO2 98 %. Gen:      No acute distress HEENT:  EOMI, sclera anicteric Neck:     No masses; no thyromegaly Lungs:    Clear to auscultation bilaterally; normal respiratory effort CV:         Regular rate and rhythm; no murmurs Abd:      + bowel sounds; soft, non-tender; no palpable masses, no distension Ext:    No edema; adequate peripheral perfusion Skin:      Warm and dry; no rash Neuro: alert and oriented x 3 Psych: normal mood and affect  Data Reviewed: Chest x-ray 05/31/06-no acute cardiopulmonary. Images reviewed.  Assessment:  Evaluation for dyspnea, wheezing. The symptoms appear to have been caused by an episode of bronchitis earlier this month. She is improved, almost back to baseline after antibiotics and prednisone.  She appears asymptomatic in office today and I don't feel there is a need to start inhalers. I'll evaluate her with pulmonary function tests. Follow up in clinic after tests to review and plan for further workup as needed.  Plan/Recommendations: - PFTs  Marshell Garfinkel MD [facility] Pager [phone] 06/18/2016, 11:25 AM  CC: Karleen Hampshire., MD

## 2016-08-30 ENCOUNTER — Ambulatory Visit: Payer: Medicare Other | Admitting: Pulmonary Disease

## 2017-03-22 ENCOUNTER — Other Ambulatory Visit (HOSPITAL_COMMUNITY): Payer: Self-pay | Admitting: Internal Medicine

## 2017-03-22 DIAGNOSIS — Z85850 Personal history of malignant neoplasm of thyroid: Secondary | ICD-10-CM

## 2017-03-24 ENCOUNTER — Ambulatory Visit (HOSPITAL_COMMUNITY)
Admission: RE | Admit: 2017-03-24 | Discharge: 2017-03-24 | Disposition: A | Discharge status: Home / Self Care | Payer: Medicare Other | Source: Ambulatory Visit | Attending: Internal Medicine | Admitting: Internal Medicine

## 2017-03-24 DIAGNOSIS — Z85850 Personal history of malignant neoplasm of thyroid: Secondary | ICD-10-CM | POA: Insufficient documentation

## 2017-03-24 DIAGNOSIS — E890 Postprocedural hypothyroidism: Secondary | ICD-10-CM | POA: Insufficient documentation

## 2017-03-24 DIAGNOSIS — Z9889 Other specified postprocedural states: Secondary | ICD-10-CM | POA: Diagnosis present

## 2017-05-17 ENCOUNTER — Other Ambulatory Visit: Payer: Self-pay | Admitting: Obstetrics & Gynecology

## 2017-05-17 DIAGNOSIS — Z1231 Encounter for screening mammogram for malignant neoplasm of breast: Secondary | ICD-10-CM

## 2017-06-01 ENCOUNTER — Ambulatory Visit: Payer: Medicare Other | Admitting: Internal Medicine

## 2017-06-14 ENCOUNTER — Ambulatory Visit
Admission: RE | Admit: 2017-06-14 | Discharge: 2017-06-14 | Disposition: A | Discharge status: Home / Self Care | Payer: Medicare Other | Source: Ambulatory Visit | Attending: Obstetrics & Gynecology | Admitting: Obstetrics & Gynecology

## 2017-06-14 DIAGNOSIS — Z1231 Encounter for screening mammogram for malignant neoplasm of breast: Secondary | ICD-10-CM

## 2017-12-05 ENCOUNTER — Encounter (HOSPITAL_BASED_OUTPATIENT_CLINIC_OR_DEPARTMENT_OTHER): Payer: Self-pay | Admitting: *Deleted

## 2017-12-05 ENCOUNTER — Emergency Department (HOSPITAL_COMMUNITY): Payer: Medicare Other

## 2017-12-05 ENCOUNTER — Emergency Department (HOSPITAL_BASED_OUTPATIENT_CLINIC_OR_DEPARTMENT_OTHER): Payer: Medicare Other

## 2017-12-05 ENCOUNTER — Other Ambulatory Visit: Payer: Self-pay

## 2017-12-05 ENCOUNTER — Emergency Department (HOSPITAL_BASED_OUTPATIENT_CLINIC_OR_DEPARTMENT_OTHER)
Admission: EM | Admit: 2017-12-05 | Discharge: 2017-12-05 | Disposition: A | Discharge status: Home / Self Care | Payer: Medicare Other | Attending: Emergency Medicine | Admitting: Emergency Medicine

## 2017-12-05 DIAGNOSIS — J45909 Unspecified asthma, uncomplicated: Secondary | ICD-10-CM | POA: Diagnosis not present

## 2017-12-05 DIAGNOSIS — I10 Essential (primary) hypertension: Secondary | ICD-10-CM | POA: Diagnosis not present

## 2017-12-05 DIAGNOSIS — Z79899 Other long term (current) drug therapy: Secondary | ICD-10-CM | POA: Insufficient documentation

## 2017-12-05 DIAGNOSIS — Z85850 Personal history of malignant neoplasm of thyroid: Secondary | ICD-10-CM | POA: Insufficient documentation

## 2017-12-05 DIAGNOSIS — E039 Hypothyroidism, unspecified: Secondary | ICD-10-CM | POA: Diagnosis not present

## 2017-12-05 DIAGNOSIS — K8050 Calculus of bile duct without cholangitis or cholecystitis without obstruction: Secondary | ICD-10-CM | POA: Insufficient documentation

## 2017-12-05 DIAGNOSIS — R1011 Right upper quadrant pain: Secondary | ICD-10-CM

## 2017-12-05 HISTORY — DX: Essential (primary) hypertension: I10

## 2017-12-05 LAB — CBC WITH DIFFERENTIAL/PLATELET
Basophils Absolute: 0 10*3/uL (ref 0.0–0.1)
Basophils Relative: 0 %
EOS PCT: 1 %
Eosinophils Absolute: 0.1 10*3/uL (ref 0.0–0.7)
HEMATOCRIT: 39 % (ref 36.0–46.0)
Hemoglobin: 12.9 g/dL (ref 12.0–15.0)
LYMPHS ABS: 1.1 10*3/uL (ref 0.7–4.0)
LYMPHS PCT: 18 %
MCH: 29.9 pg (ref 26.0–34.0)
MCHC: 33.1 g/dL (ref 30.0–36.0)
MCV: 90.5 fL (ref 78.0–100.0)
MONO ABS: 0.3 10*3/uL (ref 0.1–1.0)
Monocytes Relative: 5 %
NEUTROS ABS: 5 10*3/uL (ref 1.7–7.7)
Neutrophils Relative %: 76 %
PLATELETS: 252 10*3/uL (ref 150–400)
RBC: 4.31 MIL/uL (ref 3.87–5.11)
RDW: 12.3 % (ref 11.5–15.5)
WBC: 6.5 10*3/uL (ref 4.0–10.5)

## 2017-12-05 LAB — COMPREHENSIVE METABOLIC PANEL
ALT: 15 U/L (ref 14–54)
AST: 19 U/L (ref 15–41)
Albumin: 3.8 g/dL (ref 3.5–5.0)
Alkaline Phosphatase: 36 U/L — ABNORMAL LOW (ref 38–126)
Anion gap: 7 (ref 5–15)
BILIRUBIN TOTAL: 0.3 mg/dL (ref 0.3–1.2)
BUN: 14 mg/dL (ref 6–20)
CHLORIDE: 104 mmol/L (ref 101–111)
CO2: 24 mmol/L (ref 22–32)
CREATININE: 0.66 mg/dL (ref 0.44–1.00)
Calcium: 8.8 mg/dL — ABNORMAL LOW (ref 8.9–10.3)
Glucose, Bld: 126 mg/dL — ABNORMAL HIGH (ref 65–99)
POTASSIUM: 4.2 mmol/L (ref 3.5–5.1)
Sodium: 135 mmol/L (ref 135–145)
TOTAL PROTEIN: 7.5 g/dL (ref 6.5–8.1)

## 2017-12-05 LAB — URINALYSIS, ROUTINE W REFLEX MICROSCOPIC
Bilirubin Urine: NEGATIVE
GLUCOSE, UA: NEGATIVE mg/dL
HGB URINE DIPSTICK: NEGATIVE
Ketones, ur: NEGATIVE mg/dL
Leukocytes, UA: NEGATIVE
Nitrite: NEGATIVE
PH: 6 (ref 5.0–8.0)
Protein, ur: NEGATIVE mg/dL

## 2017-12-05 LAB — LIPASE, BLOOD: LIPASE: 26 U/L (ref 11–51)

## 2017-12-05 MED ORDER — FENTANYL CITRATE (PF) 100 MCG/2ML IJ SOLN
100.0000 ug | Freq: Once | INTRAMUSCULAR | Status: AC
  Administered 2017-12-05: 100 ug via INTRAVENOUS
  Filled 2017-12-05: qty 2

## 2017-12-05 MED ORDER — METOCLOPRAMIDE HCL 5 MG/ML IJ SOLN
10.0000 mg | Freq: Once | INTRAMUSCULAR | Status: AC
  Administered 2017-12-05: 10 mg via INTRAVENOUS
  Filled 2017-12-05: qty 2

## 2017-12-05 MED ORDER — IOPAMIDOL (ISOVUE-300) INJECTION 61%
100.0000 mL | Freq: Once | INTRAVENOUS | Status: AC | PRN
  Administered 2017-12-05: 100 mL via INTRAVENOUS

## 2017-12-05 MED ORDER — FENTANYL CITRATE (PF) 100 MCG/2ML IJ SOLN
50.0000 ug | Freq: Once | INTRAMUSCULAR | Status: AC
  Administered 2017-12-05: 50 ug via INTRAVENOUS
  Filled 2017-12-05: qty 2

## 2017-12-05 MED ORDER — CEFTRIAXONE SODIUM 2 G IJ SOLR
2.0000 g | Freq: Once | INTRAMUSCULAR | Status: AC
  Administered 2017-12-05: 2 g via INTRAVENOUS
  Filled 2017-12-05: qty 20

## 2017-12-05 MED ORDER — ONDANSETRON 8 MG PO TBDP: 8.0000 mg | ORAL_TABLET | Freq: Three times a day (TID) | ORAL | 0 refills | Status: DC | PRN

## 2017-12-05 MED ORDER — HYDROCODONE-ACETAMINOPHEN 5-325 MG PO TABS: 1.0000 | ORAL_TABLET | ORAL | 0 refills | Status: DC | PRN

## 2017-12-05 MED ORDER — ONDANSETRON HCL 4 MG/2ML IJ SOLN
4.0000 mg | Freq: Once | INTRAMUSCULAR | Status: AC
  Administered 2017-12-05: 4 mg via INTRAVENOUS
  Filled 2017-12-05: qty 2

## 2017-12-05 NOTE — ED Provider Notes (Signed)
[facility] DEPARTMENT Provider Note   CSN: [id number] Arrival date & time: 12/05/17  0145     History   Chief Complaint Chief Complaint  Patient presents with  . right upper abd pain    HPI Linda Wang is a 67 y.o. female.  HPI  This is a 67 year old female with a history of asthma, diverticulosis, hypertension, hyperlipidemia who presents with abdominal pain.  Onset of abdominal pain around midnight.  Patient reports she had difficulty going to sleep.  She reports sharp nonradiating right-sided pain.  Nothing seems to make it better or worse.  Currently her pain is 8 out of 10.  She has had pain that was "12 out of 10."  Took a Tylenol at midnight with minimal relief.  She reports nausea without vomiting.  She has not had a bowel movement in the last 24 hours.  She denies any fevers, chest pain, shortness of breath.  She has never had similar pain before.  She is unclear whether this is changed at all with eating.  She does have a history of appendectomy and ventral hernia repair.  Past Medical History:  Diagnosis Date  . Anxiety   . Arthritis   . Asthma    recent oral Prednisone use last dose 04-13-16  . DDD (degenerative disc disease), lumbar   . Depression   . Diverticulitis   . Diverticulosis of colon (without mention of hemorrhage)   . Dizziness    EVALUATED WITH MANY TESTS INCLUDING CT SCAN HEAD--NO PROBLEMS FOUND--PT STILL HAS OCCAS BOUT OF DIZZINESS.  Marland Kitchen GERD (gastroesophageal reflux disease)   . Hemorrhoids   . Hyperlipidemia   . Hypertension   . Hypothyroidism   . 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 Thyroid cancer ([place])    Thyroid  . TMJ (temporomandibular joint syndrome)    BILATERAL- uses nightly mouthpiece.  . Tubular adenoma of colon 01/20/2000    Patient Active Problem List   Diagnosis Date Noted  . Ventral incisional hernia 04/29/2016  . Incisional hernia, without obstruction or gangrene 04/28/2016  . Hemorrhoids, internal, with bleeding  11/22/2012  . Seasonal and perennial allergic rhinitis 05/13/2012  . History of thyroid cancer 09/20/2011  . REFLUX, ESOPHAGEAL 09/10/2009  . LUQ PAIN 08/26/2009  . ADENOMATOUS COLONIC POLYP 08/05/2009  . DIVERTICULOSIS OF COLON 08/05/2009  . Abdominal pain, generalized 08/05/2009  . HYPOTHYROIDISM 08/01/2009  . HYPERCHOLESTEROLEMIA 08/01/2009  . ANXIETY DEPRESSION 08/01/2009  . HYPERTENSION 08/01/2009    Past Surgical History:  Procedure Laterality Date  . ABDOMINAL HYSTERECTOMY    . APPENDECTOMY    . CESAREAN SECTION    . HEMORRHOID SURGERY     in office on 05/26/2015   . HEMORROIDECTOMY    . INSERTION OF MESH N/A 04/29/2016   Procedure: INSERTION OF MESH;  Surgeon: Armandina Gemma, MD;  Location: [facility];  Service: General;  Laterality: N/A;  . PARTIAL COLECTOMY N/A 10/12/2012   Procedure: SIGMOID COLECTOMY;  Surgeon: Earnstine Regal, MD;  Location: [facility];  Service: General;  Laterality: N/A;  . [place]    . THYROIDECTOMY, PARTIAL     Positive cancer-no further issues  . TUBAL LIGATION    . VENTRAL HERNIA REPAIR N/A 04/29/2016   Procedure: LAPAROSCOPIC VENTRAL INCISIONAL  HERNIA;  Surgeon: Armandina Gemma, MD;  Location: [facility];  Service: General;  Laterality: N/A;     OB History   None      Home Medications    Prior to Admission medications  Medication Sig Start Date End Date Taking? Authorizing Provider  carvedilol (COREG) 6.25 MG tablet Take 6.25 mg by mouth 2 (two) times daily with a meal.   Yes [provider]  albuterol (PROVENTIL HFA) 108 (90 BASE) MCG/ACT inhaler Inhale 2 puffs into the lungs 2 (two) times daily. Not using    [provider]  ALPRAZolam Duanne Moron) 0.5 MG tablet Take 0.25 mg by mouth 3 (three) times daily as needed for anxiety. 0.25 mg in the morning before breakfast & 0.25 mg in the afternoon--scheduled.    [provider]  Aspirin-Salicylamide-Caffeine (BC HEADACHE POWDER PO) Take 1 packet by mouth 3 (three) times  daily as needed (for pain.).    [provider]  cycloSPORINE (RESTASIS) 0.05 % ophthalmic emulsion Place 1 drop into the left eye 2 (two) times daily.     [provider]  estradiol (ESTRACE) 1 MG tablet Take 1 mg by mouth daily before breakfast.     [provider]  guaiFENesin (MUCINEX) 600 MG 12 hr tablet Take 600 mg by mouth 2 (two) times daily.    [provider]  hydrocortisone (ANUSOL-HC) 25 MG suppository Place 25 mg rectally 2 (two) times daily as needed for hemorrhoids.     [provider]  levothyroxine (SYNTHROID, LEVOTHROID) 75 MCG tablet Take 88 mcg by mouth daily before breakfast.     [provider]    Family History Family History  Problem Relation Age of Onset  . Thyroid cancer Mother   . Diabetes Mother   . Heart disease Mother   . Colon cancer Father   . Bladder Cancer Father   . Prostate cancer Brother   . Prostate cancer Brother   . Colonic polyp Brother   . Colon polyps Brother     Social History Social History   Tobacco Use  . Smoking status: Never Smoker  . Smokeless tobacco: Never Used  Substance Use Topics  . Alcohol use: No  . Drug use: No     Allergies   Morphine and related; Other; Statins; and Sulfonamide derivatives   Review of Systems Review of Systems  Constitutional: Positive for fever.  Respiratory: Negative for shortness of breath.   Cardiovascular: Negative for chest pain.  Gastrointestinal: Positive for abdominal pain and nausea. Negative for blood in stool, diarrhea and vomiting.  Genitourinary: Negative for dysuria.  All other systems reviewed and are negative.    Physical Exam Updated Vital Signs BP (!) 178/76 (BP Location: Left Arm)   Pulse 80   Temp 97.9 F (36.6 C) (Oral)   Resp 18   Ht 5' 2.5" (1.588 m)   Wt 78 kg (172 lb)   SpO2 98%   BMI 30.96 kg/m   Physical Exam  Constitutional: She is oriented to person, place, and time. She appears well-developed  and well-nourished.  Overweight, nontoxic-appearing  HENT:  Head: Normocephalic and atraumatic.  Eyes: Pupils are equal, round, and reactive to light.  Neck: Neck supple.  Cardiovascular: Normal rate, regular rhythm and normal heart sounds.  Pulmonary/Chest: Effort normal and breath sounds normal. No respiratory distress. She has no wheezes.  Abdominal: Soft. There is tenderness.  Epigastric and right upper quadrant tenderness to palpation, hyperactive bowel sounds, distention noted  Musculoskeletal: She exhibits no edema.  Neurological: She is alert and oriented to person, place, and time.  Skin: Skin is warm and dry.  Psychiatric: She has a normal mood and affect.  Nursing note and vitals reviewed.  ED Treatments / Results  Labs (all labs ordered are listed, but only abnormal results are displayed) Labs Reviewed  URINALYSIS, ROUTINE W REFLEX MICROSCOPIC - Abnormal; Notable for the following components:      Result Value   Specific Gravity, Urine >1.030 (*)    All other components within normal limits  COMPREHENSIVE METABOLIC PANEL - Abnormal; Notable for the following components:   Glucose, Bld 126 (*)    Calcium 8.8 (*)    Alkaline Phosphatase 36 (*)    All other components within normal limits  CBC WITH DIFFERENTIAL/PLATELET  LIPASE, BLOOD    EKG None  Radiology Ct Abdomen Pelvis W Contrast  Result Date: 12/05/2017 CLINICAL DATA:  Acute onset of right upper quadrant abdominal pain and vomiting. Nausea. Constipation. EXAM: CT ABDOMEN AND PELVIS WITH CONTRAST TECHNIQUE: Multidetector CT imaging of the abdomen and pelvis was performed using the standard protocol following bolus administration of intravenous contrast. CONTRAST:  148mL ISOVUE-300 IOPAMIDOL (ISOVUE-300) INJECTION 61% COMPARISON:  CT of the abdomen and pelvis from 04/10/2013 FINDINGS: Lower chest: The visualized lung bases are grossly clear. The visualized portions of the mediastinum are unremarkable.  Hepatobiliary: The liver is unremarkable in appearance. Minimal haziness about the gallbladder could reflect mild cholecystitis. The common bile duct remains normal in caliber. Pancreas: The pancreas is within normal limits. Spleen: The spleen is unremarkable in appearance. Adrenals/Urinary Tract: The adrenal glands are unremarkable in appearance. The kidneys are within normal limits. There is no evidence of hydronephrosis. No renal or ureteral stones are identified. No perinephric stranding is seen. Stomach/Bowel: The stomach is unremarkable in appearance. The small bowel is within normal limits. The patient is status post appendectomy. Scattered diverticulosis is noted along the transverse, descending and sigmoid colon, without evidence of diverticulitis. Vascular/Lymphatic: Scattered calcification is seen along the abdominal aorta and its branches. The abdominal aorta is otherwise grossly unremarkable. The inferior vena cava is grossly unremarkable. No retroperitoneal lymphadenopathy is seen. No pelvic sidewall lymphadenopathy is identified. Reproductive: The bladder is mildly distended and grossly unremarkable. The patient is status post hysterectomy. No suspicious adnexal masses are seen. Other: No additional soft tissue abnormalities are seen. Musculoskeletal: No acute osseous abnormalities are identified. A likely bone island is noted at the right side of the pubic symphysis. The visualized musculature is unremarkable in appearance. IMPRESSION: 1. Minimal haziness about the gallbladder could reflect mild cholecystitis. Would correlate with the patient's symptoms. 2. Scattered diverticulosis along the transverse, descending and sigmoid colon, without evidence of diverticulitis. Aortic Atherosclerosis (ICD10-I70.0). Electronically Signed   By: Garald Balding M.D.   On: 12/05/2017 04:57    Procedures Procedures (including critical care time)  Medications Ordered in ED Medications  ondansetron (ZOFRAN)  injection 4 mg (4 mg Intravenous Given 12/05/17 0501)  fentaNYL (SUBLIMAZE) injection 50 mcg (50 mcg Intravenous Given 12/05/17 0501)  iopamidol (ISOVUE-300) 61 % injection 100 mL (100 mLs Intravenous Contrast Given 12/05/17 0429)     Initial Impression / Assessment and Plan / ED Course  I have reviewed the triage vital signs and the nursing notes.  Pertinent labs & imaging results that were available during my care of the patient were reviewed by me and considered in my medical decision making (see chart for details).     Patient presents with mostly right upper quadrant abdominal pain.  Onset at midnight.  She is overall nontoxic-appearing vital signs are reassuring.  She has distention, hyperactive bowel sounds, and mostly right upper quadrant pain on exam.  Considerations include cholecystitis,  obstruction given prior surgeries, reflux, pancreatitis.  Patient was given pain and nausea medication.  Lab work-up is largely unremarkable including CBC, CMP, and lipase.  Urinalysis is reassuring.  CT scan obtained given concern for possible obstruction.  CT scan shows no evidence of obstruction.  There is some haziness around the gallbladder which could reflect cholecystitis.  She does have right upper quadrant pain and this would clinically correlate.  However, this is not definitive on CT imaging.  Feel that ultrasound would give better images.  Patient will be transferred to [facility] ED for ultrasound.  This was discussed with Dr. Mariane Baumgarten who has accepted the patient in transfer with her husband driving.  She will go by private vehicle and is clinically stable at time of transfer.  Final Clinical Impressions(s) / ED Diagnoses   Final diagnoses:  RUQ pain    ED Discharge Orders    None       Horton, Barbette Hair, MD 12/05/17 [phone]

## 2017-12-05 NOTE — ED Provider Notes (Signed)
US Abdomen Limited Ruq Result Date: 12/05/2017 CLINICAL DATA:  Upper abdominal pain EXAM: ULTRASOUND ABDOMEN LIMITED RIGHT UPPER QUADRANT COMPARISON:  CT abdomen and pelvis December 05, 2017 FINDINGS: Gallbladder: Within the gallbladder, there are echogenic foci which move and shadow consistent with cholelithiasis. Largest gallstone measures 1.1 cm in length. There is no gallbladder wall thickening or pericholecystic fluid. No sonographic Murphy sign noted by sonographer. Common bile duct: Diameter: 3 mm. No intrahepatic or extrahepatic biliary duct dilatation. Liver: No focal lesion identified. Within normal limits in parenchymal echogenicity. Portal vein is patent on color Doppler imaging with normal direction of blood flow towards the liver. IMPRESSION: Cholelithiasis.  Study otherwise unremarkable.  WBC 6.5  9:30 AM No right upper quadrant tenderness at this time.  Feels much better.  [facility] [place] [facility] follow-up for what appears to be biliary colic.  Standard return precautions given.  White blood cell count normal.  No pericholecystic fluid or gallbladder wall thickening.  Common bile duct is normal in size.  LFTs are normal.  Vital signs are stable.  Afebrile in the emergency department.       Jola Schmidt, MD 12/05/17 0930

## 2017-12-05 NOTE — ED Triage Notes (Signed)
C/o right upper abd pain that started around midnight. States she took a tylenol at midnight. C/o nausea. Vomiting times one. Denies any fevers or diarrhea.

## 2017-12-05 NOTE — ED Provider Notes (Signed)
Care from Dr Dina Rich at the [facility] ED. Ongoing RUQ pain and tenderness at this time. RUQ [place] pending. WBC normal. IV abx now for suspected cholecystitis. Pain and nausea treated     Linda Schmidt, MD 12/05/17 [phone]

## 2017-12-28 ENCOUNTER — Ambulatory Visit: Payer: Self-pay | Admitting: Surgery

## 2018-01-10 ENCOUNTER — Encounter (HOSPITAL_COMMUNITY): Payer: Self-pay

## 2018-01-10 NOTE — Patient Instructions (Signed)
Your procedure is scheduled on: Friday, January 13, 2018   Surgery Time:  10:00AM-11:30AM   Report to [facility] Main  Entrance    Report to admitting at 8:00 AM   Call this number if you have problems the morning of surgery [phone]   Do not eat food or drink liquids :After Midnight.   Do NOT smoke after Midnight   Take these medicines the morning of surgery with A SIP OF WATER: Carvedilol, Levothyroxine, Eye drops per normal routine, Xanax if needed   Bring Asthma Inhaler day of surgery                               You may not have any metal on your body including hair pins, jewelry, and body piercings             Do not wear make-up, lotions, powders, perfumes/cologne, or deodorant             Do not wear nail polish.  Do not shave  48 hours prior to surgery.               Do not bring valuables to the hospital. [place].   Contacts, dentures or bridgework may not be worn into surgery.   Leave suitcase in the car. After surgery it may be brought to your room.   Special Instructions: Bring a copy of your healthcare power of attorney and living will documents         the day of surgery if you haven't scanned them in before.              Please read over the following fact sheets you were given:  [facility] - Preparing for Surgery Before surgery, you can play an important role.  Because skin is not sterile, your skin needs to be as free of germs as possible.  You can reduce the number of germs on your skin by washing with CHG (chlorahexidine gluconate) soap before surgery.  CHG is an antiseptic cleaner which kills germs and bonds with the skin to continue killing germs even after washing. Please DO NOT use if you have an allergy to CHG or antibacterial soaps.  If your skin becomes reddened/irritated stop using the CHG and inform your nurse when you arrive at Short Stay. Do not shave (including legs and underarms)  for at least 48 hours prior to the first CHG shower.  You may shave your face/neck.  Please follow these instructions carefully:  1.  Shower with CHG Soap the night before surgery and the  morning of surgery.  2.  If you choose to wash your hair, wash your hair first as usual with your normal  shampoo.  3.  After you shampoo, rinse your hair and body thoroughly to remove the shampoo.                             4.  Use CHG as you would any other liquid soap.  You can apply chg directly to the skin and wash.  Gently with a scrungie or clean washcloth.  5.  Apply the CHG Soap to your body ONLY FROM THE NECK DOWN.   Do   not use on face/ open  Wound or open sores. Avoid contact with eyes, ears mouth and   genitals (private parts).                       Wash face,  Genitals (private parts) with your normal soap.             6.  Wash thoroughly, paying special attention to the area where your    surgery  will be performed.  7.  Thoroughly rinse your body with warm water from the neck down.  8.  DO NOT shower/wash with your normal soap after using and rinsing off the CHG Soap.                9.  Pat yourself dry with a clean towel.            10.  Wear clean pajamas.            11.  Place clean sheets on your bed the night of your first shower and do not  sleep with pets. Day of Surgery : Do not apply any lotions/deodorants the morning of surgery.  Please wear clean clothes to the hospital/surgery center.  FAILURE TO FOLLOW THESE INSTRUCTIONS MAY RESULT IN THE CANCELLATION OF YOUR SURGERY  PATIENT SIGNATURE_________________________________  NURSE SIGNATURE__________________________________  ________________________________________________________________________

## 2018-01-10 NOTE — Pre-Procedure Instructions (Signed)
The following are in the chart: EKG 10/19/2017 Last office visit note Dr. Mellody Drown  10/19/2017

## 2018-01-11 ENCOUNTER — Other Ambulatory Visit: Payer: Self-pay

## 2018-01-11 ENCOUNTER — Encounter (HOSPITAL_COMMUNITY)
Admission: RE | Admit: 2018-01-11 | Discharge: 2018-01-11 | Disposition: A | Discharge status: Home / Self Care | Payer: Medicare Other | Source: Ambulatory Visit | Attending: Surgery | Admitting: Surgery

## 2018-01-11 ENCOUNTER — Encounter (HOSPITAL_COMMUNITY): Payer: Self-pay

## 2018-01-11 ENCOUNTER — Ambulatory Visit (HOSPITAL_COMMUNITY)
Admission: RE | Admit: 2018-01-11 | Discharge: 2018-01-11 | Disposition: A | Discharge status: Home / Self Care | Payer: Medicare Other | Source: Ambulatory Visit | Attending: Anesthesiology | Admitting: Anesthesiology

## 2018-01-11 DIAGNOSIS — Z01818 Encounter for other preprocedural examination: Secondary | ICD-10-CM | POA: Insufficient documentation

## 2018-01-11 DIAGNOSIS — K8010 Calculus of gallbladder with chronic cholecystitis without obstruction: Secondary | ICD-10-CM | POA: Insufficient documentation

## 2018-01-11 DIAGNOSIS — Z01812 Encounter for preprocedural laboratory examination: Secondary | ICD-10-CM | POA: Diagnosis not present

## 2018-01-11 DIAGNOSIS — R918 Other nonspecific abnormal finding of lung field: Secondary | ICD-10-CM | POA: Diagnosis not present

## 2018-01-11 HISTORY — DX: Obesity, unspecified: E66.9

## 2018-01-11 HISTORY — DX: Calculus of gallbladder without cholecystitis without obstruction: K80.20

## 2018-01-11 HISTORY — DX: Personal history of other diseases of the respiratory system: Z87.09

## 2018-01-11 HISTORY — DX: Unspecified cataract: H26.9

## 2018-01-11 HISTORY — DX: Other seasonal allergic rhinitis: J30.2

## 2018-01-11 HISTORY — DX: Atherosclerosis of aorta: I70.0

## 2018-01-11 HISTORY — DX: Umbilical hernia without obstruction or gangrene: K42.9

## 2018-01-11 HISTORY — DX: Unspecified osteoarthritis, unspecified site: M19.90

## 2018-01-11 HISTORY — DX: Myositis, unspecified: M60.9

## 2018-01-11 LAB — BASIC METABOLIC PANEL
Anion gap: 8 (ref 5–15)
BUN: 13 mg/dL (ref 8–23)
CHLORIDE: 104 mmol/L (ref 98–111)
CO2: 27 mmol/L (ref 22–32)
CREATININE: 0.58 mg/dL (ref 0.44–1.00)
Calcium: 9.1 mg/dL (ref 8.9–10.3)
GFR calc Af Amer: >60 mL/min (ref >60)
GFR calc non Af Amer: >60 mL/min (ref >60)
GLUCOSE: 102 mg/dL — AB (ref 70–99)
POTASSIUM: 3.9 mmol/L (ref 3.5–5.1)
Sodium: 139 mmol/L (ref 135–145)

## 2018-01-11 LAB — CBC
HEMATOCRIT: 41.7 % (ref 36.0–46.0)
Hemoglobin: 13.8 g/dL (ref 12.0–15.0)
MCH: 29.9 pg (ref 26.0–34.0)
MCHC: 33.1 g/dL (ref 30.0–36.0)
MCV: 90.3 fL (ref 78.0–100.0)
PLATELETS: 266 10*3/uL (ref 150–400)
RBC: 4.62 MIL/uL (ref 3.87–5.11)
RDW: 12.3 % (ref 11.5–15.5)
WBC: 5.5 10*3/uL (ref 4.0–10.5)

## 2018-01-12 ENCOUNTER — Encounter (HOSPITAL_COMMUNITY): Payer: Self-pay | Admitting: Surgery

## 2018-01-12 DIAGNOSIS — K8010 Calculus of gallbladder with chronic cholecystitis without obstruction: Secondary | ICD-10-CM | POA: Diagnosis present

## 2018-01-12 NOTE — H&P (Signed)
General Surgery [facility]  Linda Wang DOB: 10/01/1950 Single / Language: English / Race: White Female   History of Present Illness   The patient is a 67 year old female who presents for evaluation of gall stones.  CHIEF COMPLAINT: biliary colic, cholelithiasis, cholecystitis  Patient is referred by her primary care physician, Dr. Dagoberto Reef, for surgical evaluation and management of symptomatic cholelithiasis. Patient is well-known to my surgical practice from her history of sigmoid colectomy, thyroid surgery, and incisional hernia repair. Patient has now had discrete episodes of right upper quadrant abdominal pain associated with nausea and vomiting. She was seen in the emergency department and underwent CT scan and ultrasound exam on December 05, 2017. These demonstrated evidence of mild cholecystitis and cholelithiasis. Patient is now referred for consideration for cholecystectomy. Patient does have a strong family history of gallbladder disease. Patient has had a long-standing history of intermittent upper abdominal symptoms associated with food. She has no history of hepatitis or pancreatitis. There is no history of jaundice or acholic stools.   Problem List/Past Medical VENTRAL INCISIONAL HERNIA WITHOUT OBSTRUCTION OR GANGRENE (K43.2)  INTERNAL BLEEDING HEMORRHOIDS (K64.8)  HISTORY OF INCISIONAL HERNIA REPAIR (Z98.890)  CHOLELITHIASIS WITH CHRONIC CHOLECYSTITIS (K80.10)  ABDOMINAL PAIN, ACUTE (R10.9)   Past Surgical History Appendectomy  Cesarean Section - 1  Hysterectomy (not due to cancer) - Complete  Resection of Small Bowel  Thyroid Surgery  Ventral / Umbilical Hernia Surgery  Right.  Diagnostic Studies History Colonoscopy  1-5 years ago Mammogram  within last year Pap Smear  1-5 years ago  Allergies Morphine Sulfate (Concentrate) *ANALGESICS - OPIOID*  Statins  Sulfonamide Derivatives  Hives. Allergies Reconciled    Medication History Carvedilol (6.25MG  Tablet, Oral) Active. Synthroid (75MCG Tablet, Oral) Active. ClonazePAM (0.5MG  Tablet, Oral) Active. Estradiol (1MG  Tablet, Oral) Active. Medications Reconciled  Social History Alcohol use  Occasional alcohol use. Caffeine use  Carbonated beverages, Coffee, Tea. No drug use  Tobacco use  Never smoker.  Family History Arthritis  Brother, Sister. Cancer  Brother, Father. Cerebrovascular Accident  Father. Prostate Cancer  Brother. Thyroid problems  Family Members In General, Mother.  Pregnancy / Birth History Age at menarche  62 years. Gravida  1 Maternal age  70-25 Para  1  Other Problems Anxiety Disorder  Arthritis  Asthma  Back Pain  Diverticulosis  Hemorrhoids  Hypercholesterolemia  Thyroid Disease   Vitals Weight: 169.2 lb Height: 62.5in Body Surface Area: 1.79 m Body Mass Index: 30.45 kg/m  Temp.: 97.80F  Pulse: 88 (Regular)  BP: 126/82 (Sitting, Left Arm, Standard)   Physical Exam  See vital signs recorded above  GENERAL APPEARANCE Development: normal Nutritional status: normal Gross deformities: none  SKIN Rash, lesions, ulcers: none Induration, erythema: none Nodules: none palpable  EYES Conjunctiva and lids: normal Pupils: equal and reactive Iris: normal bilaterally  EARS, NOSE, MOUTH, THROAT External ears: no lesion or deformity External nose: no lesion or deformity Hearing: grossly normal Lips: no lesion or deformity Dentition: normal for age Oral mucosa: moist  NECK Symmetric: yes Trachea: midline Thyroid: no palpable nodules in the thyroid bed; well-healed anterior cervical incision  CHEST Respiratory effort: normal Retraction or accessory muscle use: no Breath sounds: normal bilaterally Rales, rhonchi, wheeze: none  CARDIOVASCULAR Auscultation: regular rhythm, normal rate Murmurs: none Pulses: carotid and radial pulse 2+ palpable Lower  extremity edema: none Lower extremity varicosities: none  ABDOMEN Distension: none Masses: none palpable Tenderness: Mild tenderness right upper quadrant without palpable mass Hepatosplenomegaly:  not present Hernia: not present Well-healed midline abdominal incision beginning above the level of the umbilicus and extending to nearly the pubis. With Valsalva there is no sign of recurrent hernia. Laparoscopic port incision sites around the abdominal wall.  MUSCULOSKELETAL Station and gait: normal Digits and nails: no clubbing or cyanosis Muscle strength: grossly normal all extremities Range of motion: grossly normal all extremities Deformity: none  LYMPHATIC Cervical: none palpable Supraclavicular: none palpable  PSYCHIATRIC Oriented to person, place, and time: yes Mood and affect: normal for situation Judgment and insight: appropriate for situation    Assessment & Plan  ABDOMINAL PAIN, ACUTE (R10.9)  CHOLELITHIASIS WITH CHRONIC CHOLECYSTITIS (K80.10)  Patient returns to my practice with new onset of intermittent right upper quadrant abdominal pain associated with nausea and vomiting. Recent evaluation in the emergency department included CT scan and ultrasound examination confirming mild cholecystitis and cholelithiasis. Patient now presents to discuss cholecystectomy. She is provided with written literature on laparoscopic cholecystectomy to review at home.  Patient has a good clinical history for biliary colic. Diagnostic studies confirmed cholelithiasis with mild cholecystitis. Patient has had significant past abdominal surgery which may make it more difficult to perform laparoscopic technique for cholecystectomy. We discussed laparoscopic surgery versus open surgery. We discussed the hospital stay to be anticipated. We discussed performing intraoperative cholangiography. We discussed her postoperative recovery and return to activities. Patient understands and wishes  to proceed with surgery in the near future.  The risks and benefits of the procedure have been discussed at length with the patient. The patient understands the proposed procedure, potential alternative treatments, and the course of recovery to be expected. All of the patient's questions have been answered at this time. The patient wishes to proceed with surgery.   Armandina Gemma, [facility] Office: [phone]

## 2018-01-13 ENCOUNTER — Ambulatory Visit (HOSPITAL_COMMUNITY): Payer: Medicare Other | Admitting: Anesthesiology

## 2018-01-13 ENCOUNTER — Encounter (HOSPITAL_COMMUNITY): Payer: Self-pay | Admitting: *Deleted

## 2018-01-13 ENCOUNTER — Other Ambulatory Visit: Payer: Self-pay

## 2018-01-13 ENCOUNTER — Ambulatory Visit (HOSPITAL_COMMUNITY): Payer: Medicare Other

## 2018-01-13 ENCOUNTER — Encounter (HOSPITAL_COMMUNITY)
Admission: RE | Disposition: A | Discharge status: Home / Self Care | Payer: Self-pay | Source: Ambulatory Visit | Attending: Surgery

## 2018-01-13 ENCOUNTER — Observation Stay (HOSPITAL_COMMUNITY)
Admission: RE | Admit: 2018-01-13 | Discharge: 2018-01-14 | Disposition: A | Discharge status: Home / Self Care | Payer: Medicare Other | Source: Ambulatory Visit | Attending: Surgery | Admitting: Surgery

## 2018-01-13 DIAGNOSIS — K219 Gastro-esophageal reflux disease without esophagitis: Secondary | ICD-10-CM | POA: Diagnosis not present

## 2018-01-13 DIAGNOSIS — Z79899 Other long term (current) drug therapy: Secondary | ICD-10-CM | POA: Insufficient documentation

## 2018-01-13 DIAGNOSIS — E785 Hyperlipidemia, unspecified: Secondary | ICD-10-CM | POA: Diagnosis not present

## 2018-01-13 DIAGNOSIS — Z85850 Personal history of malignant neoplasm of thyroid: Secondary | ICD-10-CM | POA: Insufficient documentation

## 2018-01-13 DIAGNOSIS — I10 Essential (primary) hypertension: Secondary | ICD-10-CM | POA: Insufficient documentation

## 2018-01-13 DIAGNOSIS — M609 Myositis, unspecified: Secondary | ICD-10-CM | POA: Insufficient documentation

## 2018-01-13 DIAGNOSIS — F419 Anxiety disorder, unspecified: Secondary | ICD-10-CM | POA: Diagnosis not present

## 2018-01-13 DIAGNOSIS — K8010 Calculus of gallbladder with chronic cholecystitis without obstruction: Principal | ICD-10-CM | POA: Diagnosis present

## 2018-01-13 DIAGNOSIS — E039 Hypothyroidism, unspecified: Secondary | ICD-10-CM | POA: Insufficient documentation

## 2018-01-13 DIAGNOSIS — Z7982 Long term (current) use of aspirin: Secondary | ICD-10-CM | POA: Insufficient documentation

## 2018-01-13 DIAGNOSIS — F329 Major depressive disorder, single episode, unspecified: Secondary | ICD-10-CM | POA: Diagnosis not present

## 2018-01-13 DIAGNOSIS — Z885 Allergy status to narcotic agent status: Secondary | ICD-10-CM | POA: Insufficient documentation

## 2018-01-13 DIAGNOSIS — Z882 Allergy status to sulfonamides status: Secondary | ICD-10-CM | POA: Insufficient documentation

## 2018-01-13 DIAGNOSIS — J45909 Unspecified asthma, uncomplicated: Secondary | ICD-10-CM | POA: Insufficient documentation

## 2018-01-13 DIAGNOSIS — Z419 Encounter for procedure for purposes other than remedying health state, unspecified: Secondary | ICD-10-CM

## 2018-01-13 DIAGNOSIS — Z888 Allergy status to other drugs, medicaments and biological substances status: Secondary | ICD-10-CM | POA: Insufficient documentation

## 2018-01-13 HISTORY — PX: CHOLECYSTECTOMY: SHX55

## 2018-01-13 SURGERY — LAPAROSCOPIC CHOLECYSTECTOMY WITH INTRAOPERATIVE CHOLANGIOGRAM
Anesthesia: General | Site: Abdomen

## 2018-01-13 MED ORDER — ACETAMINOPHEN 325 MG PO TABS
650.0000 mg | ORAL_TABLET | Freq: Four times a day (QID) | ORAL | Status: DC | PRN
  Administered 2018-01-13: 650 mg via ORAL
  Filled 2018-01-13: qty 2

## 2018-01-13 MED ORDER — 0.9 % SODIUM CHLORIDE (POUR BTL) OPTIME
TOPICAL | Status: DC | PRN
  Administered 2018-01-13: 1000 mL

## 2018-01-13 MED ORDER — SODIUM CHLORIDE 0.9 % IV SOLN: INTRAVENOUS | Status: DC | PRN

## 2018-01-13 MED ORDER — LACTATED RINGERS IV SOLN
INTRAVENOUS | Status: DC
  Administered 2018-01-13 (×2): via INTRAVENOUS

## 2018-01-13 MED ORDER — SODIUM CHLORIDE 0.9 % IV SOLN
INTRAVENOUS | Status: DC | PRN
  Administered 2018-01-13: 25 ug/min via INTRAVENOUS

## 2018-01-13 MED ORDER — HYDROMORPHONE HCL 1 MG/ML IJ SOLN
1.0000 mg | INTRAMUSCULAR | Status: DC | PRN
  Filled 2018-01-13: qty 1

## 2018-01-13 MED ORDER — DEXAMETHASONE SODIUM PHOSPHATE 10 MG/ML IJ SOLN
INTRAMUSCULAR | Status: DC | PRN
  Administered 2018-01-13: 10 mg via INTRAVENOUS

## 2018-01-13 MED ORDER — PROPOFOL 10 MG/ML IV BOLUS
INTRAVENOUS | Status: AC
  Filled 2018-01-13: qty 20

## 2018-01-13 MED ORDER — TRAMADOL HCL 50 MG PO TABS: 50.0000 mg | ORAL_TABLET | Freq: Four times a day (QID) | ORAL | 0 refills | Status: DC | PRN

## 2018-01-13 MED ORDER — ALBUTEROL SULFATE (2.5 MG/3ML) 0.083% IN NEBU: 3.0000 mL | INHALATION_SOLUTION | Freq: Two times a day (BID) | RESPIRATORY_TRACT | Status: DC

## 2018-01-13 MED ORDER — EPHEDRINE 5 MG/ML INJ
INTRAVENOUS | Status: AC
  Filled 2018-01-13: qty 10

## 2018-01-13 MED ORDER — PHENYLEPHRINE 40 MCG/ML (10ML) SYRINGE FOR IV PUSH (FOR BLOOD PRESSURE SUPPORT)
PREFILLED_SYRINGE | INTRAVENOUS | Status: DC | PRN
  Administered 2018-01-13: 80 ug via INTRAVENOUS
  Administered 2018-01-13: 40 ug via INTRAVENOUS

## 2018-01-13 MED ORDER — CEFAZOLIN SODIUM-DEXTROSE 2-4 GM/100ML-% IV SOLN
2.0000 g | INTRAVENOUS | Status: AC
  Administered 2018-01-13: 2 g via INTRAVENOUS
  Filled 2018-01-13: qty 100

## 2018-01-13 MED ORDER — CHLORHEXIDINE GLUCONATE CLOTH 2 % EX PADS: 6.0000 | MEDICATED_PAD | Freq: Once | CUTANEOUS | Status: DC

## 2018-01-13 MED ORDER — LACTATED RINGERS IR SOLN
Status: DC | PRN
  Administered 2018-01-13: 1000 mL

## 2018-01-13 MED ORDER — ONDANSETRON 4 MG PO TBDP: 4.0000 mg | ORAL_TABLET | Freq: Four times a day (QID) | ORAL | Status: DC | PRN

## 2018-01-13 MED ORDER — LEVOTHYROXINE SODIUM 88 MCG PO TABS
88.0000 ug | ORAL_TABLET | Freq: Every day | ORAL | Status: DC
  Administered 2018-01-14: 88 ug via ORAL
  Filled 2018-01-13: qty 1

## 2018-01-13 MED ORDER — LIDOCAINE 2% (20 MG/ML) 5 ML SYRINGE
INTRAMUSCULAR | Status: AC
  Filled 2018-01-13: qty 5

## 2018-01-13 MED ORDER — HYDROMORPHONE HCL 1 MG/ML IJ SOLN
0.2500 mg | INTRAMUSCULAR | Status: DC | PRN
  Administered 2018-01-13 (×4): 0.5 mg via INTRAVENOUS

## 2018-01-13 MED ORDER — LIDOCAINE HCL (CARDIAC) PF 100 MG/5ML IV SOSY
PREFILLED_SYRINGE | INTRAVENOUS | Status: DC | PRN
  Administered 2018-01-13: 100 mg via INTRAVENOUS

## 2018-01-13 MED ORDER — FENTANYL CITRATE (PF) 250 MCG/5ML IJ SOLN
INTRAMUSCULAR | Status: AC
  Filled 2018-01-13: qty 5

## 2018-01-13 MED ORDER — ONDANSETRON HCL 4 MG/2ML IJ SOLN: 4.0000 mg | Freq: Four times a day (QID) | INTRAMUSCULAR | Status: DC | PRN

## 2018-01-13 MED ORDER — ACETAMINOPHEN 650 MG RE SUPP: 650.0000 mg | Freq: Four times a day (QID) | RECTAL | Status: DC | PRN

## 2018-01-13 MED ORDER — IOPAMIDOL (ISOVUE-300) INJECTION 61%
INTRAVENOUS | Status: AC
  Filled 2018-01-13: qty 50

## 2018-01-13 MED ORDER — SUGAMMADEX SODIUM 200 MG/2ML IV SOLN
INTRAVENOUS | Status: DC | PRN
  Administered 2018-01-13: 200 mg via INTRAVENOUS

## 2018-01-13 MED ORDER — PHENYLEPHRINE 40 MCG/ML (10ML) SYRINGE FOR IV PUSH (FOR BLOOD PRESSURE SUPPORT)
PREFILLED_SYRINGE | INTRAVENOUS | Status: AC
  Filled 2018-01-13: qty 10

## 2018-01-13 MED ORDER — HYDROMORPHONE HCL 1 MG/ML IJ SOLN
INTRAMUSCULAR | Status: AC
  Filled 2018-01-13: qty 2

## 2018-01-13 MED ORDER — KCL IN DEXTROSE-NACL 20-5-0.45 MEQ/L-%-% IV SOLN
INTRAVENOUS | Status: DC
Start: 2018-01-13 — End: 2018-01-14
  Administered 2018-01-13: 17:00:00 via INTRAVENOUS
  Filled 2018-01-13: qty 1000

## 2018-01-13 MED ORDER — DEXAMETHASONE SODIUM PHOSPHATE 10 MG/ML IJ SOLN
INTRAMUSCULAR | Status: AC
  Filled 2018-01-13: qty 1

## 2018-01-13 MED ORDER — MIDAZOLAM HCL 5 MG/5ML IJ SOLN
INTRAMUSCULAR | Status: DC | PRN
  Administered 2018-01-13: 2 mg via INTRAVENOUS

## 2018-01-13 MED ORDER — PROPOFOL 10 MG/ML IV BOLUS
INTRAVENOUS | Status: DC | PRN
  Administered 2018-01-13: 150 mg via INTRAVENOUS

## 2018-01-13 MED ORDER — FENTANYL CITRATE (PF) 100 MCG/2ML IJ SOLN
INTRAMUSCULAR | Status: DC | PRN
  Administered 2018-01-13 (×3): 50 ug via INTRAVENOUS

## 2018-01-13 MED ORDER — CARVEDILOL 6.25 MG PO TABS
6.2500 mg | ORAL_TABLET | Freq: Two times a day (BID) | ORAL | Status: DC
  Administered 2018-01-13 – 2018-01-14 (×2): 6.25 mg via ORAL
  Filled 2018-01-13 (×2): qty 1

## 2018-01-13 MED ORDER — ROCURONIUM BROMIDE 100 MG/10ML IV SOLN
INTRAVENOUS | Status: DC | PRN
  Administered 2018-01-13: 20 mg via INTRAVENOUS
  Administered 2018-01-13: 30 mg via INTRAVENOUS

## 2018-01-13 MED ORDER — PROMETHAZINE HCL 25 MG/ML IJ SOLN: 6.2500 mg | INTRAMUSCULAR | Status: DC | PRN

## 2018-01-13 MED ORDER — MEPERIDINE HCL 50 MG/ML IJ SOLN: 6.2500 mg | INTRAMUSCULAR | Status: DC | PRN

## 2018-01-13 MED ORDER — BUPIVACAINE-EPINEPHRINE (PF) 0.25% -1:200000 IJ SOLN
INTRAMUSCULAR | Status: DC | PRN
  Administered 2018-01-13: 20 mL via PERINEURAL

## 2018-01-13 MED ORDER — EPHEDRINE SULFATE-NACL 50-0.9 MG/10ML-% IV SOSY
PREFILLED_SYRINGE | INTRAVENOUS | Status: DC | PRN
  Administered 2018-01-13: 5 mg via INTRAVENOUS

## 2018-01-13 MED ORDER — ESTRADIOL 1 MG PO TABS
1.0000 mg | ORAL_TABLET | Freq: Every day | ORAL | Status: DC
  Administered 2018-01-14: 1 mg via ORAL
  Filled 2018-01-13: qty 1

## 2018-01-13 MED ORDER — HYDROCODONE-ACETAMINOPHEN 5-325 MG PO TABS
1.0000 | ORAL_TABLET | ORAL | Status: DC | PRN
  Administered 2018-01-13: 1 via ORAL
  Filled 2018-01-13: qty 2

## 2018-01-13 MED ORDER — ONDANSETRON HCL 4 MG/2ML IJ SOLN
INTRAMUSCULAR | Status: DC | PRN
  Administered 2018-01-13: 4 mg via INTRAVENOUS

## 2018-01-13 MED ORDER — ONDANSETRON HCL 4 MG/2ML IJ SOLN
INTRAMUSCULAR | Status: AC
  Filled 2018-01-13: qty 2

## 2018-01-13 MED ORDER — MIDAZOLAM HCL 2 MG/2ML IJ SOLN
INTRAMUSCULAR | Status: AC
  Filled 2018-01-13: qty 2

## 2018-01-13 MED ORDER — SUGAMMADEX SODIUM 200 MG/2ML IV SOLN
INTRAVENOUS | Status: AC
  Filled 2018-01-13: qty 2

## 2018-01-13 MED ORDER — TRAMADOL HCL 50 MG PO TABS
50.0000 mg | ORAL_TABLET | Freq: Four times a day (QID) | ORAL | Status: DC | PRN
  Administered 2018-01-13: 50 mg via ORAL
  Filled 2018-01-13: qty 1

## 2018-01-13 MED ORDER — IOPAMIDOL (ISOVUE-300) INJECTION 61%
INTRAVENOUS | Status: DC | PRN
  Administered 2018-01-13: 5 mL via INTRAVENOUS

## 2018-01-13 MED ORDER — BUPIVACAINE-EPINEPHRINE (PF) 0.25% -1:200000 IJ SOLN
INTRAMUSCULAR | Status: AC
  Filled 2018-01-13: qty 30

## 2018-01-13 MED ORDER — KETOROLAC TROMETHAMINE 30 MG/ML IJ SOLN: 30.0000 mg | Freq: Once | INTRAMUSCULAR | Status: DC | PRN

## 2018-01-13 MED ORDER — ALPRAZOLAM 0.25 MG PO TABS
0.2500 mg | ORAL_TABLET | Freq: Three times a day (TID) | ORAL | Status: DC | PRN
Start: 2018-01-13 — End: 2018-01-14
  Administered 2018-01-13 – 2018-01-14 (×2): 0.25 mg via ORAL
  Filled 2018-01-13 (×2): qty 1

## 2018-01-13 SURGICAL SUPPLY — 33 items
APPLIER CLIP ROT 10 11.4 M/L (STAPLE) ×3
BAG SPEC RTRVL LRG 6X4 10 (ENDOMECHANICALS) ×1
CABLE HIGH FREQUENCY MONO STRZ (ELECTRODE) ×3 IMPLANT
CHLORAPREP W/TINT 26ML (MISCELLANEOUS) ×3 IMPLANT
CLIP APPLIE ROT 10 11.4 M/L (STAPLE) ×1 IMPLANT
CLOSURE WOUND 1/2 X4 (GAUZE/BANDAGES/DRESSINGS) ×1
COVER MAYO STAND STRL (DRAPES) ×3 IMPLANT
COVER SURGICAL LIGHT HANDLE (MISCELLANEOUS) ×3 IMPLANT
DECANTER SPIKE VIAL GLASS SM (MISCELLANEOUS) ×3 IMPLANT
DRAPE C-ARM 42X120 X-RAY (DRAPES) ×3 IMPLANT
ELECT REM PT RETURN 15FT ADLT (MISCELLANEOUS) ×3 IMPLANT
GAUZE SPONGE 2X2 8PLY STRL LF (GAUZE/BANDAGES/DRESSINGS) ×1 IMPLANT
GLOVE SURG ORTHO 8.0 STRL STRW (GLOVE) ×3 IMPLANT
GOWN STRL REUS W/TWL XL LVL3 (GOWN DISPOSABLE) ×9 IMPLANT
HEMOSTAT SURGICEL 4X8 (HEMOSTASIS) IMPLANT
KIT BASIN OR (CUSTOM PROCEDURE TRAY) ×3 IMPLANT
POUCH SPECIMEN RETRIEVAL 10MM (ENDOMECHANICALS) ×3 IMPLANT
SCISSORS LAP 5X35 DISP (ENDOMECHANICALS) ×3 IMPLANT
SET CHOLANGIOGRAPH MIX (MISCELLANEOUS) ×3 IMPLANT
SET IRRIG TUBING LAPAROSCOPIC (IRRIGATION / IRRIGATOR) ×3 IMPLANT
SLEEVE XCEL OPT CAN 5 100 (ENDOMECHANICALS) ×6 IMPLANT
SPONGE GAUZE 2X2 STER 10/PKG (GAUZE/BANDAGES/DRESSINGS) ×2
STRIP CLOSURE SKIN 1/2X4 (GAUZE/BANDAGES/DRESSINGS) ×2 IMPLANT
SUT MNCRL AB 4-0 PS2 18 (SUTURE) ×3 IMPLANT
TAPE CLOTH SURG 4X10 WHT LF (GAUZE/BANDAGES/DRESSINGS) ×3 IMPLANT
TOWEL OR 17X26 10 PK STRL BLUE (TOWEL DISPOSABLE) ×3 IMPLANT
TOWEL OR NON WOVEN STRL DISP B (DISPOSABLE) ×3 IMPLANT
TRAY LAPAROSCOPIC (CUSTOM PROCEDURE TRAY) ×3 IMPLANT
TROCAR BLADELESS OPT 5 100 (ENDOMECHANICALS) ×3 IMPLANT
TROCAR XCEL BLUNT TIP 100MML (ENDOMECHANICALS) ×3 IMPLANT
TROCAR XCEL NON-BLD 11X100MML (ENDOMECHANICALS) ×3 IMPLANT
TROCAR XCEL UNIV SLVE 11M 100M (ENDOMECHANICALS) ×3 IMPLANT
TUBING INSUF HEATED (TUBING) IMPLANT

## 2018-01-13 NOTE — Anesthesia Postprocedure Evaluation (Signed)
Anesthesia Post Note  Patient: Linda Wang  Procedure(s) Performed: LAPAROSCOPIC CHOLECYSTECTOMY WITH INTRAOPERATIVE CHOLANGIOGRAM (N/A Abdomen)     Patient location during evaluation: PACU Anesthesia Type: General Level of consciousness: awake Pain management: pain level controlled Vital Signs Assessment: post-procedure vital signs reviewed and stable Respiratory status: spontaneous breathing Cardiovascular status: stable Postop Assessment: no apparent nausea or vomiting Anesthetic complications: no    Last Vitals:  Vitals:   01/13/18 1355 01/13/18 1500  BP: 126/78 131/78  Pulse: 84 88  Resp: 16 16  Temp: 36.9 C 37 C  SpO2: 93% 95%    Last Pain:  Vitals:   01/13/18 1500  TempSrc: Oral  PainSc:    Pain Goal: Patients Stated Pain Goal: 3 (01/13/18 0846)               Maheen Cwikla JR,JOHN [facility]

## 2018-01-13 NOTE — Anesthesia Procedure Notes (Signed)
Procedure Name: Intubation Date/Time: 01/13/2018 10:19 AM Performed by: Kyung Rudd, CRNA Pre-anesthesia Checklist: Patient identified, Emergency Drugs available, Suction available and Patient being monitored Patient Re-evaluated:Patient Re-evaluated prior to induction Oxygen Delivery Method: Circle system utilized Preoxygenation: Pre-oxygenation with 100% oxygen Induction Type: IV induction Ventilation: Mask ventilation without difficulty Laryngoscope Size: Mac and 3 Grade View: Grade II Tube type: Oral Tube size: 7.0 mm Number of attempts: 1 Airway Equipment and Method: Stylet Placement Confirmation: ETT inserted through vocal cords under direct vision,  positive ETCO2 and breath sounds checked- equal and bilateral Secured at: 21 cm Tube secured with: Tape Dental Injury: Teeth and Oropharynx as per pre-operative assessment

## 2018-01-13 NOTE — Transfer of Care (Signed)
Immediate Anesthesia Transfer of Care Note  Patient: Linda Wang  Procedure(s) Performed: LAPAROSCOPIC CHOLECYSTECTOMY WITH INTRAOPERATIVE CHOLANGIOGRAM (N/A Abdomen)  Patient Location: PACU  Anesthesia Type:General  Level of Consciousness: awake, alert  and oriented  Airway & Oxygen Therapy: Patient Spontanous Breathing and Patient connected to face mask oxygen  Post-op Assessment: Report given to RN, Post -op Vital signs reviewed and stable and Patient moving all extremities X 4  Post vital signs: Reviewed and stable  Last Vitals:  Vitals Value Taken Time  BP 149/88 01/13/2018 11:45 AM  Temp    Pulse 87 01/13/2018 11:47 AM  Resp 12 01/13/2018 11:47 AM  SpO2 100 % 01/13/2018 11:47 AM  Vitals shown include unvalidated device data.  Last Pain:  Vitals:   01/13/18 0832  TempSrc: Oral      Patients Stated Pain Goal: 3 (40/37/09 6438)  Complications: No apparent anesthesia complications

## 2018-01-13 NOTE — Interval H&P Note (Signed)
History and Physical Interval Note:  01/13/2018 9:58 AM  Linda Wang  has presented today for surgery, with the diagnosis of CHRONIC CHOLECYSTITIS.  The various methods of treatment have been discussed with the patient and family. After consideration of risks, benefits and other options for treatment, the patient has consented to    Procedure(s): LAPAROSCOPIC CHOLECYSTECTOMY WITH INTRAOPERATIVE CHOLANGIOGRAM (N/A) as a surgical intervention .    The patient's history has been reviewed, patient examined, no change in status, stable for surgery.  I have reviewed the patient's chart and labs.  Questions were answered to the patient's satisfaction.    Armandina Gemma, [facility] Office: [place]

## 2018-01-13 NOTE — Op Note (Signed)
Procedure Note  Pre-operative Diagnosis:  Chronic cholecystitis, cholelithiasis  Post-operative Diagnosis:  same  Surgeon:  Armandina Gemma, MD  Assistant:  Mohammed Kindle, RN   Procedure:  Laparoscopic cholecystectomy with intra-operative cholangiography  Anesthesia:  General  Estimated Blood Loss:  minimal  Drains: none         Specimen: Gallbladder to pathology  Indications:  Patient is referred by her primary care physician, Dr. Dagoberto Reef, for surgical evaluation and management of symptomatic cholelithiasis. Patient is well-known to my surgical practice from her history of sigmoid colectomy, thyroid surgery, and incisional hernia repair. Patient has now had discrete episodes of right upper quadrant abdominal pain associated with nausea and vomiting. She was seen in the emergency department and underwent CT scan and ultrasound exam on December 05, 2017. These demonstrated evidence of mild cholecystitis and cholelithiasis. Patient is now referred for consideration for cholecystectomy. Patient does have a strong family history of gallbladder disease. Patient has had a long-standing history of intermittent upper abdominal symptoms associated with food. She has no history of hepatitis or pancreatitis. There is no history of jaundice or acholic stools.  Procedure Details:  The patient was seen in the pre-op holding area. The risks, benefits, complications, treatment options, and expected outcomes were previously discussed with the patient. The patient agreed with the proposed plan and has signed the informed consent form.  The patient was brought to the Operating Room, identified as Linda Wang and the procedure verified as laparoscopic cholecystectomy with intraoperative cholangiography. A "time out" was completed and the above information confirmed.  The patient was placed in the supine position. Following induction of general anesthesia, the abdomen was prepped and draped in the usual  aseptic fashion.  An incision was made in the LUQ at the costal margin.  Using a 44mm Optiview trocar under direct vision, pneumoperitoneum was established with carbon dioxide. There were omental adhesions to the mesh from her previous incisional hernia repair.  Mesh appeared well approximated to the anterior abdominal wall and there was no sign of recurrent hernia.  Additional trocars were introduced under direct vision along the right costal margin in the midline, mid-clavicular line, and anterior axillary line. An additional trocar was placed in the midline of the upper abdomen just above the upper edge of the mesh.   The gallbladder was identified and the fundus grasped and retracted cephalad. Adhesions were taken down bluntly and the electrocautery was utilized as needed, taking care not to injure any adjacent structures. The infundibulum was grasped and retracted laterally, exposing the peritoneum overlying the triangle of Calot. The peritoneum was incised and structures exposed with blunt dissection. The cystic duct was clearly identified, bluntly dissected circumferentially, and clipped at the neck of the gallbladder.  An incision was made in the cystic duct and the cholangiogram catheter introduced. The catheter was secured using an ligaclip.  Real-time cholangiography was performed using C-arm fluoroscopy.  There was rapid filling of a normal caliber common bile duct.  There was reflux of contrast into the left and right hepatic ductal systems.  There was free flow distally into the duodenum without filling defect or obstruction.  The catheter was removed from the peritoneal cavity.  The cystic duct was then ligated with surgical clips and divided. The cystic artery was identified, dissected circumferentially, ligated with ligaclips, and divided.  The gallbladder was dissected away from the liver bed using the electrocautery for hemostasis. The gallbladder was completely removed from the liver and  placed into an  endocatch bag. The gallbladder was removed in the endocatch bag through the umbilical port site and submitted to pathology for review.  The right upper quadrant was irrigated and the gallbladder bed was inspected. Hemostasis was achieved with the electrocautery.  Pneumoperitoneum was released after viewing removal of the trocars with good hemostasis noted. The umbilical wound was irrigated and the fascia was then closed with the pursestring suture.  Local anesthetic was infiltrated at all port sites. The skin incisions were closed with 4-0 Monocril subcuticular sutures and steri-strips and dressings were applied.  Instrument, sponge, and needle counts were correct at the conclusion of the case.  The patient was awakened from anesthesia and brought to the recovery room in stable condition.  The patient tolerated the procedure well.   Armandina Gemma, MD [facility] Office: [phone]

## 2018-01-13 NOTE — Anesthesia Preprocedure Evaluation (Addendum)
Anesthesia Evaluation  Patient identified by MRN, date of birth, ID band Patient awake    Reviewed: Allergy & Precautions, NPO status , Patient's Chart, lab work & pertinent test results, reviewed documented beta blocker date and time   Airway Mallampati: II       Dental no notable dental hx. (+) Teeth Intact   Pulmonary    Pulmonary exam normal breath sounds clear to auscultation       Cardiovascular hypertension, Pt. on home beta blockers Normal cardiovascular exam Rhythm:Regular Rate:Normal     Neuro/Psych PSYCHIATRIC DISORDERS Anxiety Depression    GI/Hepatic Neg liver ROS,   Endo/Other  Hypothyroidism   Renal/GU negative Renal ROS  negative genitourinary   Musculoskeletal   Abdominal (+) + obese,   Peds  Hematology   Anesthesia Other Findings   Reproductive/Obstetrics                            Anesthesia Physical Anesthesia Plan  ASA: II  Anesthesia Plan: General   Post-op Pain Management:    Induction: Intravenous  PONV Risk Score and Plan: 4 or greater and Ondansetron, Dexamethasone and Midazolam  Airway Management Planned: Oral ETT  Additional Equipment:   Intra-op Plan:   Post-operative Plan: Extubation in OR  Informed Consent: I have reviewed the patients History and Physical, chart, labs and discussed the procedure including the risks, benefits and alternatives for the proposed anesthesia with the patient or authorized representative who has indicated his/her understanding and acceptance.   Dental advisory given  Plan Discussed with: CRNA  Anesthesia Plan Comments:         Anesthesia Quick Evaluation

## 2018-01-14 DIAGNOSIS — K8010 Calculus of gallbladder with chronic cholecystitis without obstruction: Secondary | ICD-10-CM | POA: Diagnosis not present

## 2018-01-14 MED ORDER — METHOCARBAMOL 500 MG PO TABS: 1000.0000 mg | ORAL_TABLET | Freq: Four times a day (QID) | ORAL | Status: DC | PRN

## 2018-01-14 MED ORDER — METHOCARBAMOL 500 MG PO TABS: 500.0000 mg | ORAL_TABLET | Freq: Four times a day (QID) | ORAL | 0 refills | Status: DC | PRN

## 2018-01-14 MED ORDER — ONDANSETRON 8 MG PO TBDP: 8.0000 mg | ORAL_TABLET | Freq: Three times a day (TID) | ORAL | 1 refills | Status: DC | PRN

## 2018-01-14 MED ORDER — GABAPENTIN 300 MG PO CAPS: 300.0000 mg | ORAL_CAPSULE | Freq: Two times a day (BID) | ORAL | Status: DC

## 2018-01-14 MED ORDER — GABAPENTIN 300 MG PO CAPS: 300.0000 mg | ORAL_CAPSULE | Freq: Three times a day (TID) | ORAL | 1 refills | Status: DC | PRN

## 2018-01-14 NOTE — Discharge Summary (Signed)
Physician Discharge Summary    Patient ID: Linda Wang MRN: [id number] DOB/AGE: 09-Dec-1950  66 y.o.  Admit date: 01/13/2018 Discharge date: 01/14/2018   Hospital Stay = 0 days  Patient Care Team: Karleen Hampshire., MD as PCP - General (Internal Medicine)  Discharge Diagnoses:  Principal Problem:   Chronic cholecystitis with calculus s/p lap cholecystectomy 01/13/2018   1 Day Post-Op  01/13/2018  POST-OPERATIVE DIAGNOSIS:   CHRONIC CHOLECYSTITIS  SURGERY:  01/13/2018  Procedure(s): LAPAROSCOPIC CHOLECYSTECTOMY WITH INTRAOPERATIVE CHOLANGIOGRAM  SURGEON:    Surgeon(s): Armandina Gemma, MD  Consults: None  Hospital Course:   The patient underwent the surgery above.  Postoperatively, the patient gradually mobilized and advanced to a solid diet.  Pain and other symptoms were treated aggressively.  She had headaches with tramadol and hydrocodone.  Recurring problem with narcotics.  But otherwise her pain was controlled with ibuprofen and ice pack.  By the time of discharge, the patient was walking well the hallways, eating food, having flatus.  Pain was well-controlled on an oral medications.  Based on meeting discharge criteria and continuing to recover, I felt it was safe for the patient to be discharged from the hospital to further recover with close followup. Postoperative recommendations were discussed in detail.  They are written as well.  Discharged Condition: good  Disposition:  Follow-up Information    Armandina Gemma, MD. Schedule an appointment as soon as possible for a visit in 3 weeks.   Specialty:  General Surgery Why:  For wound re-check Contact information: [place] [address] [phone]           Discharge disposition: 01-Home or Self Care       Discharge Instructions    Call MD for:   Complete by:  As directed    FEVER > 101.5 F  (temperatures < 101.5 F are not significant)   Call MD for:  extreme fatigue    Complete by:  As directed    Call MD for:  persistant dizziness or light-headedness   Complete by:  As directed    Call MD for:  persistant nausea and vomiting   Complete by:  As directed    Call MD for:  redness, tenderness, or signs of infection (pain, swelling, redness, odor or green/yellow discharge around incision site)   Complete by:  As directed    Call MD for:  severe uncontrolled pain   Complete by:  As directed    Diet - low sodium heart healthy   Complete by:  As directed    Start with a bland diet such as soups, liquids, starchy foods, low fat foods, etc. the first few days at home. Gradually advance to a solid, low-fat, high fiber diet by the end of the first week at home.   Add a fiber supplement to your diet (Metamucil, etc) If you feel full, bloated, or constipated, stay on a full liquid or pureed/blenderized diet for a few days until you feel better and are no longer constipated.   Discharge instructions   Complete by:  As directed    See Discharge Instructions If you are not getting better after two weeks or are noticing you are getting worse, contact our office [phone] for further advice.  We may need to adjust your medications, re-evaluate you in the office, send you to the emergency room, or see what other things we can do to help. The clinic staff is available to answer your questions  during regular business hours (8:30am-5pm).  Please don't hesitate to call and ask to speak to one of our nurses for clinical concerns.    A surgeon from [facility] is always on call at the hospitals 24 hours/day If you have a medical emergency, go to the nearest emergency room or call 911.   Discharge wound care:   Complete by:  As directed    It is good for closed incisions and even open wounds to be washed every day.  Shower every day.  Short baths are fine.  Wash the incisions and wounds clean with soap & water.     You may leave closed incisions open to air if  it is dry.   You may cover the incision with clean gauze & replace it after your daily shower for comfort.   If you have skin tapes (Steristrips) or skin glue (Dermabond) on your incision, leave them in place.  They will fall off on their own like a scab.  You may trim any edges that curl up with clean scissors.   Driving Restrictions   Complete by:  As directed    You may drive when: - you are no longer taking narcotic prescription pain medication - you can comfortably wear a seatbelt - you can safely make sudden turns/stops without pain.   Increase activity slowly   Complete by:  As directed    Start light daily activities --- self-care, walking, climbing stairs- beginning the day after surgery.  Gradually increase activities as tolerated.  Control your pain to be active.  Stop when you are tired.  Ideally, walk several times a day, eventually an hour a day.   Most people are back to most day-to-day activities in a few weeks.  It takes 4-6 weeks to get back to unrestricted, intense activity. If you can walk 30 minutes without difficulty, it is safe to try more intense activity such as jogging, treadmill, bicycling, low-impact aerobics, swimming, etc. Save the most intensive and strenuous activity for last (Usually 4-8 weeks after surgery) such as sit-ups, heavy lifting, contact sports, etc.  Refrain from any intense heavy lifting or straining until you are off narcotics for pain control.  You will have off days, but things should improve week-by-week. DO NOT PUSH THROUGH PAIN.  Let pain be your guide: If it hurts to do something, don't do it.   Lifting restrictions   Complete by:  As directed    If you can walk 30 minutes without difficulty, it is safe to try more intense activity such as jogging, treadmill, bicycling, low-impact aerobics, swimming, etc. Save the most intensive and strenuous activity for last (Usually 4-8 weeks after surgery) such as sit-ups, heavy lifting, contact sports, etc.    Refrain from any intense heavy lifting or straining until you are off narcotics for pain control.  You will have off days, but things should improve week-by-week. DO NOT PUSH THROUGH PAIN.  Let pain be your guide: If it hurts to do something, don't do it.  Pain is your body warning you to avoid that activity for another week until the pain goes down.   May shower / Bathe   Complete by:  As directed    May walk up steps   Complete by:  As directed    Sexual Activity Restrictions   Complete by:  As directed    You may have sexual intercourse when it is comfortable. If it hurts to do something, stop.  Allergies as of 01/14/2018      Reactions   Morphine And Related Other (See Comments)   Headaches    Other    Unable to tolerate NOSE SPRAYS: causes her throat to swell   Statins    myalgia   Sulfonamide Derivatives Hives   Tramadol Other (See Comments)   HEADACHES      Medication List    STOP taking these medications   HYDROcodone-acetaminophen 5-325 MG tablet Commonly known as:  NORCO/VICODIN     TAKE these medications   ALPRAZolam 0.5 MG tablet Commonly known as:  XANAX Take 0.25-0.5 mg by mouth 3 (three) times daily as needed for anxiety.   aspirin EC 81 MG tablet Take 81 mg by mouth daily.   carvedilol 6.25 MG tablet Commonly known as:  COREG Take 6.25 mg by mouth 2 (two) times daily with a meal.   estradiol 1 MG tablet Commonly known as:  ESTRACE Take 1 mg by mouth daily.   Fiber Powd Take 1 Scoop by mouth daily.   gabapentin 300 MG capsule Commonly known as:  NEURONTIN Take 1 capsule (300 mg total) by mouth every 8 (eight) hours as needed (PAIN).   ibuprofen 200 MG tablet Commonly known as:  ADVIL,MOTRIN Take 600 mg by mouth at bedtime as needed for moderate pain.   levothyroxine 88 MCG tablet Commonly known as:  SYNTHROID, LEVOTHROID Take 88 mcg by mouth daily before breakfast.   methocarbamol 500 MG tablet Commonly known as:  ROBAXIN Take 1-2  tablets (500-1,000 mg total) by mouth every 6 (six) hours as needed for muscle spasms.   ondansetron 8 MG disintegrating tablet Commonly known as:  ZOFRAN ODT Take 1 tablet (8 mg total) by mouth every 8 (eight) hours as needed for nausea or vomiting.   PROVENTIL HFA 108 (90 Base) MCG/ACT inhaler Generic drug:  albuterol Inhale 2 puffs into the lungs 2 (two) times daily. Not using   SF 5000 PLUS 1.1 % Crea dental cream Generic drug:  sodium fluoride Place 1 application onto teeth at bedtime.   sodium chloride 0.65 % Soln nasal spray Commonly known as:  OCEAN Place 1 spray into both nostrils as needed for congestion.   SYSTANE OP Place 1 drop into the left eye 3 (three) times daily.   VITAMIN C PO Take 3 tablets by mouth daily.   vitamin E 400 UNIT capsule Take 800 Units by mouth daily.            Discharge Care Instructions  (From admission, onward)        Start     Ordered   01/14/18 0000  Discharge wound care:    Comments:  It is good for closed incisions and even open wounds to be washed every day.  Shower every day.  Short baths are fine.  Wash the incisions and wounds clean with soap & water.     You may leave closed incisions open to air if it is dry.   You may cover the incision with clean gauze & replace it after your daily shower for comfort.   If you have skin tapes (Steristrips) or skin glue (Dermabond) on your incision, leave them in place.  They will fall off on their own like a scab.  You may trim any edges that curl up with clean scissors.   01/14/18 0757      Significant Diagnostic Studies:  Results for orders placed or performed during the hospital encounter of 01/11/18 (from the  past 72 hour(s))  Basic metabolic panel     Status: Abnormal   Collection Time: 01/11/18  9:34 AM  Result Value Ref Range   Sodium 139 135 - 145 mmol/L   Potassium 3.9 3.5 - 5.1 mmol/L   Chloride 104 98 - 111 mmol/L    Comment: Please note change in reference range.     CO2 27 22 - 32 mmol/L   Glucose, Bld 102 (H) 70 - 99 mg/dL    Comment: Please note change in reference range.   BUN 13 8 - 23 mg/dL    Comment: Please note change in reference range.   Creatinine, Ser 0.58 0.44 - 1.00 mg/dL   Calcium 9.1 8.9 - 10.3 mg/dL   GFR calc non Af Amer >60 >60 mL/min   GFR calc Af Amer >60 >60 mL/min    Comment: (NOTE) The eGFR has been calculated using the CKD EPI equation. This calculation has not been validated in all clinical situations. eGFR's persistently <60 mL/min signify possible Chronic Kidney Disease.    Anion gap 8 5 - 15    Comment: Performed at [facility], [place] [address] [place], [place] [address]  CBC     Status: None   Collection Time: 01/11/18  9:34 AM  Result Value Ref Range   WBC 5.5 4.0 - 10.5 K/uL   RBC 4.62 3.87 - 5.11 MIL/uL   Hemoglobin 13.8 12.0 - 15.0 g/dL   HCT 41.7 36.0 - 46.0 %   MCV 90.3 78.0 - 100.0 fL   MCH 29.9 26.0 - 34.0 pg   MCHC 33.1 30.0 - 36.0 g/dL   RDW 12.3 11.5 - 15.5 %   Platelets 266 150 - 400 K/uL    Comment: Performed at [facility], [place] [address] [place], [place] [address]    Dg Chest 2 View  Result Date: 01/11/2018 CLINICAL DATA:  Preoperative examination prior to cholecystectomy. History of hypertension, asthma, nonsmoker. EXAM: CHEST - 2 VIEW COMPARISON:  PA and lateral chest x-ray of May 31, 2016 FINDINGS: The lungs are mildly hyperinflated with hemidiaphragm flattening. There is no focal infiltrate. The heart and pulmonary vascularity are normal. The mediastinum is normal in width. There is no pleural effusion. There is mild multilevel degenerative disc disease of the thoracic spine. IMPRESSION: Mild hyperinflation consistent with the history of reactive airway disease. There is no pneumonia nor other acute cardiopulmonary abnormality. Electronically Signed   By: David  Martinique M.D.   On: 01/11/2018 11:40   Dg Cholangiogram Operative  Result Date:  01/13/2018 CLINICAL DATA:  Gallstones EXAM: INTRAOPERATIVE CHOLANGIOGRAM TECHNIQUE: Cholangiographic images from the C-arm fluoroscopic device were submitted for interpretation post-operatively. Please see the procedural report for the amount of contrast and the fluoroscopy time utilized. COMPARISON:  None. FINDINGS: Contrast fills the biliary tree and duodenum without filling defects in the common bile duct. IMPRESSION: Patent biliary tree. Electronically Signed   By: Marybelle Killings M.D.   On: 01/13/2018 11:38    Discharge Exam: Blood pressure (!) 151/80, pulse 80, temperature 98 F (36.7 C), temperature source Oral, resp. rate 16, height 5' 2.5" (1.588 m), weight 79.8 kg (176 lb), SpO2 95 %.  General: Pt awake/alert/oriented x4 in No acute distress Eyes: PERRL, normal EOM.  Sclera clear.  No icterus Neuro: CN II-XII intact w/o focal sensory/motor deficits. Lymph: No head/neck/groin lymphadenopathy Psych:  No delerium/psychosis/paranoia.  Pleasant.  Chatty. HENT: Normocephalic, Mucus membranes moist.  No thrush Neck: Supple, No tracheal deviation  Chest: No chest wall pain w good excursion CV:  Pulses intact.  Regular rhythm MS: Normal AROM mjr joints.  No obvious deformity Abdomen: Soft.  Nondistended.  Mildly tender at incisions only.  No evidence of peritonitis.  No incarcerated hernias. Ext:  SCDs BLE.  No mjr edema.  No cyanosis Skin: No petechiae / purpura  Past Medical History:  Diagnosis Date  . Anxiety   . Aortic atherosclerosis ([place])   . Asthma    recent oral Prednisone use last dose 04-13-16  . Cataract    Bilateral  . DDD (degenerative disc disease), lumbar   . Depression   . Diverticulitis   . Diverticulosis of colon (without mention of hemorrhage)   . Dizziness    EVALUATED WITH MANY TESTS INCLUDING CT SCAN HEAD--NO PROBLEMS FOUND--PT STILL HAS OCCAS BOUT OF DIZZINESS.  . Gallstones   . GERD (gastroesophageal reflux disease)    noted on an upper GI endoscopy but does  not require medication  . Hemorrhoids   . History of bronchitis   . Hyperlipidemia   . Hypertension   . Hypothyroidism   . Myositis   . OA (osteoarthritis)    generalized  . Obesity   . Seasonal allergies   . 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 Thyroid cancer ([place])    Thyroid  . TMJ (temporomandibular joint syndrome)    BILATERAL- uses nightly mouthpiece.  . Tubular adenoma of colon 01/20/2000  . Umbilical hernia     Past Surgical History:  Procedure Laterality Date  . ABDOMINAL HYSTERECTOMY    . APPENDECTOMY    . CESAREAN SECTION    . CHOLECYSTECTOMY N/A 01/13/2018   Procedure: LAPAROSCOPIC CHOLECYSTECTOMY WITH INTRAOPERATIVE CHOLANGIOGRAM;  Surgeon: Armandina Gemma, MD;  Location: [facility];  Service: General;  Laterality: N/A;  . COLONOSCOPY  10/04/2012  . HAND NEUROPLASTY     decompression median nerve at carpal tunnel  . HEMORRHOID SURGERY     in office on 05/26/2015   . HEMORROIDECTOMY    . INSERTION OF MESH N/A 04/29/2016   Procedure: INSERTION OF MESH;  Surgeon: Armandina Gemma, MD;  Location: [facility];  Service: General;  Laterality: N/A;  . PARTIAL COLECTOMY N/A 10/12/2012   Procedure: SIGMOID COLECTOMY;  Surgeon: Earnstine Regal, MD;  Location: [facility];  Service: General;  Laterality: N/A;  . [place]    . THYROIDECTOMY, PARTIAL     Positive cancer-no further issues  . TUBAL LIGATION    . UPPER GI ENDOSCOPY    . VENTRAL HERNIA REPAIR N/A 04/29/2016   Procedure: LAPAROSCOPIC VENTRAL INCISIONAL  HERNIA;  Surgeon: Armandina Gemma, MD;  Location: [facility];  Service: General;  Laterality: N/A;    Social History   Socioeconomic History  . Marital status: Single    Spouse name: Not on file  . Number of children: 1  . Years of education: Not on file  . Highest education level: Not on file  Occupational History  . Occupation: school Surveyor, quantity: [employer]  . Financial resource strain: Not on file  . Food insecurity:    Worry: Not on file     Inability: Not on file  . Transportation needs:    Medical: Not on file    Non-medical: Not on file  Tobacco Use  . Smoking status: Never Smoker  . Smokeless tobacco: Never Used  Substance and Sexual Activity  . Alcohol use: No  . Drug use: No  . Sexual  activity: Not Currently    Birth control/protection: Surgical  Lifestyle  . Physical activity:    Days per week: Not on file    Minutes per session: Not on file  . Stress: Not on file  Relationships  . Social connections:    Talks on phone: Not on file    Gets together: Not on file    Attends religious service: Not on file    Active member of club or organization: Not on file    Attends meetings of clubs or organizations: Not on file    Relationship status: Not on file  . Intimate partner violence:    Fear of current or ex partner: Not on file    Emotionally abused: Not on file    Physically abused: Not on file    Forced sexual activity: Not on file  Other Topics Concern  . Not on file  Social History Narrative  . Not on file    Family History  Problem Relation Age of Onset  . Thyroid cancer Mother   . Diabetes Mother   . Heart disease Mother   . Colon cancer Father   . Bladder Cancer Father   . Prostate cancer Brother   . Prostate cancer Brother   . Colonic polyp Brother   . Colon polyps Brother     Current Facility-Administered Medications  Medication Dose Route Frequency Provider Last Rate Last Dose  . acetaminophen (TYLENOL) tablet 650 mg  650 mg Oral Q6H PRN Armandina Gemma, MD   650 mg at 01/13/18 1853   Or  . acetaminophen (TYLENOL) suppository 650 mg  650 mg Rectal Q6H PRN Armandina Gemma, MD      . ALPRAZolam Duanne Moron) tablet 0.25-0.5 mg  0.25-0.5 mg Oral TID PRN Armandina Gemma, MD   0.25 mg at 01/14/18 0115  . carvedilol (COREG) tablet 6.25 mg  6.25 mg Oral BID WC Armandina Gemma, MD   6.25 mg at 01/14/18 0719  . estradiol (ESTRACE) tablet 1 mg  1 mg Oral Daily Gerkin, Sherren Mocha, MD      . gabapentin (NEURONTIN)  capsule 300 mg  300 mg Oral BID Michael Boston, MD      . HYDROmorphone (DILAUDID) injection 1 mg  1 mg Intravenous Q2H PRN Armandina Gemma, MD      . levothyroxine (SYNTHROID, LEVOTHROID) tablet 88 mcg  88 mcg Oral QAC breakfast Armandina Gemma, MD   88 mcg at 01/14/18 0719  . methocarbamol (ROBAXIN) tablet 1,000 mg  1,000 mg Oral Q6H PRN Michael Boston, MD      . ondansetron (ZOFRAN-ODT) disintegrating tablet 4 mg  4 mg Oral Q6H PRN Armandina Gemma, MD       Or  . ondansetron (ZOFRAN) injection 4 mg  4 mg Intravenous Q6H PRN Armandina Gemma, MD         Allergies  Allergen Reactions  . Morphine And Related Other (See Comments)    Headaches   . Other     Unable to tolerate NOSE SPRAYS: causes her throat to swell  . Statins     myalgia  . Sulfonamide Derivatives Hives  . Tramadol Other (See Comments)    HEADACHES    Signed: [place] Peters, MD, FACS, MASCRS [facility]    [address] [place], [place] [address] [phone] Main / Paging [phone] Fax   01/14/2018, 7:57 AM

## 2018-01-14 NOTE — Discharge Instructions (Signed)
[facility]  LAPAROSCOPIC SURGERY:  POST-OP INSTRUCTIONS  Always review your discharge instruction sheet given to you by the facility where your surgery was performed.  A prescription for pain medication may be given to you upon discharge.  Take your pain medication as prescribed.  If narcotic pain medicine is not needed, then you may take acetaminophen (Tylenol) or ibuprofen (Advil) as needed.  Take your usually prescribed medications unless otherwise directed.  If you need a refill on your pain medication, please contact your pharmacy.  They will contact our office to request authorization. Prescriptions will not be filled after 5 P.M. or on weekends.  You should follow a light diet the first few days after arrival home, such as soup and crackers or toast.  Be sure to include plenty of fluids daily.  Most patients will experience some swelling and bruising in the area of the incisions.  Ice packs will help.  Swelling and bruising can take several days to resolve.   It is common to experience some constipation after surgery.  Increasing fluid intake and taking a stool softener (such as Colace) will usually help or prevent this problem from occurring.  A mild laxative (Milk of Magnesia or Miralax) should be taken according to package instructions if there has been no bowel movement after 48 hours.  You will have steri-strips and a gauze dressing over your incisions.  You may remove the gauze bandage on the second day after surgery, and you may shower at that time.  Leave your steri-strips (small skin tapes) in place directly over the incision.  These strips should remain on the skin for 5-7 days and then be removed.  You may get them wet in the shower and pat them dry.  Any sutures or staples will be removed at the office during your follow-up visit.  ACTIVITIES:  You may resume regular (light) daily activities beginning the next day - such as daily self-care, walking,  climbing stairs - gradually increasing activities as tolerated.  You may have sexual intercourse when it is comfortable.  Refrain from any heavy lifting or straining until approved by your doctor.  You may drive when you are no longer taking prescription pain medication, you can comfortably wear a seatbelt, and you can safely maneuver your car and apply brakes.  You should see your doctor in the office for a follow-up appointment approximately 2-3 weeks after your surgery.  Make sure that you call for this appointment within a day or two after you arrive home to insure a convenient appointment time.  WHEN TO CALL YOUR DOCTOR: 1. Fever over 101.0 2. Inability to urinate 3. Continued bleeding from incision 4. Increased pain, redness, or drainage from the incision 5. Increasing abdominal pain  The clinic staff is available to answer your questions during regular business hours.  Please dont hesitate to call and ask to speak to one of the nurses for clinical concerns.  If you have a medical emergency, go to the nearest emergency room or call 911.  A surgeon from [facility] is always on call for the hospital.  Earnstine Regal, MD, [facility] Office: [place]:  [place] [phone]  Website: [email or web address]  Managing Pain  ######################################################################   CONTROL PAIN Control pain so that you can walk, sleep, tolerate sneezing/coughing, go up/down stairs.  (Good pain control is not pain free only when lying still, unable to move)  WALK Walk an hour a day.  Control your pain to do that.   HAVE A BOWEL MOVEMENT DAILY Keep your bowels regular to avoid problems.  OK to try a laxative to override constipation.  OK to use an antidairrheal to slow down diarrhea.  Call if not better after 2 tries  CALL IF YOU HAVE PROBLEMS/CONCERNS Call if you are still struggling despite  following these instructions. Call if you have concerns not answered by these instructions  ######################################################################    Pain after surgery or related to activity is often due to strain/injury to muscle, tendon, nerves and/or incisions.  This pain is usually short-term and will improve in a few months.   Many people find it helpful to do the following things TOGETHER to help speed the process of healing and to get back to regular activity more quickly:  1. Avoid heavy physical activity at first a. No lifting greater than 20 pounds at first, then increase to lifting as tolerated over the next few weeks b. Do not push through the pain.  Listen to your body and avoid positions and maneuvers than reproduce the pain.  Wait a few days before trying something more intense c. Walking is okay as tolerated, but go slowly and stop when getting sore.  If you can walk 30 minutes without stopping or pain, you can try more intense activity (running, jogging, aerobics, cycling, swimming, treadmill, sex, sports, weightlifting, etc ) d. Remember: If it hurts to do it, then dont do it!  2. Take Anti-inflammatory medication a. Choose ONE of the following over-the-counter medications: i.            Acetaminophen 500mg  tabs (Tylenol) 1-2 pills with every meal and just before bedtime (avoid if you have liver problems) ii.            Naproxen 220mg  tabs (ex. Aleve) 1-2 pills twice a day (avoid if you have kidney, stomach, IBD, or bleeding problems) iii. Ibuprofen 200mg  tabs (ex. Advil, Motrin) 3-4 pills with every meal and just before bedtime (avoid if you have kidney, stomach, IBD, or bleeding problems) b. Take with food/snack around the clock for 1-2 weeks i. This helps the muscle and nerve tissues become less irritable and calm down faster  3. Use a Heating pad or Ice/Cold Pack a. 4-6 times a day b. May use warm bath/hottub  or showers  4. Try Gentle Massage  and/or Stretching  a. at the area of pain many times a day b. stop if you feel pain - do not overdo it  Try these steps together to help you body heal faster and avoid making things get worse.  Doing just one of these things may not be enough.    If you are not getting better after two weeks or are noticing you are getting worse, contact our office for further advice; we may need to re-evaluate you & see what other things we can do to help.

## 2018-03-01 ENCOUNTER — Encounter (HOSPITAL_COMMUNITY): Payer: Self-pay | Admitting: Emergency Medicine

## 2018-03-01 ENCOUNTER — Emergency Department (HOSPITAL_COMMUNITY)
Admission: EM | Admit: 2018-03-01 | Discharge: 2018-03-01 | Disposition: A | Discharge status: Home / Self Care | Payer: Medicare Other | Attending: Emergency Medicine | Admitting: Emergency Medicine

## 2018-03-01 ENCOUNTER — Other Ambulatory Visit: Payer: Self-pay

## 2018-03-01 DIAGNOSIS — E039 Hypothyroidism, unspecified: Secondary | ICD-10-CM | POA: Insufficient documentation

## 2018-03-01 DIAGNOSIS — J45909 Unspecified asthma, uncomplicated: Secondary | ICD-10-CM | POA: Insufficient documentation

## 2018-03-01 DIAGNOSIS — Z79899 Other long term (current) drug therapy: Secondary | ICD-10-CM | POA: Diagnosis not present

## 2018-03-01 DIAGNOSIS — R2243 Localized swelling, mass and lump, lower limb, bilateral: Secondary | ICD-10-CM | POA: Insufficient documentation

## 2018-03-01 DIAGNOSIS — Z85850 Personal history of malignant neoplasm of thyroid: Secondary | ICD-10-CM | POA: Insufficient documentation

## 2018-03-01 DIAGNOSIS — Z7982 Long term (current) use of aspirin: Secondary | ICD-10-CM | POA: Insufficient documentation

## 2018-03-01 DIAGNOSIS — I10 Essential (primary) hypertension: Secondary | ICD-10-CM | POA: Diagnosis present

## 2018-03-01 NOTE — ED Provider Notes (Signed)
[facility] EMERGENCY DEPARTMENT Provider Note   CSN: [id number] Arrival date & time: 03/01/18  1235     History   Chief Complaint Chief Complaint  Patient presents with  . Hypertension    HPI Linda Wang is a 67 y.o. female.  HPI    67 year old female presents today with complaints of hypertension. Patient notes a long-standing history of hypertension. She was initially placed on a diuretic but this caused a drop in her potassium. She was switched to carvedilol 6.25 mg twice daily. She notes that when she takes 2 pills per day she has swelling in her lower extremities so she is titrated back to 1 pill daily. She is currently being followed by her primary care provider for this. She was seen at the ophthalmologist office this morning her blood pressure was noted be significantly elevated and told that she would be unable to be seen today. She attempted to call her primary care who instructed she come to the emergency room for evaluation. Patient reports she is asymptomatic, she has no chest pain shortness of breath headache or any complaints. Patient and she checked her blood pressure this morning with the systolic reading in the 433 range which is typical for her.    Past Medical History:  Diagnosis Date  . Anxiety   . Aortic atherosclerosis ([place])   . Asthma    recent oral Prednisone use last dose 04-13-16  . Cataract    Bilateral  . DDD (degenerative disc disease), lumbar   . Depression   . Diverticulitis   . Diverticulosis of colon (without mention of hemorrhage)   . Dizziness    EVALUATED WITH MANY TESTS INCLUDING CT SCAN HEAD--NO PROBLEMS FOUND--PT STILL HAS OCCAS BOUT OF DIZZINESS.  . Gallstones   . GERD (gastroesophageal reflux disease)    noted on an upper GI endoscopy but does not require medication  . Hemorrhoids   . History of bronchitis   . Hyperlipidemia   . Hypertension   . Hypothyroidism   . Myositis   . OA (osteoarthritis)    generalized  . Obesity   . Seasonal allergies   . 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 Thyroid cancer ([place])    Thyroid  . TMJ (temporomandibular joint syndrome)    BILATERAL- uses nightly mouthpiece.  . Tubular adenoma of colon 01/20/2000  . Umbilical hernia     Patient Active Problem List   Diagnosis Date Noted  . Chronic cholecystitis with calculus s/p lap cholecystectomy 01/13/2018 01/12/2018  . Ventral incisional hernia 04/29/2016  . Incisional hernia, without obstruction or gangrene 04/28/2016  . Hemorrhoids, internal, with bleeding 11/22/2012  . Seasonal and perennial allergic rhinitis 05/13/2012  . History of thyroid cancer 09/20/2011  . REFLUX, ESOPHAGEAL 09/10/2009  . LUQ PAIN 08/26/2009  . ADENOMATOUS COLONIC POLYP 08/05/2009  . DIVERTICULOSIS OF COLON 08/05/2009  . Abdominal pain, generalized 08/05/2009  . HYPOTHYROIDISM 08/01/2009  . HYPERCHOLESTEROLEMIA 08/01/2009  . ANXIETY DEPRESSION 08/01/2009  . HYPERTENSION 08/01/2009    Past Surgical History:  Procedure Laterality Date  . ABDOMINAL HYSTERECTOMY    . APPENDECTOMY    . CESAREAN SECTION    . CHOLECYSTECTOMY N/A 01/13/2018   Procedure: LAPAROSCOPIC CHOLECYSTECTOMY WITH INTRAOPERATIVE CHOLANGIOGRAM;  Surgeon: Armandina Gemma, MD;  Location: [facility];  Service: General;  Laterality: N/A;  . COLONOSCOPY  10/04/2012  . HAND NEUROPLASTY     decompression median nerve at carpal tunnel  . HEMORRHOID SURGERY     in office on 05/26/2015   .  HEMORROIDECTOMY    . INSERTION OF MESH N/A 04/29/2016   Procedure: INSERTION OF MESH;  Surgeon: Armandina Gemma, MD;  Location: [facility];  Service: General;  Laterality: N/A;  . PARTIAL COLECTOMY N/A 10/12/2012   Procedure: SIGMOID COLECTOMY;  Surgeon: Earnstine Regal, MD;  Location: [facility];  Service: General;  Laterality: N/A;  . [place]    . THYROIDECTOMY, PARTIAL     Positive cancer-no further issues  . TUBAL LIGATION    . UPPER GI ENDOSCOPY    . VENTRAL HERNIA REPAIR N/A 04/29/2016    Procedure: LAPAROSCOPIC VENTRAL INCISIONAL  HERNIA;  Surgeon: Armandina Gemma, MD;  Location: [facility];  Service: General;  Laterality: N/A;     OB History   None      Home Medications    Prior to Admission medications   Medication Sig Start Date End Date Taking? Authorizing Provider  albuterol (PROVENTIL HFA) 108 (90 BASE) MCG/ACT inhaler Inhale 2 puffs into the lungs 2 (two) times daily. Not using    [provider]  ALPRAZolam Duanne Moron) 0.5 MG tablet Take 0.25-0.5 mg by mouth 3 (three) times daily as needed for anxiety.     [provider]  Ascorbic Acid (VITAMIN C PO) Take 3 tablets by mouth daily.    [provider]  aspirin EC 81 MG tablet Take 81 mg by mouth daily.    [provider]  carvedilol (COREG) 6.25 MG tablet Take 6.25 mg by mouth 2 (two) times daily with a meal.    [provider]  estradiol (ESTRACE) 1 MG tablet Take 1 mg by mouth daily.     [provider]  Fiber POWD Take 1 Scoop by mouth daily.    [provider]  gabapentin (NEURONTIN) 300 MG capsule Take 1 capsule (300 mg total) by mouth every 8 (eight) hours as needed (PAIN). 01/14/18   Michael Boston, MD  ibuprofen (ADVIL,MOTRIN) 200 MG tablet Take 600 mg by mouth at bedtime as needed for moderate pain.    [provider]  levothyroxine (SYNTHROID, LEVOTHROID) 88 MCG tablet Take 88 mcg by mouth daily before breakfast.    [provider]  methocarbamol (ROBAXIN) 500 MG tablet Take 1-2 tablets (500-1,000 mg total) by mouth every 6 (six) hours as needed for muscle spasms. 01/14/18   Michael Boston, MD  ondansetron (ZOFRAN ODT) 8 MG disintegrating tablet Take 1 tablet (8 mg total) by mouth every 8 (eight) hours as needed for nausea or vomiting. 01/14/18   Michael Boston, MD  Polyethyl Glycol-Propyl Glycol (SYSTANE OP) Place 1 drop into the left eye 3 (three) times daily.    [provider]  SF 5000 PLUS 1.1 % CREA dental cream Place 1  application onto teeth at bedtime. 12/12/17   [provider]  sodium chloride (OCEAN) 0.65 % SOLN nasal spray Place 1 spray into both nostrils as needed for congestion.    [provider]  vitamin E 400 UNIT capsule Take 800 Units by mouth daily.    [provider]    Family History Family History  Problem Relation Age of Onset  . Thyroid cancer Mother   . Diabetes Mother   . Heart disease Mother   . Colon cancer Father   . Bladder Cancer Father   . Prostate cancer Brother   . Prostate cancer Brother   . Colonic polyp Brother   . Colon polyps Brother     Social History Social History   Tobacco Use  .  Smoking status: Never Smoker  . Smokeless tobacco: Never Used  Substance Use Topics  . Alcohol use: No  . Drug use: No     Allergies   Morphine and related; Other; Statins; Sulfonamide derivatives; and Tramadol   Review of Systems Review of Systems  All other systems reviewed and are negative.    Physical Exam Updated Vital Signs BP (!) 189/87   Pulse 80   Temp 98.3 F (36.8 C) (Oral)   Resp 16   Ht 5' 2.5" (1.588 m)   Wt 75.8 kg   SpO2 98%   BMI 30.06 kg/m   Physical Exam  Constitutional: She is oriented to person, place, and time. She appears well-developed and well-nourished.  HENT:  Head: Normocephalic and atraumatic.  Eyes: Pupils are equal, round, and reactive to light. Conjunctivae are normal. Right eye exhibits no discharge. Left eye exhibits no discharge. No scleral icterus.  Neck: Normal range of motion. No JVD present. No tracheal deviation present.  Cardiovascular: Normal rate, regular rhythm, normal heart sounds and intact distal pulses.  No murmur heard. Pulmonary/Chest: Effort normal and breath sounds normal. No stridor. No respiratory distress. She has no wheezes. She has no rales. She exhibits no tenderness.  Musculoskeletal: She exhibits no edema.  Neurological: She is alert and oriented to person, place, and  time. Coordination normal.  Psychiatric: She has a normal mood and affect. Her behavior is normal. Judgment and thought content normal.  Nursing note and vitals reviewed.    ED Treatments / Results  Labs (all labs ordered are listed, but only abnormal results are displayed) Labs Reviewed - No data to display  EKG None  Radiology No results found.  Procedures Procedures (including critical care time)  Medications Ordered in ED Medications - No data to display   Initial Impression / Assessment and Plan / ED Course  I have reviewed the triage vital signs and the nursing notes.  Pertinent labs & imaging results that were available during my care of the patient were reviewed by me and considered in my medical decision making (see chart for details).     67 year old female presents today with asymptomatic hypertension. Blood pressure my reading is 162/86, she has no significant complaints. No need for management at this time in the emergent setting, she'll follow-up as an outpatient with her primary care, she is given strict return precautions, she verbalized understanding and agreement to today's plan.  Final Clinical Impressions(s) / ED Diagnoses   Final diagnoses:  Hypertension, unspecified type    ED Discharge Orders    None       Francee Gentile 03/01/18 [place], MD 03/01/18 2125

## 2018-03-01 NOTE — ED Triage Notes (Signed)
Patient states history of high blood pressure and takes medication. Today went to the eye doctor and BP 193/112/ and 198/122 sent to the ED for evaluation.  Stated called primary doctor regarding blood pressure also sent to the ED for evaluation. Patient states feels great and has no complaints.

## 2018-03-01 NOTE — ED Notes (Signed)
Pt stable, ambulatory, and verbalizes understanding of d/c instructions.  

## 2018-03-01 NOTE — Discharge Instructions (Addendum)
Please read attached information. If you experience any new or worsening signs or symptoms please return to the emergency room for evaluation. Please follow-up with your primary care provider or specialist as discussed.  °

## 2018-05-23 ENCOUNTER — Other Ambulatory Visit: Payer: Self-pay | Admitting: Obstetrics & Gynecology

## 2018-05-23 DIAGNOSIS — Z1231 Encounter for screening mammogram for malignant neoplasm of breast: Secondary | ICD-10-CM

## 2018-07-11 ENCOUNTER — Ambulatory Visit
Admission: RE | Admit: 2018-07-11 | Discharge: 2018-07-11 | Disposition: A | Discharge status: Home / Self Care | Payer: Medicare Other | Source: Ambulatory Visit | Attending: Obstetrics & Gynecology | Admitting: Obstetrics & Gynecology

## 2018-07-11 DIAGNOSIS — Z1231 Encounter for screening mammogram for malignant neoplasm of breast: Secondary | ICD-10-CM

## 2018-08-31 ENCOUNTER — Encounter: Payer: Self-pay | Admitting: Physician Assistant

## 2018-09-17 IMAGING — CR DG CHEST 2V
2 series · 2 of 2 positions shown · non-contrast
Comparison: PA and lateral chest x-ray May 31, 2016

CLINICAL DATA: Preoperative examination prior to cholecystectomy.
History of hypertension, asthma, nonsmoker.

EXAM:
CHEST - 2 VIEW

[w chest pa]
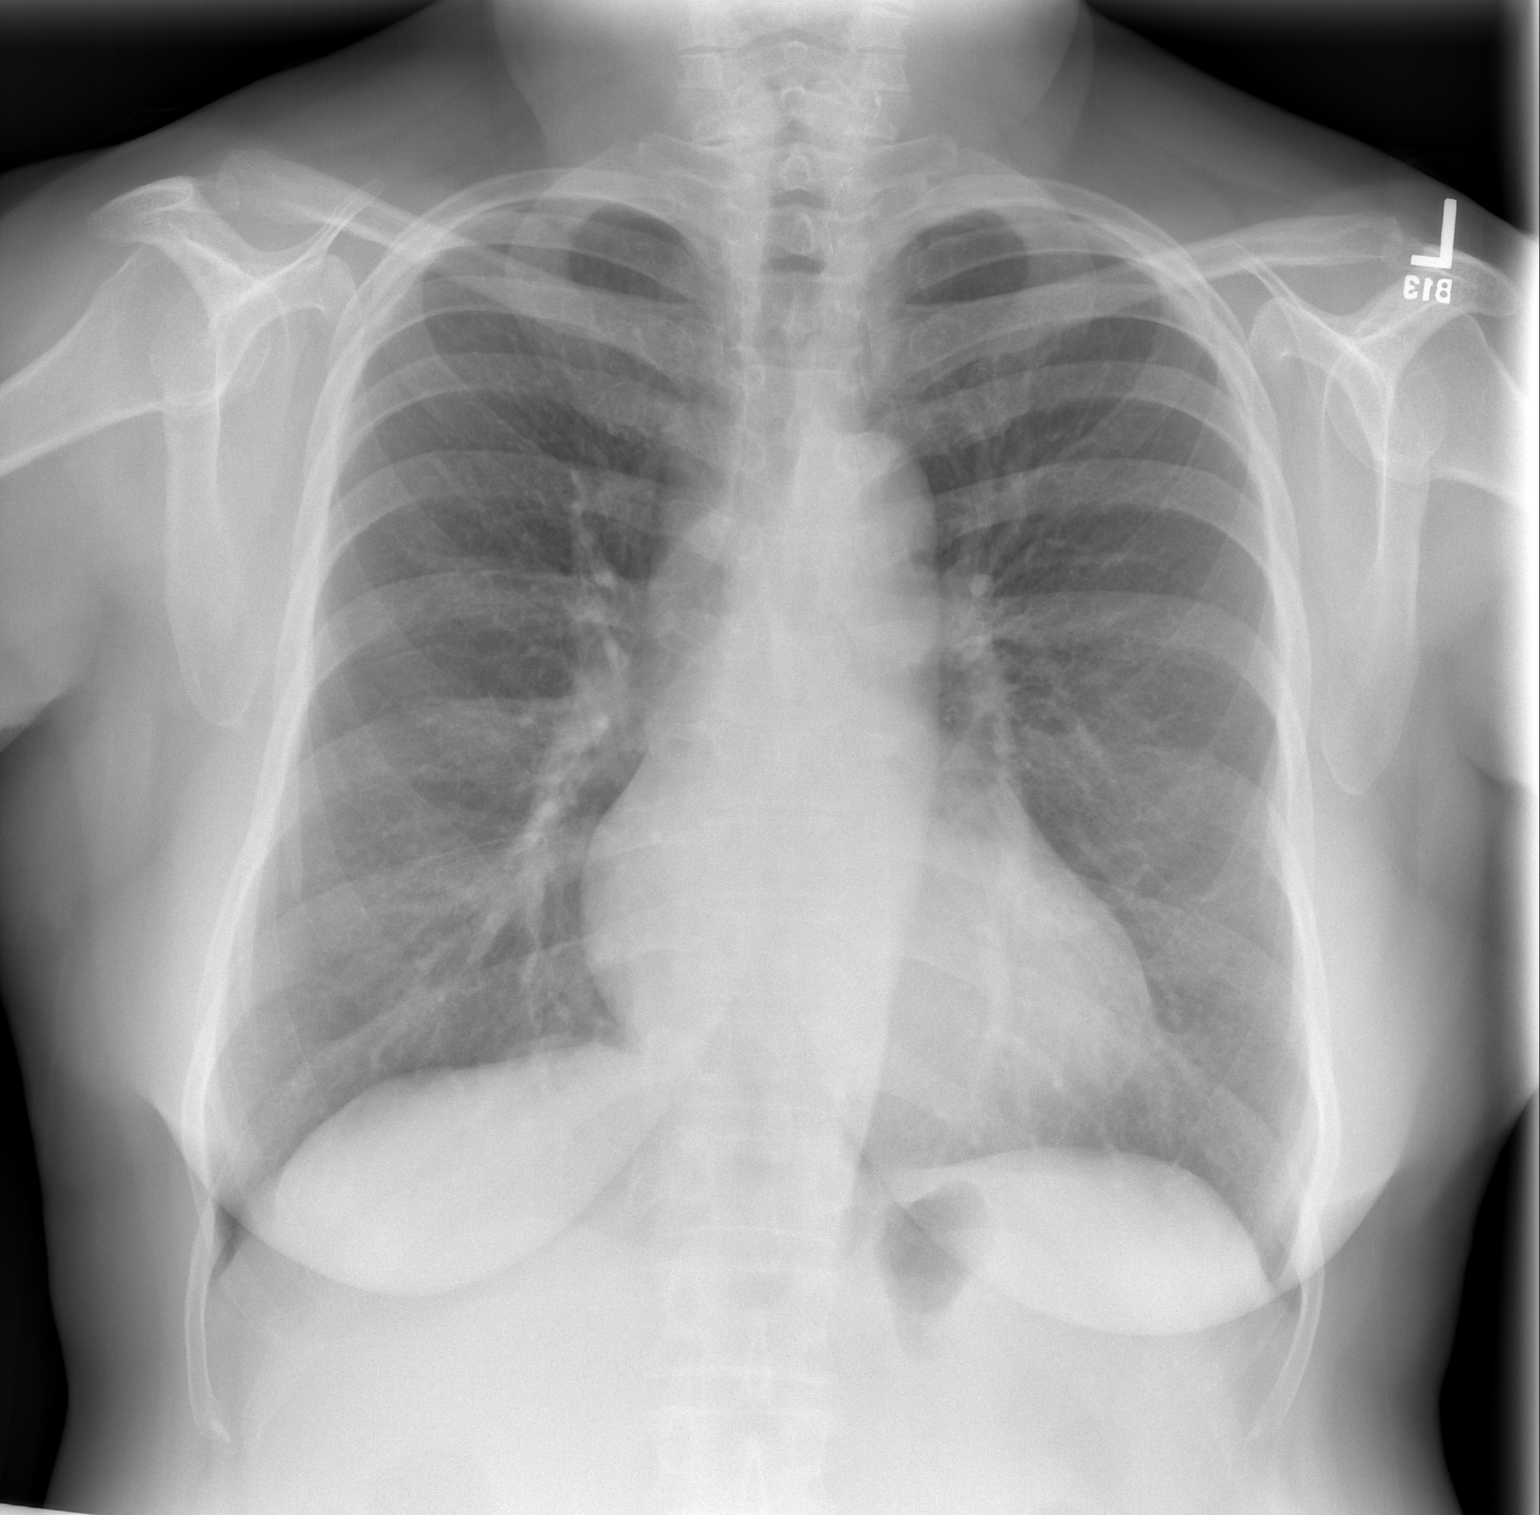

[w chest lat]
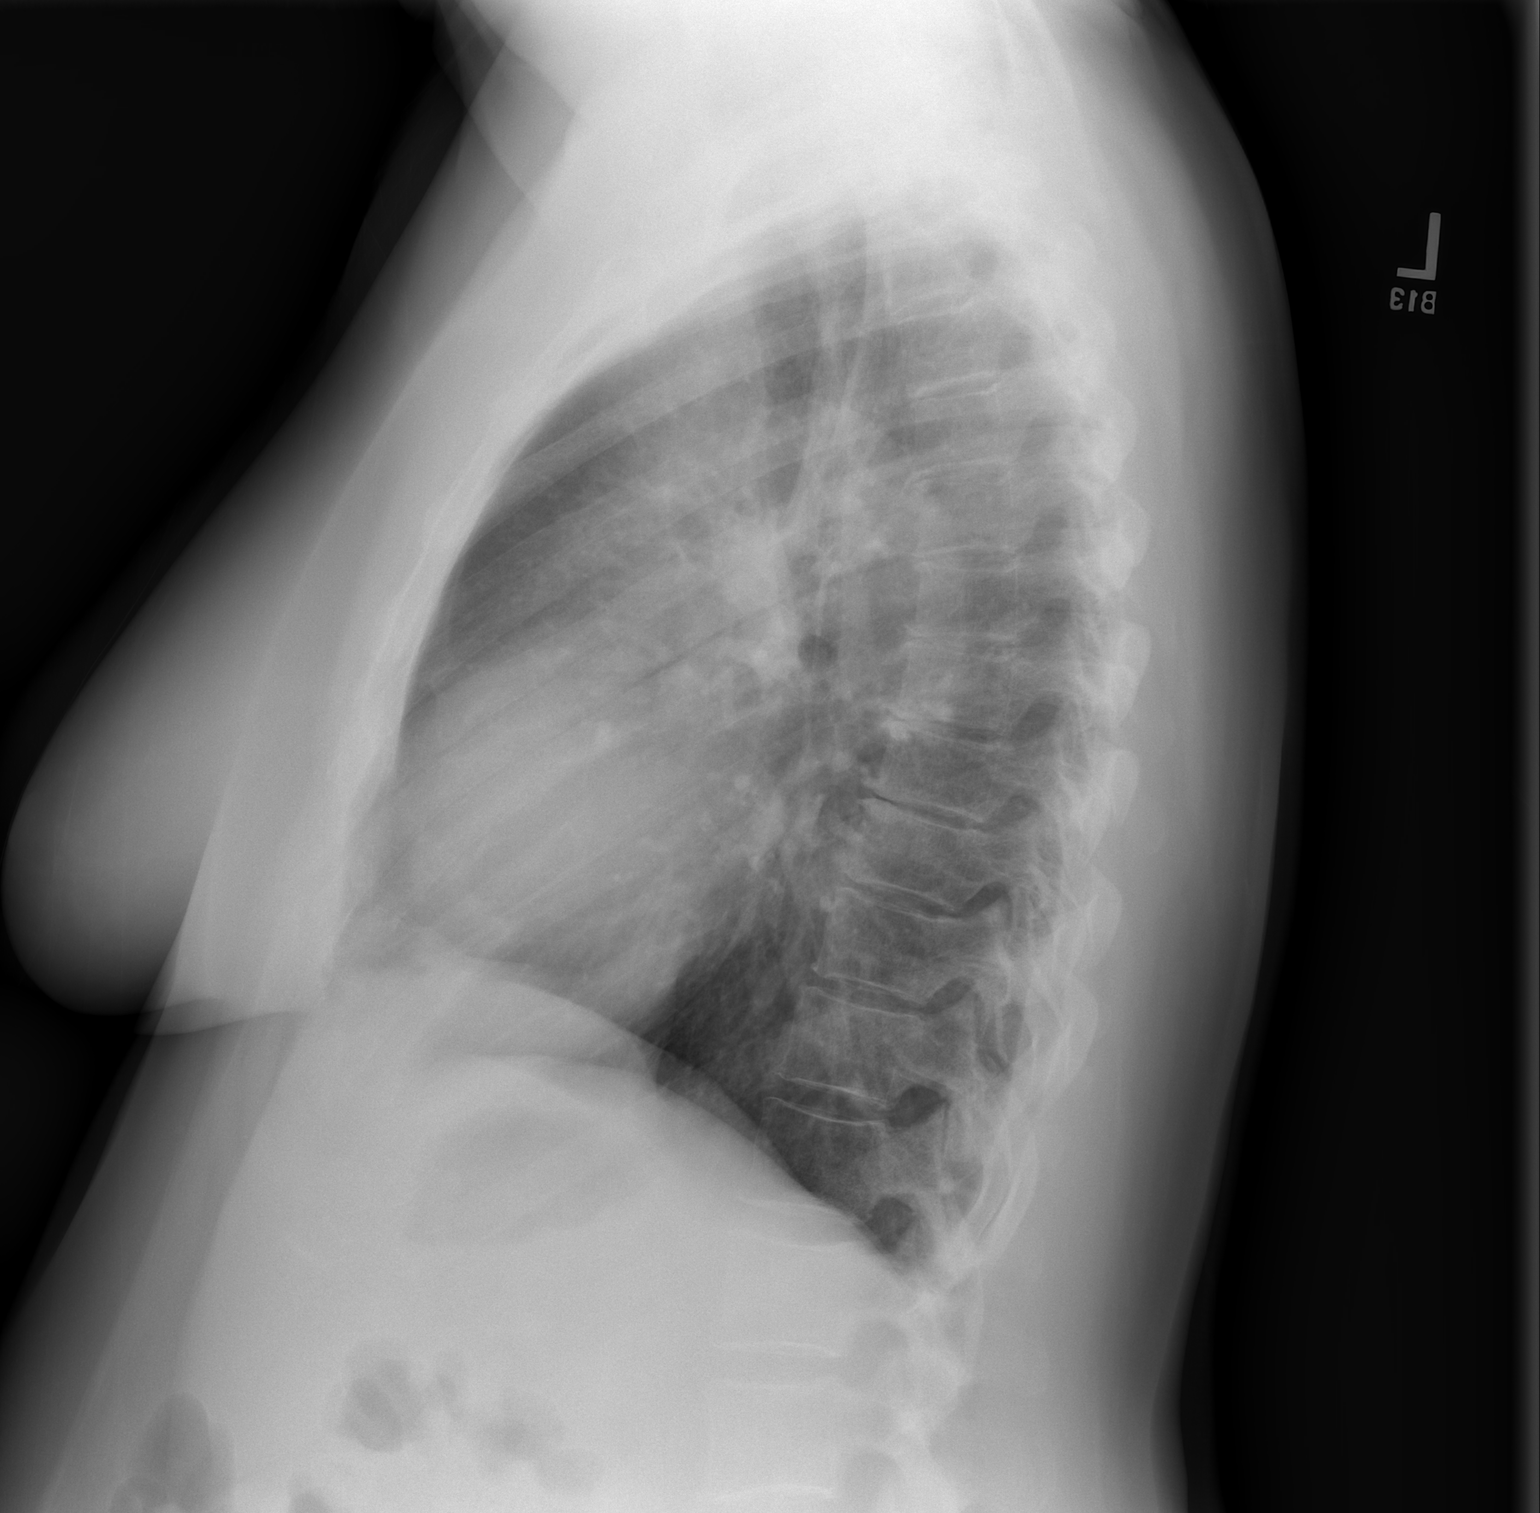

[2 of 2 positions shown; findings below may reference images not displayed]

FINDINGS: The lungs are mildly hyperinflated with hemidiaphragm flattening.
There is no focal infiltrate. The heart and pulmonary vascularity
are normal. The mediastinum is normal in width. There is no pleural
effusion. There is mild multilevel degenerative disc disease of the
thoracic spine.
IMPRESSION: Mild hyperinflation consistent with the history of reactive airway
disease. There is no pneumonia nor other acute cardiopulmonary
abnormality.

## 2018-09-19 ENCOUNTER — Telehealth: Payer: Self-pay | Admitting: Physician Assistant

## 2018-09-19 ENCOUNTER — Ambulatory Visit (INDEPENDENT_AMBULATORY_CARE_PROVIDER_SITE_OTHER): Payer: Medicare Other | Admitting: Physician Assistant

## 2018-09-19 ENCOUNTER — Encounter: Payer: Self-pay | Admitting: Physician Assistant

## 2018-09-19 ENCOUNTER — Other Ambulatory Visit: Payer: Self-pay

## 2018-09-19 VITALS — BP 176/98 | HR 84 | Temp 97.6°F | Ht 62.5 in | Wt 166.0 lb

## 2018-09-19 DIAGNOSIS — K625 Hemorrhage of anus and rectum: Secondary | ICD-10-CM | POA: Diagnosis not present

## 2018-09-19 DIAGNOSIS — Z800 Family history of malignant neoplasm of digestive organs: Secondary | ICD-10-CM

## 2018-09-19 DIAGNOSIS — K648 Other hemorrhoids: Secondary | ICD-10-CM | POA: Diagnosis not present

## 2018-09-19 DIAGNOSIS — K6289 Other specified diseases of anus and rectum: Secondary | ICD-10-CM | POA: Diagnosis not present

## 2018-09-19 IMAGING — RF DG CHOLANGIOGRAM OPERATIVE
1 series · 7 of 7 positions shown · non-contrast
Comparison: None.

CLINICAL DATA: Gallstones

EXAM:
INTRAOPERATIVE CHOLANGIOGRAM
TECHNIQUE: Cholangiographic images from the C-arm fluoroscopic device were
submitted for interpretation post-operatively. Please see the
procedural report for the amount of contrast and the fluoroscopy
time utilized.

[Series 1: run · 2 acquisitions, 7 frames shown]
[im 1/2]
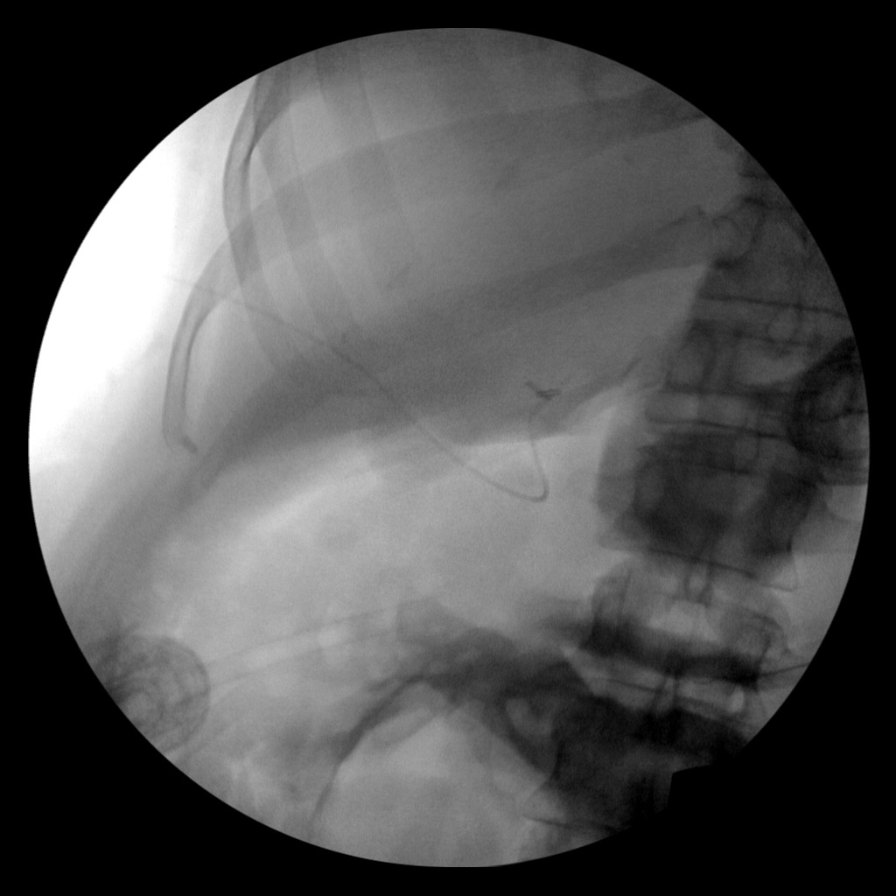
[im 1/2]
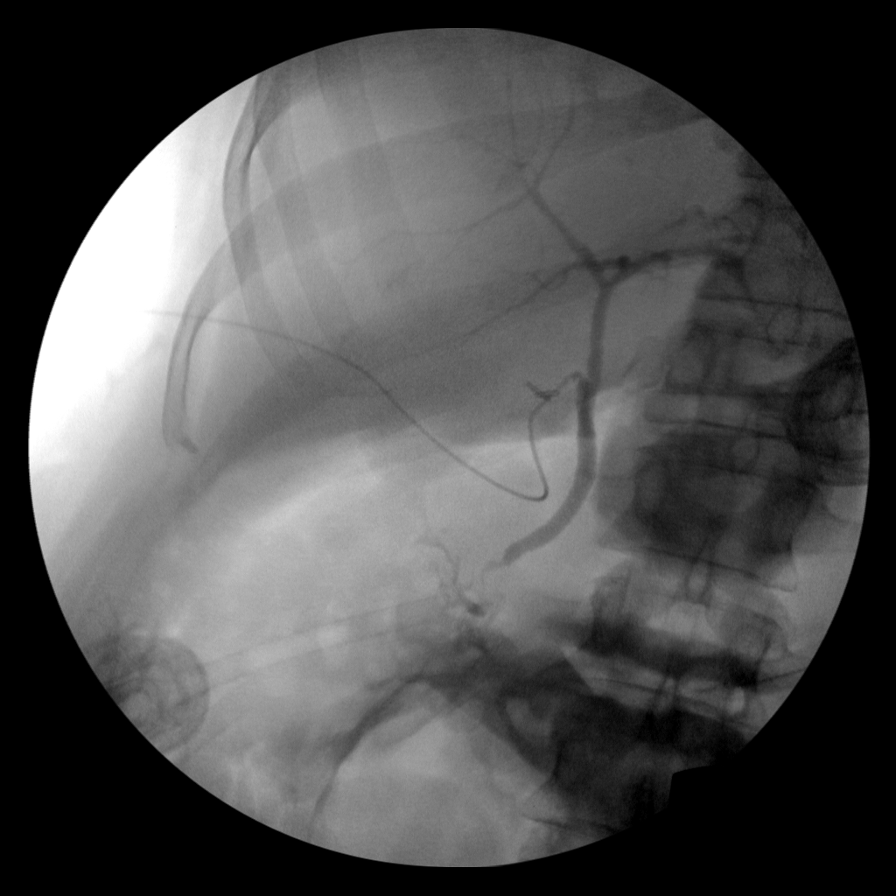
[im 1/2]
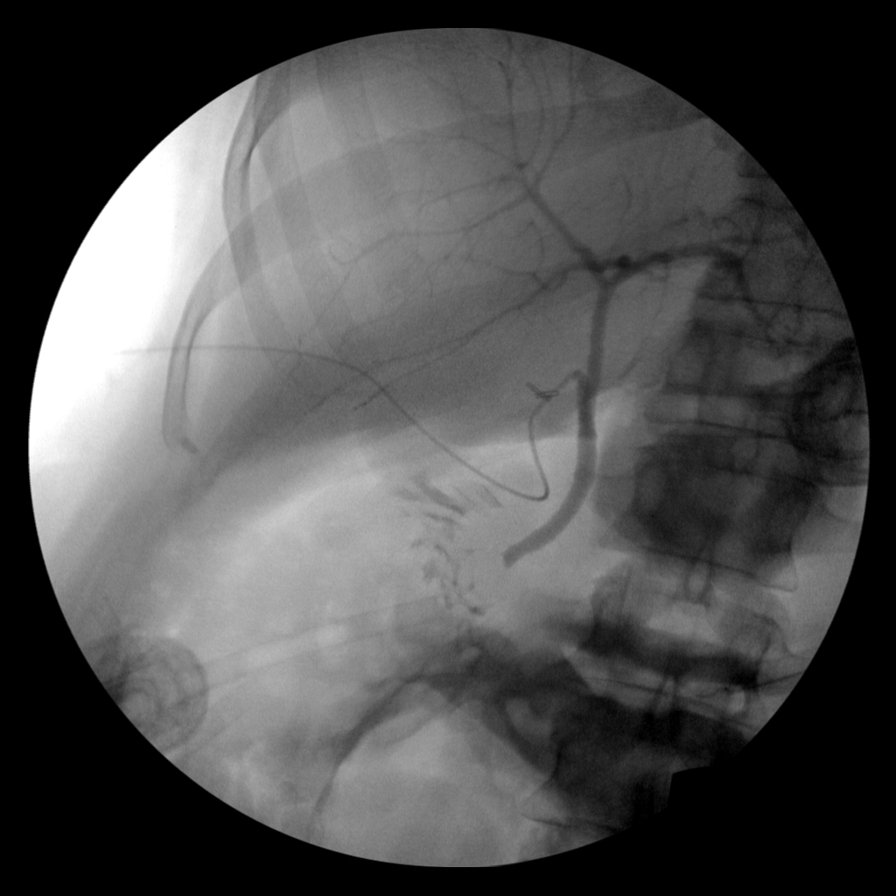
[im 1/2]
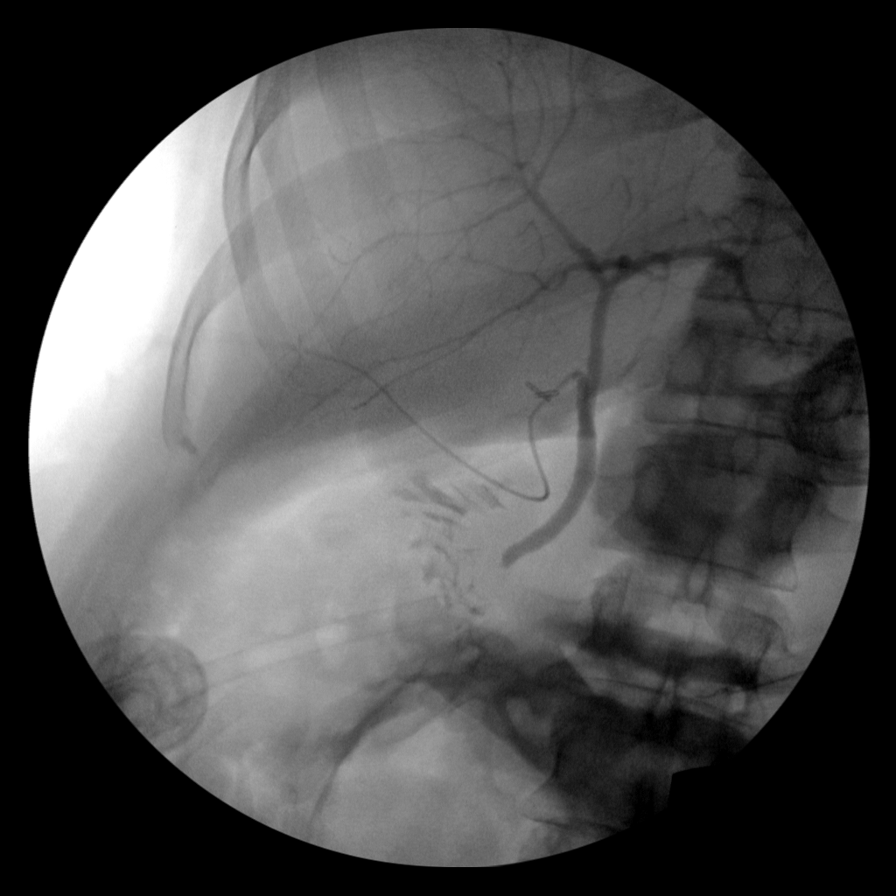
[im 2/2]
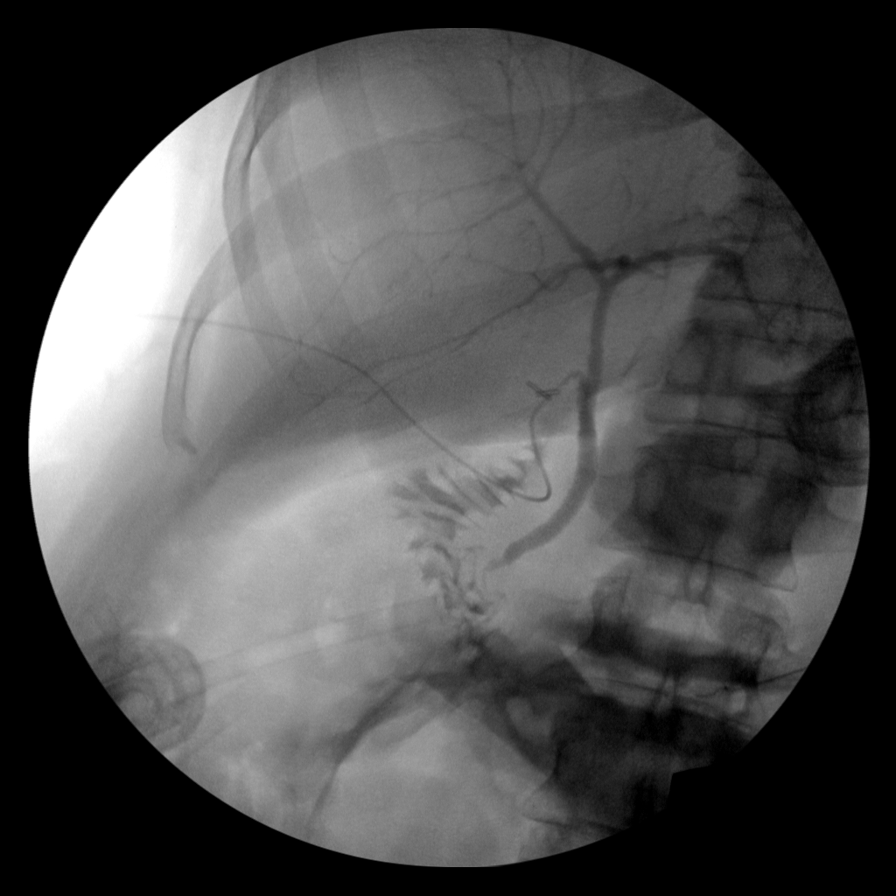
[im 2/2]
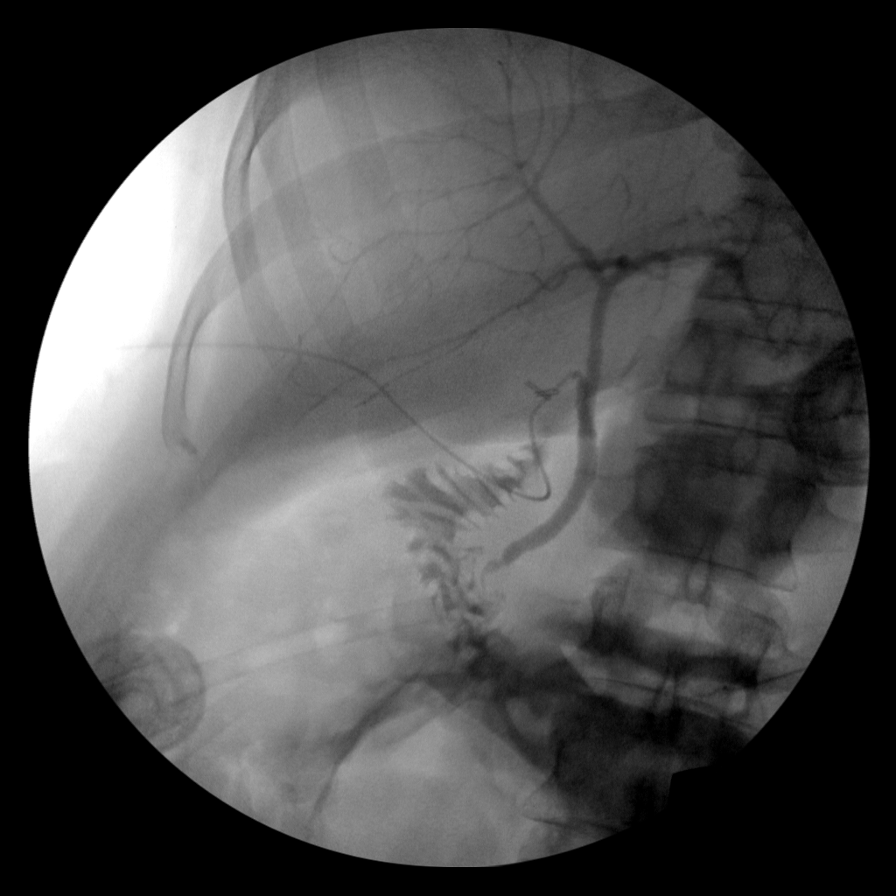
[im 2/2]
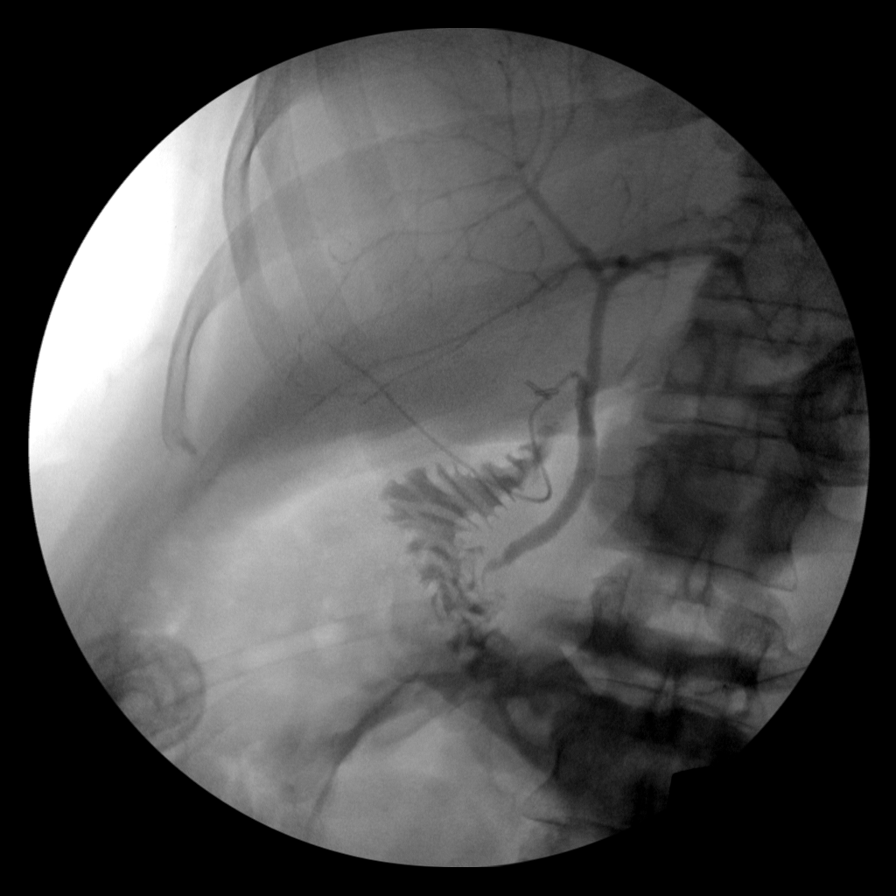

[7 of 7 positions shown; findings below may reference images not displayed]

FINDINGS: Contrast fills the biliary tree and duodenum without filling defects
in the common bile duct.
IMPRESSION: Patent biliary tree.

## 2018-09-19 MED ORDER — NA SULFATE-K SULFATE-MG SULF 17.5-3.13-1.6 GM/177ML PO SOLN: 1.0000 | ORAL | 0 refills | Status: DC

## 2018-09-19 MED ORDER — HYDROCORTISONE ACETATE 30 MG RE SUPP: 1.0000 | Freq: Every day | RECTAL | 3 refills | Status: DC

## 2018-09-19 MED ORDER — PEG 3350-KCL-NA BICARB-NACL 420 G PO SOLR: 4000.0000 mL | Freq: Once | ORAL | 0 refills | Status: AC

## 2018-09-19 MED ORDER — HYDROCORTISONE 2.5 % RE CREA: 1.0000 "application " | TOPICAL_CREAM | Freq: Two times a day (BID) | RECTAL | 1 refills | Status: DC

## 2018-09-19 NOTE — Telephone Encounter (Signed)
Pharmacy calling regarding suprep, copay is $50 with coupon, pt wants to know if there is something more affordable.

## 2018-09-19 NOTE — Telephone Encounter (Signed)
Pt called and wanting to know if a different prep can be sent in the amt was too high

## 2018-09-19 NOTE — Telephone Encounter (Signed)
Spoke with patient she states the suprep is too expensive even with the coupon. She prefers something cheaper like golytely. Golytely called into [facility]. Patient also states she could not afford the suppositories but she will use OTC prep H suppositories.

## 2018-09-19 NOTE — Progress Notes (Addendum)
Subjective:    Patient ID: Linda Wang, female    DOB: 1950/10/24, 68 y.o.   MRN: [id number]  HPI Linda Wang is a pleasant 68 year old white female, previously known to Dr. Verl Blalock who was last seen in our office in 2014.  She is requesting to be established with Dr. Hilarie Fredrickson.  She comes in with complaints of recent rectal discomfort and bleeding.  She says her bowel movements have been very regular.  She has no complaints of abdominal pain.  She is status post sigmoid colectomy in 2014 for severe diverticulosis, she is also had a ventral hernia repair with mesh.  She had cholecystectomy done in 2019. Colonoscopy was done in April 2014 per Dr. Sharlett Iles with finding of severe diverticulosis of the left and sigmoid colon with significant tortuosity and narrowing of the lumen.  Exam was otherwise unremarkable with no polyps.  She was noted to have internal hemorrhoids.  She was referred to surgery for sigmoid colectomy after that colonoscopy.  She does have prior history of adenomatous polyps. Patient states today that she was seen by a Dr. Palmer/surgery at [facility] on 08/31/2018 is referred there by her PCP for her hemorrhoidal symptoms.  On exam she was noted to have no evidence of anal fissure or perirectal abscess, anoscopy was not successful secondary to excess stool in the anal canal.  She was noted to have small external hemorrhoids that were noninflamed, and unable to visualize internal hemorrhoids.  She was advised to see her gastroenterologist for follow-up colonoscopy. She was given a prescription for Proctofoam but says that was too expensive and she did not get that. She has been doing sitz bath's which have been helpful.  She says when she has a bowel movement she will usually have hemorrhoids protruding she can feel with wiping.  It is uncomfortable to try to reduce them.  She says symptoms have improved over the past 2 weeks and she was seen by surgery. Family history is positive for  colon cancer in her father.  Review of Systems Pertinent positive and negative review of systems were noted in the above HPI section.  All other review of systems was otherwise negative.  Outpatient Encounter Medications as of 09/19/2018  Medication Sig   acetaminophen (TYLENOL) 325 MG tablet Take 650 mg by mouth every 6 (six) hours as needed.   albuterol (PROVENTIL HFA) 108 (90 BASE) MCG/ACT inhaler Inhale 2 puffs into the lungs 2 (two) times daily. Not using   ALPRAZolam (XANAX) 0.5 MG tablet Take 0.25-0.5 mg by mouth 3 (three) times daily as needed for anxiety.    Ascorbic Acid (VITAMIN C PO) Take 500 mg by mouth daily.    aspirin EC 81 MG tablet Take 81 mg by mouth daily.   carvedilol (COREG) 6.25 MG tablet Take 6.25 mg by mouth 2 (two) times daily with a meal.   estradiol (ESTRACE) 1 MG tablet Take 1 mg by mouth daily.    Fiber POWD Take 1 Scoop by mouth daily.   levothyroxine (SYNTHROID, LEVOTHROID) 88 MCG tablet Take 88 mcg by mouth daily before breakfast.   Omega-3 Fatty Acids (FISH OIL) 1200 MG CAPS Take 1 capsule by mouth daily.   Polyethyl Glycol-Propyl Glycol (SYSTANE OP) Place 1 drop into the left eye 3 (three) times daily.   sodium chloride (OCEAN) 0.65 % SOLN nasal spray Place 1 spray into both nostrils as needed for congestion.   vitamin E 400 UNIT capsule Take 800 Units by mouth daily.  hydrocortisone (ANUSOL-HC) 2.5 % rectal cream Place 1 application rectally 2 (two) times daily.   HYDROCORTISONE ACE, RECTAL, 30 MG SUPP Place 1 suppository (30 mg total) rectally at bedtime.   Na Sulfate-K Sulfate-Mg Sulf (SUPREP BOWEL PREP KIT) 17.5-3.13-1.6 GM/177ML SOLN Take 1 kit by mouth as directed.   [DISCONTINUED] gabapentin (NEURONTIN) 300 MG capsule Take 1 capsule (300 mg total) by mouth every 8 (eight) hours as needed (PAIN).   [DISCONTINUED] ibuprofen (ADVIL,MOTRIN) 200 MG tablet Take 600 mg by mouth at bedtime as needed for moderate pain.   [DISCONTINUED]  methocarbamol (ROBAXIN) 500 MG tablet Take 1-2 tablets (500-1,000 mg total) by mouth every 6 (six) hours as needed for muscle spasms.   [DISCONTINUED] ondansetron (ZOFRAN ODT) 8 MG disintegrating tablet Take 1 tablet (8 mg total) by mouth every 8 (eight) hours as needed for nausea or vomiting.   [DISCONTINUED] SF 5000 PLUS 1.1 % CREA dental cream Place 1 application onto teeth at bedtime.   No facility-administered encounter medications on file as of 09/19/2018.    Allergies  Allergen Reactions   Morphine And Related Other (See Comments)    Headaches    Other     Unable to tolerate NOSE SPRAYS: causes her throat to swell   Statins     myalgia   Sulfonamide Derivatives Hives   Tramadol Other (See Comments)    HEADACHES   Patient Active Problem List   Diagnosis Date Noted   Chronic cholecystitis with calculus s/p lap cholecystectomy 01/13/2018 01/12/2018   Ventral incisional hernia 04/29/2016   Incisional hernia, without obstruction or gangrene 04/28/2016   Hemorrhoids, internal, with bleeding 11/22/2012   Seasonal and perennial allergic rhinitis 05/13/2012   History of thyroid cancer 09/20/2011   REFLUX, ESOPHAGEAL 09/10/2009   LUQ PAIN 08/26/2009   ADENOMATOUS COLONIC POLYP 08/05/2009   DIVERTICULOSIS OF COLON 08/05/2009   Abdominal pain, generalized 08/05/2009   HYPOTHYROIDISM 08/01/2009   HYPERCHOLESTEROLEMIA 08/01/2009   ANXIETY DEPRESSION 08/01/2009   HYPERTENSION 08/01/2009   Social History   Socioeconomic History   Marital status: Single    Spouse name: Not on file   Number of children: 1   Years of education: Not on file   Highest education level: Not on file  Occupational History   Occupation: school Surveyor, quantity: [place] resource strain: Not on file   Food insecurity:    Worry: Not on file    Inability: Not on file   Transportation needs:    Medical: Not on file    Non-medical:  Not on file  Tobacco Use   Smoking status: Never Smoker   Smokeless tobacco: Never Used  Substance and Sexual Activity   Alcohol use: No   Drug use: No   Sexual activity: Not Currently    Birth control/protection: Surgical  Lifestyle   Physical activity:    Days per week: Not on file    Minutes per session: Not on file   Stress: Not on file  Relationships   Social connections:    Talks on phone: Not on file    Gets together: Not on file    Attends religious service: Not on file    Active member of club or organization: Not on file    Attends meetings of clubs or organizations: Not on file    Relationship status: Not on file   Intimate partner violence:    Fear of current or ex partner: Not on file  Emotionally abused: Not on file    Physically abused: Not on file    Forced sexual activity: Not on file  Other Topics Concern   Not on file  Social History Narrative   Not on file    Ms. Monfils's family history includes Bladder Cancer in her father; Colon cancer in her father; Colon polyps in her brother; Colonic polyp in her brother; Diabetes in her mother; Heart disease in her mother; Prostate cancer in her brother and brother; Thyroid cancer in her mother.      Objective:    Vitals:   09/19/18 0849  BP: (!) 176/98  Pulse: 84  Temp: 97.6 F (36.4 C)    Physical Exam; well-developed older white female in no acute distress, pleasant.  Temp 97 6, Height 5 Foot 2, Weight 166, BMI 29.8 HEENT; nontraumatic normocephalic EOMI PERRLA sclera anicteric, oral mucosa moist, Cardiovascular; regular rate and rhythm with S1-S2 no murmur rub or gallop, Pulmonary ;clear bilaterally, Abdomen; soft,, nontender, nondistended, there is no palpable mass or hepatosplenomegaly, bowel sounds are present as a midline incisional scar.  Rectal; exam not done patient had seen a colorectal surgeon last week.  Extremities; no clubbing cyanosis or edema skin warm and dry, Neuropsych ;alert and  oriented, grossly nonfocal mood and affect appropriate       Assessment & Plan:   #21 68 year old white female with recent rectal pain/discomfort and intermittent hematochezia.  Symptoms very likely secondary to inflamed internal hemorrhoids, also noted to have noninflamed external hemorrhoids on recent exam. Cannot rule out occult colon lesion.  #2 family history of colon cancer in patient's father #3 status post sigmoid colectomy 2014 for severe sigmoid diverticulosis with significant tortuosity and narrowing #4.  Remote history of adenomatous polyps  no polyps noted at colonoscopy 2014 #5.  Status post cholecystectomy 2019 #6.  History of thyroid CA status post thyroidectomy Status post ventral hernia repair with mesh #7.  Hypertension  Plan; Patient will be scheduled for colonoscopy with Dr. Hilarie Fredrickson.  Procedure was discussed in detail with the patient including indications risks and benefits and she is agreeable to proceed. We briefly discussed in office hemorrhoidal banding should her bleeding be proven to be secondary to internal hemorrhoids.  She would like to have this done if indicated knows that this will be done at a separate setting from the colonoscopy. Start RectiCare complete 3- 4 times daily Anusol HC/hydrocortisone suppository nightly x2 weeks then as needed.  Shakeerah Gradel Genia Harold PA-C 09/19/2018   Cc: Karleen Hampshire., MD/ Dr Alphonzo Dublin  Addendum: Reviewed and agree with assessment and management plan. Pyrtle, Lajuan Lines, MD

## 2018-09-19 NOTE — Patient Instructions (Addendum)
Please purchase the following medications over the counter and take as directed:  Recticare cream 3-4 times daily  Hydrocortisone suppository at bedtime for 2 weeks then as needed   You have been scheduled for a colonoscopy. Please follow written instructions given to you at your visit today.  Please pick up your prep supplies at the pharmacy within the next 1-3 days. If you use inhalers (even only as needed), please bring them with you on the day of your procedure. Your physician has requested that you go to www.startemmi.com and enter the access code given to you at your visit today. This web site gives a general overview about your procedure. However, you should still follow specific instructions given to you by our office regarding your preparation for the procedure.

## 2018-09-20 NOTE — Telephone Encounter (Signed)
Ok, thank you

## 2018-10-03 ENCOUNTER — Encounter: Payer: Medicare Other | Admitting: Internal Medicine

## 2019-01-29 ENCOUNTER — Telehealth: Payer: Self-pay | Admitting: Physician Assistant

## 2019-01-29 ENCOUNTER — Other Ambulatory Visit: Payer: Self-pay

## 2019-01-29 NOTE — Telephone Encounter (Signed)
There is a referral for pt to have a colonoscopy.  Recall colon for 2024.  Pt reported having internal and external hemorrhoids.  Pt stated that previous OV with Amy Esterwood, pt was advised to have a colonoscopy.  Please advise.

## 2019-01-29 NOTE — Telephone Encounter (Signed)
Patient wants to get the colonoscopy rescheduled asap. Diagnosis rectal bleeding. She had to wait for this to be scheduled due to the pandemic.  Written instructions reprinted and dates corrected. Urgent referral sent to have insurance notified.

## 2019-01-31 ENCOUNTER — Other Ambulatory Visit: Payer: Self-pay | Admitting: Internal Medicine

## 2019-01-31 ENCOUNTER — Telehealth: Payer: Self-pay | Admitting: Internal Medicine

## 2019-01-31 DIAGNOSIS — E890 Postprocedural hypothyroidism: Secondary | ICD-10-CM

## 2019-01-31 NOTE — Telephone Encounter (Signed)

## 2019-02-01 ENCOUNTER — Encounter: Payer: Self-pay | Admitting: Internal Medicine

## 2019-02-01 ENCOUNTER — Ambulatory Visit (AMBULATORY_SURGERY_CENTER): Payer: Medicare Other | Admitting: Internal Medicine

## 2019-02-01 ENCOUNTER — Other Ambulatory Visit: Payer: Self-pay

## 2019-02-01 VITALS — BP 104/66 | HR 78 | Temp 98.9°F | Resp 16 | Ht 62.5 in | Wt 166.0 lb

## 2019-02-01 DIAGNOSIS — K635 Polyp of colon: Secondary | ICD-10-CM

## 2019-02-01 DIAGNOSIS — Z800 Family history of malignant neoplasm of digestive organs: Secondary | ICD-10-CM | POA: Diagnosis not present

## 2019-02-01 DIAGNOSIS — D122 Benign neoplasm of ascending colon: Secondary | ICD-10-CM

## 2019-02-01 DIAGNOSIS — Z86010 Personal history of colonic polyps: Secondary | ICD-10-CM

## 2019-02-01 DIAGNOSIS — D123 Benign neoplasm of transverse colon: Secondary | ICD-10-CM

## 2019-02-01 MED ORDER — SODIUM CHLORIDE 0.9 % IV SOLN: 500.0000 mL | Freq: Once | INTRAVENOUS | Status: DC

## 2019-02-01 NOTE — Op Note (Signed)
[facility] Patient Name: Linda Wang Procedure Date: 02/01/2019 10:18 AM MRN: [id number] Endoscopist: Jerene Bears , MD Age: 68 Referring MD:  Date of Birth: 06-Feb-1951 Gender: Female Account #: [id number] Procedure:                Colonoscopy Indications:              High risk colon cancer surveillance: Personal                            history of non-advanced adenoma, Family history of                            colon cancer in a first-degree relative before age                            40 years, Last colonoscopy: April 2014 Medicines:                Monitored Anesthesia Care Procedure:                Pre-Anesthesia Assessment:                           - Prior to the procedure, a History and Physical                            was performed, and patient medications and                            allergies were reviewed. The patient's tolerance of                            previous anesthesia was also reviewed. The risks                            and benefits of the procedure and the sedation                            options and risks were discussed with the patient.                            All questions were answered, and informed consent                            was obtained. Prior Anticoagulants: The patient has                            taken no previous anticoagulant or antiplatelet                            agents. ASA Grade Assessment: II - A patient with                            mild systemic disease. After reviewing the risks  and benefits, the patient was deemed in                            satisfactory condition to undergo the procedure.                           After obtaining informed consent, the colonoscope                            was passed under direct vision. Throughout the                            procedure, the patient's blood pressure, pulse, and                            oxygen saturations were  monitored continuously. The                            Colonoscope was introduced through the anus and                            advanced to the cecum, identified by appendiceal                            orifice and ileocecal valve. The colonoscopy was                            performed without difficulty. The patient tolerated                            the procedure well. The quality of the bowel                            preparation was good. The ileocecal valve,                            appendiceal orifice, and rectum were photographed. Scope In: 10:26:24 AM Scope Out: 10:41:31 AM Scope Withdrawal Time: 0 hours 12 minutes 30 seconds  Total Procedure Duration: 0 hours 15 minutes 7 seconds  Findings:                 The digital rectal exam was normal.                           A 3 mm polyp was found in the ascending colon. The                            polyp was sessile. The polyp was removed with a                            cold snare. Resection and retrieval were complete.                           A 2 mm polyp was found  in the transverse colon. The                            polyp was sessile. The polyp was removed with a                            cold snare. Resection and retrieval were complete.                           There was evidence of a prior end-to-end                            colo-colonic anastomosis in the sigmoid colon. This                            was patent and was characterized by healthy                            appearing mucosa.                           Multiple small and large-mouthed diverticula were                            found in the descending colon and transverse colon.                           Internal hemorrhoids were found during                            retroflexion. The hemorrhoids were medium-sized. Complications:            No immediate complications. Estimated Blood Loss:     Estimated blood loss was minimal. Impression:                - One 3 mm polyp in the ascending colon, removed                            with a cold snare. Resected and retrieved.                           - One 2 mm polyp in the transverse colon, removed                            with a cold snare. Resected and retrieved.                           - Patent end-to-end colo-colonic anastomosis,                            characterized by healthy appearing mucosa.                           - Diverticulosis in the descending colon and in the  transverse colon.                           - Internal hemorrhoids. Recommendation:           - Patient has a contact number available for                            emergencies. The signs and symptoms of potential                            delayed complications were discussed with the                            patient. Return to normal activities tomorrow.                            Written discharge instructions were provided to the                            patient.                           - Resume previous diet.                           - Continue present medications.                           - Await pathology results.                           - Repeat colonoscopy is recommended. The                            colonoscopy date will be determined after pathology                            results from today's exam become available for                            review. Jerene Bears, MD 02/01/2019 10:46:02 AM This report has been signed electronically.

## 2019-02-01 NOTE — Patient Instructions (Signed)
Read all handouts given to you by your recovery room nurse.  YOU HAD AN ENDOSCOPIC PROCEDURE TODAY AT [facility]:   Refer to the procedure report that was given to you for any specific questions about what was found during the examination.  If the procedure report does not answer your questions, please call your gastroenterologist to clarify.  If you requested that your care partner not be given the details of your procedure findings, then the procedure report has been included in a sealed envelope for you to review at your convenience later.  YOU SHOULD EXPECT: Some feelings of bloating in the abdomen. Passage of more gas than usual.  Walking can help get rid of the air that was put into your GI tract during the procedure and reduce the bloating. If you had a lower endoscopy (such as a colonoscopy or flexible sigmoidoscopy) you may notice spotting of blood in your stool or on the toilet paper. If you underwent a bowel prep for your procedure, you may not have a normal bowel movement for a few days.  Please Note:  You might notice some irritation and congestion in your nose or some drainage.  This is from the oxygen used during your procedure.  There is no need for concern and it should clear up in a day or so.  SYMPTOMS TO REPORT IMMEDIATELY:   Following lower endoscopy (colonoscopy or flexible sigmoidoscopy):  Excessive amounts of blood in the stool  Significant tenderness or worsening of abdominal pains  Swelling of the abdomen that is new, acute  Fever of 100F or higher   Black, tarry-looking stools  For urgent or emergent issues, a gastroenterologist can be reached at any hour by calling [phone].   DIET:  We do recommend a small meal at first, but then you may proceed to your regular diet.  Drink plenty of fluids but you should avoid alcoholic beverages for 24 hours. Try to increase the fiber in your diet, and drink plenty of water.  ACTIVITY:  You should plan  to take it easy for the rest of today and you should NOT DRIVE or use heavy machinery until tomorrow (because of the sedation medicines used during the test).    FOLLOW UP: Our staff will call the number listed on your records 48-72 hours following your procedure to check on you and address any questions or concerns that you may have regarding the information given to you following your procedure. If we do not reach you, we will leave a message.  We will attempt to reach you two times.  During this call, we will ask if you have developed any symptoms of COVID 19. If you develop any symptoms (ie: fever, flu-like symptoms, shortness of breath, cough etc.) before then, please call [phone].  If you test positive for Covid 19 in the 2 weeks post procedure, please call and report this information to [place].    If any biopsies were taken you will be contacted by phone or by letter within the next 1-3 weeks.  Please call us at [phone] if you have not heard about the biopsies in 3 weeks.    SIGNATURES/CONFIDENTIALITY: You and/or your care partner have signed paperwork which will be entered into your electronic medical record.  These signatures attest to the fact that that the information above on your After Visit Summary has been reviewed and is understood.  Full responsibility of the confidentiality of this discharge information lies with you and/or  your care-partner. 

## 2019-02-01 NOTE — Addendum Note (Signed)
Addended by: Ernestine Conrad D on: 02/01/2019 04:45 PM   Modules accepted: Orders

## 2019-02-01 NOTE — Progress Notes (Signed)
Called to room to assist during endoscopic procedure.  Patient ID and intended procedure confirmed with present staff. Received instructions for my participation in the procedure from the performing physician.  

## 2019-02-01 NOTE — Progress Notes (Signed)
Temp by [place], Vitals by CW

## 2019-02-01 NOTE — Progress Notes (Signed)
Report given to PACU, vss 

## 2019-02-05 ENCOUNTER — Telehealth: Payer: Self-pay

## 2019-02-05 ENCOUNTER — Encounter: Payer: Self-pay | Admitting: Internal Medicine

## 2019-02-05 NOTE — Telephone Encounter (Signed)
  Follow up Call-  Call back number 02/01/2019  Post procedure Call Back phone  # [phone]  Permission to leave phone message Yes  Some recent data might be hidden     Patient questions:  Do you have a fever, pain , or abdominal swelling? No. Pain Score  0 *  Have you tolerated food without any problems? Yes.    Have you been able to return to your normal activities? Yes.    Do you have any questions about your discharge instructions: Diet   No. Medications  No. Follow up visit  No.  Do you have questions or concerns about your Care? No.  Actions: * If pain score is 4 or above: No action needed, pain <4. 1. Have you developed a fever since your procedure? no  2.   Have you had an respiratory symptoms (SOB or cough) since your procedure? no  3.   Have you tested positive for COVID 19 since your procedure no  4.   Have you had any family members/close contacts diagnosed with the COVID 19 since your procedure?  no   If yes to any of these questions please route to Joylene John, RN and Alphonsa Gin, Therapist, sports.

## 2019-02-28 ENCOUNTER — Encounter: Payer: Self-pay | Admitting: *Deleted

## 2019-03-06 ENCOUNTER — Encounter: Payer: Medicare Other | Admitting: Gastroenterology

## 2019-03-26 ENCOUNTER — Ambulatory Visit (HOSPITAL_COMMUNITY)
Admission: RE | Admit: 2019-03-26 | Discharge: 2019-03-26 | Disposition: A | Discharge status: Home / Self Care | Payer: Medicare Other | Source: Ambulatory Visit | Attending: Internal Medicine | Admitting: Internal Medicine

## 2019-03-26 ENCOUNTER — Other Ambulatory Visit: Payer: Self-pay

## 2019-03-26 DIAGNOSIS — E890 Postprocedural hypothyroidism: Secondary | ICD-10-CM | POA: Insufficient documentation

## 2019-03-27 ENCOUNTER — Ambulatory Visit (INDEPENDENT_AMBULATORY_CARE_PROVIDER_SITE_OTHER): Payer: Medicare Other | Admitting: Internal Medicine

## 2019-03-27 VITALS — BP 130/70 | HR 76 | Wt 165.8 lb

## 2019-03-27 DIAGNOSIS — K648 Other hemorrhoids: Secondary | ICD-10-CM

## 2019-03-27 NOTE — Patient Instructions (Signed)
HEMORRHOID BANDING PROCEDURE    FOLLOW-UP CARE   1. The procedure you have had should have been relatively painless since the banding of the area involved does not have nerve endings and there is no pain sensation.  The rubber band cuts off the blood supply to the hemorrhoid and the band may fall off as soon as 48 hours after the banding (the band may occasionally be seen in the toilet bowl following a bowel movement). You may notice a temporary feeling of fullness in the rectum which should respond adequately to plain Tylenol or Motrin.  2. Following the banding, avoid strenuous exercise that evening and resume full activity the next day.  A sitz bath (soaking in a warm tub) or bidet is soothing, and can be useful for cleansing the area after bowel movements.     3. To avoid constipation, take two tablespoons of natural wheat bran, natural oat bran, flax, Benefiber or any over the counter fiber supplement and increase your water intake to 7-8 glasses daily.    4. Unless you have been prescribed anorectal medication, do not put anything inside your rectum for two weeks: No suppositories, enemas, fingers, etc.  5. Occasionally, you may have more bleeding than usual after the banding procedure.  This is often from the untreated hemorrhoids rather than the treated one.  Don't be concerned if there is a tablespoon or so of blood.  If there is more blood than this, lie flat with your bottom higher than your head and apply an ice pack to the area. If the bleeding does not stop within a half an hour or if you feel faint, call our office at [phone] or go to the emergency room.  6. Problems are not common; however, if there is a substantial amount of bleeding, severe pain, chills, fever or difficulty passing urine (very rare) or other problems, you should call us at [phone] or report to the nearest emergency room.  7. Do not stay seated continuously for more than 2-3 hours for a day or two  after the procedure.  Tighten your buttock muscles 10-15 times every two hours and take 10-15 deep breaths every 1-2 hours.  Do not spend more than a few minutes on the toilet if you cannot empty your bowel; instead re-visit the toilet at a later time.    If you are age 104 or older, your body mass index should be between 23-30. Your Body mass index is 29.84 kg/m. If this is out of the aforementioned range listed, please consider follow up with your Primary Care Provider.  If you are age 78 or younger, your body mass index should be between 19-25. Your Body mass index is 29.84 kg/m. If this is out of the aformentioned range listed, please consider follow up with your Primary Care Provider.

## 2019-03-28 NOTE — Progress Notes (Signed)
  Karna Dupes presents today to discuss symptomatic internal hemorrhoids.    She came for colonoscopy which I performed on 02/01/2019.  This revealed medium sized internal hemorrhoids.  2 small polyps were removed which were not adenomatous.  There was diverticulosis seen.  She reports that she has had longstanding issues with internal hemorrhoids.  For her this includes periodic rectal bleeding and some prolapse symptoms.  Her hemorrhoids bother her at least 4-5 times per year and she treats this with sits baths and RectiCare.  She is also been using an over-the-counter fiber supplement which helps with her constipation.  She also notices when she eats vegetables and is more active by walking her constipation also improves.   PROCEDURE NOTE:  The patient presents with symptomatic grade 2 internal hemorrhoids, requesting rubber band ligation of her hemorrhoidal disease.  All risks, benefits and alternative forms of therapy were described and informed consent was obtained.   The anorectum was pre-medicated with 0.125% nitroglycerin ointment The decision was made to band the LL internal hemorrhoid, and the [place] was used to perform band ligation without complication.   Digital anorectal examination was then performed to assure proper positioning of the band, and to adjust the banded tissue as required.  The patient was discharged home without pain or other issues.  Dietary and behavioral recommendations were given and along with follow-up instructions.      The patient will return as scheduled for follow-up and possible additional banding as required. No complications were encountered and the patient tolerated the procedure well.

## 2019-05-01 ENCOUNTER — Encounter: Payer: Self-pay | Admitting: Internal Medicine

## 2019-05-01 ENCOUNTER — Other Ambulatory Visit: Payer: Self-pay

## 2019-05-01 ENCOUNTER — Ambulatory Visit (INDEPENDENT_AMBULATORY_CARE_PROVIDER_SITE_OTHER): Payer: Medicare Other | Admitting: Internal Medicine

## 2019-05-01 VITALS — BP 130/82 | HR 81 | Ht 62.5 in | Wt 161.0 lb

## 2019-05-01 DIAGNOSIS — K648 Other hemorrhoids: Secondary | ICD-10-CM

## 2019-05-01 NOTE — Progress Notes (Signed)
Linda Wang presents today for additional banding for symptomatic internal hemorrhoids. Initial hemorrhoidal banding performed on 03/27/2019 to the left lateral internal hemorrhoid. Symptoms prior to banding therapy include rectal bleeding, prolapse.  Separately she has noticed some intermittent sharp pain lasting only a few seconds in her abdomen.  This comes and goes.  Does not relate to eating or bowel movement or movement.  She wonders if this is related to previously placed abdominal mesh for umbilical hernia repair.  PROCEDURE NOTE:  The patient presents with symptomatic grade 2 internal hemorrhoids, requesting rubber band ligation of her hemorrhoidal disease.  All risks, benefits and alternative forms of therapy were described and informed consent was obtained.   The anorectum was pre-medicated with 0.125% nitroglycerin ointment The decision was made to band the RA (LL banded on 03/27/2019) internal hemorrhoid, and the [place] was used to perform band ligation without complication.   Digital anorectal examination was then performed to assure proper positioning of the band, and to adjust the banded tissue as required.  The patient was discharged home without pain or other issues.  Dietary and behavioral recommendations were given and along with follow-up instructions.     The following adjunctive treatments were recommended: She will contact Dr. Harlow Asa who performed her umbilical hernia repair for his opinion regarding intermittent pain and could this relate to her abd discomfort.  The patient will return as scheduled for  follow-up and possible additional banding as required. No complications were encountered and the patient tolerated the procedure well.

## 2019-05-01 NOTE — Patient Instructions (Addendum)
You are scheduled for your 3rd hemorrhoidal banding on 05/24/2019 at 3:40 pm.   HEMORRHOID BANDING PROCEDURE    FOLLOW-UP CARE   1. The procedure you have had should have been relatively painless since the banding of the area involved does not have nerve endings and there is no pain sensation.  The rubber band cuts off the blood supply to the hemorrhoid and the band may fall off as soon as 48 hours after the banding (the band may occasionally be seen in the toilet bowl following a bowel movement). You may notice a temporary feeling of fullness in the rectum which should respond adequately to plain Tylenol or Motrin.  2. Following the banding, avoid strenuous exercise that evening and resume full activity the next day.  A sitz bath (soaking in a warm tub) or bidet is soothing, and can be useful for cleansing the area after bowel movements.     3. To avoid constipation, take two tablespoons of natural wheat bran, natural oat bran, flax, Benefiber or any over the counter fiber supplement and increase your water intake to 7-8 glasses daily.    4. Unless you have been prescribed anorectal medication, do not put anything inside your rectum for two weeks: No suppositories, enemas, fingers, etc.  5. Occasionally, you may have more bleeding than usual after the banding procedure.  This is often from the untreated hemorrhoids rather than the treated one.  Don't be concerned if there is a tablespoon or so of blood.  If there is more blood than this, lie flat with your bottom higher than your head and apply an ice pack to the area. If the bleeding does not stop within a half an hour or if you feel faint, call our office at [phone] or go to the emergency room.  6. Problems are not common; however, if there is a substantial amount of bleeding, severe pain, chills, fever or difficulty passing urine (very rare) or other problems, you should call us at [phone] or report to the nearest emergency  room.  7. Do not stay seated continuously for more than 2-3 hours for a day or two after the procedure.  Tighten your buttock muscles 10-15 times every two hours and take 10-15 deep breaths every 1-2 hours.  Do not spend more than a few minutes on the toilet if you cannot empty your bowel; instead re-visit the toilet at a later time.

## 2019-05-22 ENCOUNTER — Other Ambulatory Visit: Payer: Self-pay | Admitting: Internal Medicine

## 2019-05-22 DIAGNOSIS — Z1231 Encounter for screening mammogram for malignant neoplasm of breast: Secondary | ICD-10-CM

## 2019-05-24 ENCOUNTER — Encounter: Payer: Self-pay | Admitting: Internal Medicine

## 2019-05-24 ENCOUNTER — Other Ambulatory Visit: Payer: Self-pay

## 2019-05-24 ENCOUNTER — Ambulatory Visit (INDEPENDENT_AMBULATORY_CARE_PROVIDER_SITE_OTHER): Payer: Medicare Other | Admitting: Internal Medicine

## 2019-05-24 VITALS — BP 132/86 | HR 84 | Temp 97.9°F | Ht 62.0 in | Wt 163.1 lb

## 2019-05-24 DIAGNOSIS — K648 Other hemorrhoids: Secondary | ICD-10-CM

## 2019-05-24 NOTE — Patient Instructions (Signed)

## 2019-05-25 NOTE — Progress Notes (Signed)
Linda Wang is a 68 year old female who presents for additional banding for symptomatic internal hemorrhoids.  Initial hemorrhoidal banding on 03/27/2019 and then 05/01/2019. Symptoms prior to treatment included rectal bleeding and prolapse  She had an excellent treatment response and has had no further bleeding since her last banding   PROCEDURE NOTE:  The patient presents with symptomatic grade 2 internal hemorrhoids, requesting rubber band ligation of her hemorrhoidal disease.  All risks, benefits and alternative forms of therapy were described and informed consent was obtained.   The anorectum was pre-medicated with 0.125% nitroglycerin ointment The decision was made to band the RP (LL and RA banded previously) internal hemorrhoid, and the [place] was used to perform band ligation without complication.   Digital anorectal examination was then performed to assure proper positioning of the band, and to adjust the banded tissue as required.  The patient was discharged home without pain or other issues.  Dietary and behavioral recommendations were given and along with follow-up instructions.     The patient will return as scheduled for  follow-up and possible additional banding as required. No complications were encountered and the patient tolerated the procedure well.

## 2019-07-06 HISTORY — PX: BREAST BIOPSY: SHX20

## 2019-07-16 ENCOUNTER — Telehealth: Payer: Self-pay | Admitting: Internal Medicine

## 2019-07-16 NOTE — Telephone Encounter (Signed)
Left message for pt to call back.  Spoke with pt and she is aware. Pt scheduled to see Alonza Bogus PA tomorrow at 2:30pm.

## 2019-07-16 NOTE — Telephone Encounter (Signed)
Left message for pt to call back.  Pt states her hemorrhoids flared up Thursday and she has been having burning and pain. She has been using HC cream that Nicoletta Ba PA prescribed last March and taking sitz baths along with applying ice to the area but they do not seem to be improving. Requested to be seen today but no available appointment today. PA appt available tomorrow. Please advise.

## 2019-07-16 NOTE — Telephone Encounter (Signed)
Had banding x 3, I wonder if this could be fissure? Ok for appt tomorrow Have her get RectiCare OTC and use per box instruction, this should help significantly with the pain

## 2019-07-17 ENCOUNTER — Encounter: Payer: Self-pay | Admitting: Gastroenterology

## 2019-07-17 ENCOUNTER — Ambulatory Visit (INDEPENDENT_AMBULATORY_CARE_PROVIDER_SITE_OTHER): Payer: Medicare Other | Admitting: Gastroenterology

## 2019-07-17 VITALS — BP 162/78 | HR 68 | Temp 97.8°F | Ht 62.0 in | Wt 165.2 lb

## 2019-07-17 DIAGNOSIS — K6289 Other specified diseases of anus and rectum: Secondary | ICD-10-CM

## 2019-07-17 DIAGNOSIS — K602 Anal fissure, unspecified: Secondary | ICD-10-CM

## 2019-07-17 MED ORDER — AMBULATORY NON FORMULARY MEDICATION: 0 refills | Status: DC

## 2019-07-17 NOTE — Patient Instructions (Signed)
We have sent a prescription for nitroglycerin 0.125% gel to [facility]. You should apply a pea size amount to your rectum three times daily x 6-8 weeks.  [facility]'s information is below: Address: [address], [place], [place] [address]  [phone]  *Please DO NOT go directly from our office to pick up this medication! Give the pharmacy 1 day to process the prescription as this is compounded and takes time to make.  Discontinue hydrocortisone.  You may use recticare over the counter.  If you are age 38 or older, your body mass index should be between 23-30. Your Body mass index is 30.22 kg/m. If this is out of the aforementioned range listed, please consider follow up with your Primary Care Provider.  If you are age 4 or younger, your body mass index should be between 19-25. Your Body mass index is 30.22 kg/m. If this is out of the aformentioned range listed, please consider follow up with your Primary Care Provider.

## 2019-07-17 NOTE — Progress Notes (Signed)
07/17/2019 Linda Wang [id number] 1950-12-13   HISTORY OF PRESENT ILLNESS: This is a 69 year old female who is a patient of Dr. Vena Rua. She comes in today with complaints of rectal pain that she thought was from external hemorrhoids. She says that she is not having any issues with moving her bowels, they are soft. She denies any bleeding. She says that she does not wipe or clean her bottom too aggressively. She says that her bottom is very painful. She has been taking sitz bath's with very hot water and then alternating with ice packs. She says that ice pack seems to feel better. She did get RectiCare and that does alleviate some of the pain for short time. She actually found hydrocortisone in her drawer yesterday and started using that. She says that it is not necessarily painful to have a bowel movement because her stools are soft, but is very painful after bowel movements.  Last colonoscopy was in July 2020 so she is up-to-date with that.  She has also had internal hemorrhoid banding by Dr. Hilarie Fredrickson.   Past Medical History:  Diagnosis Date  . Anxiety   . Aortic atherosclerosis ([place])   . Asthma    recent oral Prednisone use last dose 04-13-16  . Cataract    Bilateral  . DDD (degenerative disc disease), lumbar   . Depression   . Diverticulitis   . Diverticulosis of colon (without mention of hemorrhage)   . Dizziness    EVALUATED WITH MANY TESTS INCLUDING CT SCAN HEAD--NO PROBLEMS FOUND--PT STILL HAS OCCAS BOUT OF DIZZINESS.  . Gallstones   . GERD (gastroesophageal reflux disease)    noted on an upper GI endoscopy but does not require medication  . Hemorrhoids   . History of bronchitis   . Hyperlipidemia   . Hypertension   . Hypothyroidism   . Internal hemorrhoids   . Myositis   . OA (osteoarthritis)    generalized  . Obesity   . Seasonal allergies   . 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 Thyroid cancer ([place])    Thyroid  . TMJ (temporomandibular joint syndrome)    BILATERAL- uses  nightly mouthpiece.  . Tubular adenoma of colon 01/20/2000  . Umbilical hernia    Past Surgical History:  Procedure Laterality Date  . ABDOMINAL HYSTERECTOMY    . APPENDECTOMY    . CESAREAN SECTION    . CHOLECYSTECTOMY N/A 01/13/2018   Procedure: LAPAROSCOPIC CHOLECYSTECTOMY WITH INTRAOPERATIVE CHOLANGIOGRAM;  Surgeon: Armandina Gemma, MD;  Location: [facility];  Service: General;  Laterality: N/A;  . COLONOSCOPY  10/04/2012  . HAND NEUROPLASTY     decompression median nerve at carpal tunnel  . HEMORRHOID BANDING    . HEMORRHOID SURGERY     in office on 05/26/2015   . HEMORROIDECTOMY    . INSERTION OF MESH N/A 04/29/2016   Procedure: INSERTION OF MESH;  Surgeon: Armandina Gemma, MD;  Location: [facility];  Service: General;  Laterality: N/A;  . PARTIAL COLECTOMY N/A 10/12/2012   Procedure: SIGMOID COLECTOMY;  Surgeon: Earnstine Regal, MD;  Location: [facility];  Service: General;  Laterality: N/A;  . [place]    . THYROIDECTOMY, PARTIAL     Positive cancer-no further issues  . TUBAL LIGATION    . UPPER GI ENDOSCOPY    . VENTRAL HERNIA REPAIR N/A 04/29/2016   Procedure: LAPAROSCOPIC VENTRAL INCISIONAL  HERNIA;  Surgeon: Armandina Gemma, MD;  Location: [facility];  Service: General;  Laterality: N/A;  reports that she has never smoked. She has never used smokeless tobacco. She reports that she does not drink alcohol or use drugs. family history includes Bladder Cancer in her father; Colon cancer in her father; Colon polyps in her brother; Colonic polyp in her brother; Diabetes in her mother; Heart disease in her mother; Prostate cancer in her brother and brother; Thyroid cancer in her mother. Allergies  Allergen Reactions  . Morphine And Related Other (See Comments)    Headaches   . Other     Unable to tolerate NOSE SPRAYS: causes her throat to swell  . Statins     myalgia  . Sulfonamide Derivatives Hives  . Tramadol Other (See Comments)    HEADACHES      Outpatient Encounter Medications as of  07/17/2019  Medication Sig  . albuterol (PROVENTIL HFA) 108 (90 BASE) MCG/ACT inhaler Inhale 2 puffs into the lungs 2 (two) times daily. Not using  . ALPRAZolam (XANAX) 0.5 MG tablet Take 0.25-0.5 mg by mouth 3 (three) times daily as needed for anxiety.   . Ascorbic Acid (VITAMIN C PO) Take 500 mg by mouth daily.   . carvedilol (COREG) 6.25 MG tablet Take 6.25 mg by mouth 2 (two) times daily with a meal.  . Cholecalciferol (VITAMIN D3) 50 MCG (2000 UT) capsule Take 2,000 Units by mouth daily.  . cycloSPORINE (RESTASIS) 0.05 % ophthalmic emulsion 1 drop 2 (two) times daily.  Marland Kitchen estradiol (ESTRACE) 1 MG tablet Take 1 mg by mouth daily.   Marland Kitchen ezetimibe (ZETIA) 10 MG tablet Take by mouth.  . Fiber POWD Take 1 Scoop by mouth daily.  Marland Kitchen levothyroxine (SYNTHROID, LEVOTHROID) 88 MCG tablet Take 88 mcg by mouth daily before breakfast.  . Polyethyl Glycol-Propyl Glycol (SYSTANE OP) Place 1 drop into the left eye 3 (three) times daily.  . Probiotic CAPS Take 1 capsule by mouth daily.  . sodium chloride (OCEAN) 0.65 % SOLN nasal spray Place 1 spray into both nostrils as needed for congestion.  . vitamin E 400 UNIT capsule Take 800 Units by mouth daily.   No facility-administered encounter medications on file as of 07/17/2019.     REVIEW OF SYSTEMS  : All other systems reviewed and negative except where noted in the History of Present Illness.   PHYSICAL EXAM: BP (!) 162/78   Pulse 68   Temp 97.8 F (36.6 C)   Ht 5\' 2"  (1.575 m)   Wt 165 lb 4 oz (75 kg)   BMI 30.22 kg/m  General: Well developed white female in no acute distress Head: Normocephalic and atraumatic Eyes:  Sclerae anicteric, conjunctiva pink. Ears: Normal auditory acuity Lungs: Clear throughout to auscultation; no increased WOB. Heart: Regular rate and rhythm; no M/R/G. Abdomen: Soft, non-distended.  BS present.  Non-tender. Rectal:  Non-inflamed external hemorrhoids noted.  DRE did not reveal any masses, but was painful.  I believe  that I felt an anal fissure posteriorly that was very tender.  Light brown stool on exam glove, not hemocculted.  Musculoskeletal: Symmetrical with no gross deformities  Skin: No lesions on visible extremities Extremities: No edema  Neurological: Alert oriented x 4, grossly non-focal Psychological:  Alert and cooperative. Normal mood and affect  ASSESSMENT AND PLAN: *Rectal pain:  I think that she has an anal fissure.  Did not see one on exam and did not attempt anoscopy due to pain, but I am sure that I felt one posteriorly and she is giving history/acting like a fissure.  Will  treat with nitrol gel 0.125% BID-TID for 6-8 weeks.  Can continue to use recticare as well.  Discontinue hydrocortisone.  Can continue with sitz baths and or ice packs to the area.  Keep stools soft.  Avoid aggressive cleaning.  Call back if no improvement or worsening symptoms.   CC:  Karleen [place]., MD

## 2019-07-18 ENCOUNTER — Other Ambulatory Visit: Payer: Self-pay

## 2019-07-18 ENCOUNTER — Ambulatory Visit
Admission: RE | Admit: 2019-07-18 | Discharge: 2019-07-18 | Disposition: A | Discharge status: Home / Self Care | Payer: Medicare Other | Source: Ambulatory Visit | Attending: Internal Medicine | Admitting: Internal Medicine

## 2019-07-18 DIAGNOSIS — Z1231 Encounter for screening mammogram for malignant neoplasm of breast: Secondary | ICD-10-CM

## 2019-07-19 ENCOUNTER — Encounter: Payer: Self-pay | Admitting: Gastroenterology

## 2019-07-19 DIAGNOSIS — K602 Anal fissure, unspecified: Secondary | ICD-10-CM | POA: Insufficient documentation

## 2019-07-19 DIAGNOSIS — K6289 Other specified diseases of anus and rectum: Secondary | ICD-10-CM | POA: Insufficient documentation

## 2019-07-20 ENCOUNTER — Other Ambulatory Visit: Payer: Self-pay | Admitting: Internal Medicine

## 2019-07-20 DIAGNOSIS — R928 Other abnormal and inconclusive findings on diagnostic imaging of breast: Secondary | ICD-10-CM

## 2019-07-23 ENCOUNTER — Telehealth: Payer: Self-pay | Admitting: Gastroenterology

## 2019-07-23 NOTE — Telephone Encounter (Signed)
The pt was seen on 1/12 for Anal fissure and has been using recti care and nitroglycerin but that actually seems to make it worse.  She has used ice but that only helps for a short time.  Her stools are soft. She states the pain is not worse but is no better.  She says whenever she uses the recti care or the nitro it makes the pain more intense.  Please advise

## 2019-07-24 ENCOUNTER — Other Ambulatory Visit: Payer: Self-pay | Admitting: Obstetrics & Gynecology

## 2019-07-24 ENCOUNTER — Telehealth: Payer: Self-pay | Admitting: Gastroenterology

## 2019-07-24 NOTE — Telephone Encounter (Signed)
Alternate phone note

## 2019-07-24 NOTE — Telephone Encounter (Signed)
The meds do tend to burn until it starts to heal.  They can take a long time to heal.  I think that she just needs to stick with it for a while longer.

## 2019-07-24 NOTE — Telephone Encounter (Signed)
The pt has been advised and will call back if no better in a few weeks

## 2019-07-24 NOTE — Telephone Encounter (Signed)
Left message on machine to call back  

## 2019-07-25 ENCOUNTER — Other Ambulatory Visit: Payer: Self-pay | Admitting: Obstetrics & Gynecology

## 2019-07-25 DIAGNOSIS — R928 Other abnormal and inconclusive findings on diagnostic imaging of breast: Secondary | ICD-10-CM

## 2019-07-26 ENCOUNTER — Other Ambulatory Visit: Payer: Self-pay | Admitting: Obstetrics & Gynecology

## 2019-07-26 ENCOUNTER — Telehealth: Payer: Self-pay | Admitting: Gastroenterology

## 2019-07-26 ENCOUNTER — Other Ambulatory Visit: Payer: Self-pay

## 2019-07-26 ENCOUNTER — Ambulatory Visit
Admission: RE | Admit: 2019-07-26 | Discharge: 2019-07-26 | Disposition: A | Discharge status: Home / Self Care | Payer: Medicare Other | Source: Ambulatory Visit | Attending: Internal Medicine | Admitting: Internal Medicine

## 2019-07-26 DIAGNOSIS — R921 Mammographic calcification found on diagnostic imaging of breast: Secondary | ICD-10-CM

## 2019-07-26 DIAGNOSIS — R928 Other abnormal and inconclusive findings on diagnostic imaging of breast: Secondary | ICD-10-CM

## 2019-07-26 NOTE — Telephone Encounter (Signed)
The pt has been advised that the records are available to [facility].  The pt has been advised of the information and verbalized understanding.

## 2019-07-26 NOTE — Telephone Encounter (Signed)
Pt is scheduled to see Dr. Tera Helper at [facility] today at 2:00 PM and requested office notes sent to [place] [facility].

## 2019-07-26 NOTE — Progress Notes (Signed)
Addendum: Reviewed and agree with assessment and management plan. Gomer France M, MD  

## 2019-07-31 ENCOUNTER — Ambulatory Visit
Admission: RE | Admit: 2019-07-31 | Discharge: 2019-07-31 | Disposition: A | Discharge status: Home / Self Care | Payer: Medicare Other | Source: Ambulatory Visit | Attending: Obstetrics & Gynecology | Admitting: Obstetrics & Gynecology

## 2019-07-31 ENCOUNTER — Other Ambulatory Visit: Payer: Self-pay

## 2019-07-31 DIAGNOSIS — R921 Mammographic calcification found on diagnostic imaging of breast: Secondary | ICD-10-CM

## 2019-08-02 ENCOUNTER — Ambulatory Visit: Payer: Medicare Other | Admitting: Gastroenterology

## 2020-01-08 ENCOUNTER — Telehealth: Payer: Self-pay | Admitting: Gastroenterology

## 2020-01-08 NOTE — Telephone Encounter (Signed)
The pt has history of internal hemorrhoids and says she began to have pain on Saturday.  She is out of town and has been doing sitz baths (it helps some) She is keeping the area clean and dry and states her bowels are normal (no hard stools or diarrhea)  She was given nitroglycerin and recticare in the past but she says it makes the pain worse and the nitro gives her a headache.  I offered her an appt with app but we have no appts until August.  She tells me she has had surgery for the hemorrhoids.  I advised her to call the surgeon or go to urgent care (she is out of town) or go to PCP when she is back in town.  She will continue sitz baths until then.  She was advised to call if she changes her mind about an appt in August. The pt has been advised of the information and verbalized understanding.

## 2020-01-08 NOTE — Telephone Encounter (Signed)
Patient is returning your call.  

## 2020-01-08 NOTE — Telephone Encounter (Signed)
Left message on machine to call back  

## 2020-05-01 ENCOUNTER — Other Ambulatory Visit: Payer: Self-pay | Admitting: Obstetrics & Gynecology

## 2020-05-01 DIAGNOSIS — Z1231 Encounter for screening mammogram for malignant neoplasm of breast: Secondary | ICD-10-CM

## 2020-07-22 ENCOUNTER — Ambulatory Visit: Payer: Medicare Other

## 2020-09-22 ENCOUNTER — Inpatient Hospital Stay: Admission: RE | Admit: 2020-09-22 | Payer: Medicare Other | Source: Ambulatory Visit

## 2020-11-13 ENCOUNTER — Ambulatory Visit: Payer: Medicare Other

## 2020-11-26 ENCOUNTER — Ambulatory Visit: Payer: Medicare Other

## 2020-11-27 ENCOUNTER — Other Ambulatory Visit: Payer: Self-pay

## 2020-11-27 ENCOUNTER — Ambulatory Visit
Admission: RE | Admit: 2020-11-27 | Discharge: 2020-11-27 | Disposition: A | Discharge status: Home / Self Care | Payer: Medicare Other | Source: Ambulatory Visit | Attending: Obstetrics & Gynecology | Admitting: Obstetrics & Gynecology

## 2020-11-27 DIAGNOSIS — Z1231 Encounter for screening mammogram for malignant neoplasm of breast: Secondary | ICD-10-CM

## 2021-01-19 ENCOUNTER — Ambulatory Visit: Payer: Medicare Other

## 2021-07-30 ENCOUNTER — Other Ambulatory Visit: Payer: Self-pay | Admitting: Obstetrics & Gynecology

## 2021-07-30 ENCOUNTER — Other Ambulatory Visit: Payer: Self-pay | Admitting: Obstetrics and Gynecology

## 2021-07-30 DIAGNOSIS — Z1231 Encounter for screening mammogram for malignant neoplasm of breast: Secondary | ICD-10-CM

## 2021-10-15 ENCOUNTER — Other Ambulatory Visit (HOSPITAL_COMMUNITY): Payer: Self-pay | Admitting: Internal Medicine

## 2021-10-15 ENCOUNTER — Other Ambulatory Visit: Payer: Self-pay | Admitting: Internal Medicine

## 2021-10-15 DIAGNOSIS — Z85850 Personal history of malignant neoplasm of thyroid: Secondary | ICD-10-CM

## 2021-10-16 ENCOUNTER — Ambulatory Visit (HOSPITAL_COMMUNITY): Payer: Medicare Other

## 2021-10-20 ENCOUNTER — Ambulatory Visit (HOSPITAL_COMMUNITY): Payer: Medicare Other

## 2021-11-16 ENCOUNTER — Ambulatory Visit: Payer: Medicare Other

## 2021-11-16 ENCOUNTER — Ambulatory Visit (HOSPITAL_COMMUNITY): Payer: Medicare Other

## 2021-11-17 ENCOUNTER — Ambulatory Visit (HOSPITAL_COMMUNITY)
Admission: RE | Admit: 2021-11-17 | Discharge: 2021-11-17 | Disposition: A | Discharge status: Home / Self Care | Payer: Medicare Other | Source: Ambulatory Visit | Attending: Internal Medicine | Admitting: Internal Medicine

## 2021-11-17 ENCOUNTER — Ambulatory Visit: Payer: Medicare Other

## 2021-11-17 DIAGNOSIS — Z85850 Personal history of malignant neoplasm of thyroid: Secondary | ICD-10-CM | POA: Insufficient documentation

## 2021-12-01 ENCOUNTER — Ambulatory Visit: Payer: Medicare Other

## 2021-12-17 ENCOUNTER — Ambulatory Visit
Admission: RE | Admit: 2021-12-17 | Discharge: 2021-12-17 | Disposition: A | Discharge status: Home / Self Care | Payer: Medicare Other | Source: Ambulatory Visit | Attending: Obstetrics and Gynecology | Admitting: Obstetrics and Gynecology

## 2021-12-17 DIAGNOSIS — Z1231 Encounter for screening mammogram for malignant neoplasm of breast: Secondary | ICD-10-CM

## 2022-07-01 ENCOUNTER — Other Ambulatory Visit: Payer: Self-pay | Admitting: Obstetrics and Gynecology

## 2022-07-01 DIAGNOSIS — Z1231 Encounter for screening mammogram for malignant neoplasm of breast: Secondary | ICD-10-CM

## 2023-02-07 ENCOUNTER — Ambulatory Visit
Admission: RE | Admit: 2023-02-07 | Discharge: 2023-02-07 | Disposition: A | Discharge status: Home / Self Care | Payer: Medicare Other | Source: Ambulatory Visit | Attending: Obstetrics and Gynecology | Admitting: Obstetrics and Gynecology

## 2023-02-07 DIAGNOSIS — Z1231 Encounter for screening mammogram for malignant neoplasm of breast: Secondary | ICD-10-CM

## 2023-07-05 ENCOUNTER — Encounter (HOSPITAL_COMMUNITY): Payer: Self-pay | Admitting: Emergency Medicine

## 2023-07-05 ENCOUNTER — Emergency Department (HOSPITAL_COMMUNITY)
Admission: EM | Admit: 2023-07-05 | Discharge: 2023-07-06 | Discharge status: Against Medical Advice | Payer: Medicare Other | Attending: Emergency Medicine | Admitting: Emergency Medicine

## 2023-07-05 ENCOUNTER — Other Ambulatory Visit: Payer: Self-pay

## 2023-07-05 DIAGNOSIS — R110 Nausea: Secondary | ICD-10-CM | POA: Diagnosis present

## 2023-07-05 DIAGNOSIS — Z5321 Procedure and treatment not carried out due to patient leaving prior to being seen by health care provider: Secondary | ICD-10-CM | POA: Insufficient documentation

## 2023-07-05 LAB — CBC
HCT: 39.1 % (ref 36.0–46.0)
Hemoglobin: 12.9 g/dL (ref 12.0–15.0)
MCH: 30.8 pg (ref 26.0–34.0)
MCHC: 33 g/dL (ref 30.0–36.0)
MCV: 93.3 fL (ref 80.0–100.0)
Platelets: 252 10*3/uL (ref 150–400)
RBC: 4.19 MIL/uL (ref 3.87–5.11)
RDW: 12.4 % (ref 11.5–15.5)
WBC: 6.9 10*3/uL (ref 4.0–10.5)
nRBC: 0 % (ref 0.0–0.2)

## 2023-07-05 MED ORDER — ONDANSETRON 4 MG PO TBDP
4.0000 mg | ORAL_TABLET | Freq: Once | ORAL | Status: AC | PRN
  Administered 2023-07-05: 4 mg via ORAL
  Filled 2023-07-05: qty 1

## 2023-07-05 NOTE — ED Triage Notes (Signed)
Pt reports nausea after eating dinner tonight denies vomiting

## 2023-07-06 LAB — URINALYSIS, ROUTINE W REFLEX MICROSCOPIC
Bilirubin Urine: NEGATIVE
Glucose, UA: NEGATIVE mg/dL
Hgb urine dipstick: NEGATIVE
Ketones, ur: NEGATIVE mg/dL
Leukocytes,Ua: NEGATIVE
Nitrite: NEGATIVE
Protein, ur: NEGATIVE mg/dL
Specific Gravity, Urine: 1.023 (ref 1.005–1.030)
pH: 5 (ref 5.0–8.0)

## 2023-07-06 LAB — COMPREHENSIVE METABOLIC PANEL
ALT: 14 U/L (ref 0–44)
AST: 17 U/L (ref 15–41)
Albumin: 3.7 g/dL (ref 3.5–5.0)
Alkaline Phosphatase: 33 U/L — ABNORMAL LOW (ref 38–126)
Anion gap: 8 (ref 5–15)
BUN: 14 mg/dL (ref 8–23)
CO2: 22 mmol/L (ref 22–32)
Calcium: 8.8 mg/dL — ABNORMAL LOW (ref 8.9–10.3)
Chloride: 103 mmol/L (ref 98–111)
Creatinine, Ser: 0.69 mg/dL (ref 0.44–1.00)
GFR, Estimated: >60 mL/min (ref >60)
Glucose, Bld: 114 mg/dL — ABNORMAL HIGH (ref 70–99)
Potassium: 3.4 mmol/L — ABNORMAL LOW (ref 3.5–5.1)
Sodium: 133 mmol/L — ABNORMAL LOW (ref 135–145)
Total Bilirubin: 0.4 mg/dL (ref 0.0–1.2)
Total Protein: 7.6 g/dL (ref 6.5–8.1)

## 2023-07-06 LAB — LIPASE, BLOOD: Lipase: 29 U/L (ref 11–51)

## 2023-08-05 ENCOUNTER — Encounter: Payer: Self-pay | Admitting: Physician Assistant

## 2023-08-22 ENCOUNTER — Other Ambulatory Visit: Payer: Self-pay | Admitting: Internal Medicine

## 2023-08-22 DIAGNOSIS — Z1231 Encounter for screening mammogram for malignant neoplasm of breast: Secondary | ICD-10-CM

## 2023-09-06 ENCOUNTER — Other Ambulatory Visit (HOSPITAL_BASED_OUTPATIENT_CLINIC_OR_DEPARTMENT_OTHER): Payer: Self-pay | Admitting: Internal Medicine

## 2023-09-06 DIAGNOSIS — Z85850 Personal history of malignant neoplasm of thyroid: Secondary | ICD-10-CM

## 2023-09-16 ENCOUNTER — Ambulatory Visit (INDEPENDENT_AMBULATORY_CARE_PROVIDER_SITE_OTHER): Payer: Medicare Other | Admitting: Physician Assistant

## 2023-09-16 ENCOUNTER — Encounter: Payer: Self-pay | Admitting: Physician Assistant

## 2023-09-16 ENCOUNTER — Ambulatory Visit (HOSPITAL_COMMUNITY)
Admission: RE | Admit: 2023-09-16 | Discharge: 2023-09-16 | Disposition: A | Discharge status: Home / Self Care | Source: Ambulatory Visit | Attending: Internal Medicine | Admitting: Internal Medicine

## 2023-09-16 VITALS — BP 142/90 | HR 66 | Ht 62.5 in | Wt 127.8 lb

## 2023-09-16 DIAGNOSIS — Z85850 Personal history of malignant neoplasm of thyroid: Secondary | ICD-10-CM | POA: Insufficient documentation

## 2023-09-16 DIAGNOSIS — R197 Diarrhea, unspecified: Secondary | ICD-10-CM

## 2023-09-16 DIAGNOSIS — R152 Fecal urgency: Secondary | ICD-10-CM

## 2023-09-16 DIAGNOSIS — R159 Full incontinence of feces: Secondary | ICD-10-CM

## 2023-09-16 DIAGNOSIS — Z800 Family history of malignant neoplasm of digestive organs: Secondary | ICD-10-CM

## 2023-09-16 NOTE — Progress Notes (Signed)
 Chief Complaint: Diarrhea with fecal incontinence  HPI:    Linda Wang is a 73 year old female, known to Dr. Rhea Belton, with a past medical history as listed below, who was referred to me by Laqueta Due., MD for a complaint of with fecal incontinence.      02/01/2019 colonoscopy done for personal history of nonadvanced adenoma and a family history of colon cancer in a first-degree relative before the age of 17 with one 3 mm polyp in the ascending colon, one 2 mm polyp in the transverse colon, patent end-to-end Colo colonic anastomosis, diverticulosis in the descending and transverse colon internal hemorrhoids.  Pathology showed benign polypoid rectal mucosa.  Repeat recommended in 5 years given family history.    07/17/2019 office visit with Doug Sou for rectal pain.  At that time anal fissure.  Treated with Nitroglycerin ointment 0.125% twice daily-3 times daily for 6 to 8 weeks.    07/29/2023 BMP with a sodium low at 133 and otherwise normal.    Today, the patient tells me she has had a very stressful 2-1/2 to 3 years since her significant other died.  She has lost 50 pounds without really trying and around Christmas time started with diarrhea having 1-2 urgent watery stools a day which were uncontrollable.  She would bring extra clothes with her around just in case she had an accident which did happen.  Afraid to pass gas due to fear of passing stool.  This lasted for about a month and occurred every day.  She then changed her diet and started eating some yogurt with granola and wheat crackers and really eating healthier and since then over the past 2 months she has had no further problems.    Patient aware of repeat colonoscopy recommended in July.  She requests a Monday the first week of August if possible.  Also asked for a work note today.    Denies fever, chills or weight loss.  Past Medical History:  Diagnosis Date   Anxiety    Aortic atherosclerosis (HCC)    Asthma    recent oral  Prednisone use last dose 04-13-16   Cataract    Bilateral   DDD (degenerative disc disease), lumbar    Depression    Diverticulitis    Diverticulosis of colon (without mention of hemorrhage)    Dizziness    EVALUATED WITH MANY TESTS INCLUDING CT SCAN HEAD--NO PROBLEMS FOUND--PT STILL HAS OCCAS BOUT OF DIZZINESS.   Gallstones    GERD (gastroesophageal reflux disease)    noted on an upper GI endoscopy but does not require medication   Hemorrhoids    History of bronchitis    Hyperlipidemia    Hypertension    Hypothyroidism    Internal hemorrhoids    Myositis    OA (osteoarthritis)    generalized   Obesity    Seasonal allergies    Segmental colitis (HCC)    Thyroid cancer (HCC)    Thyroid   TMJ (temporomandibular joint syndrome)    BILATERAL- uses nightly mouthpiece.   Tubular adenoma of colon 01/20/2000   Umbilical hernia     Past Surgical History:  Procedure Laterality Date   ABDOMINAL HYSTERECTOMY     APPENDECTOMY     BREAST BIOPSY Right 2021   CESAREAN SECTION     CHOLECYSTECTOMY N/A 01/13/2018   Procedure: LAPAROSCOPIC CHOLECYSTECTOMY WITH INTRAOPERATIVE CHOLANGIOGRAM;  Surgeon: Darnell Level, MD;  Location: [facility];  Service: General;  Laterality: N/A;   COLONOSCOPY  10/04/2012  HAND NEUROPLASTY     decompression median nerve at carpal tunnel   HEMORRHOID BANDING     HEMORRHOID SURGERY     in office on 05/26/2015    HEMORROIDECTOMY     INSERTION OF MESH N/A 04/29/2016   Procedure: INSERTION OF MESH;  Surgeon: Darnell Level, MD;  Location: [facility];  Service: General;  Laterality: N/A;   PARTIAL COLECTOMY N/A 10/12/2012   Procedure: SIGMOID COLECTOMY;  Surgeon: Velora Heckler, MD;  Location: [facility];  Service: General;  Laterality: N/A;   RECTOCELE REPAIR     THYROIDECTOMY, PARTIAL     Positive cancer-no further issues   TUBAL LIGATION     UPPER GI ENDOSCOPY     VENTRAL HERNIA REPAIR N/A 04/29/2016   Procedure: LAPAROSCOPIC VENTRAL INCISIONAL  HERNIA;  Surgeon: Darnell Level, MD;  Location: [facility];  Service: General;  Laterality: N/A;    Current Outpatient Medications  Medication Sig Dispense Refill   albuterol (PROVENTIL HFA) 108 (90 BASE) MCG/ACT inhaler Inhale 2 puffs into the lungs 2 (two) times daily. Not using     ALPRAZolam (XANAX) 0.5 MG tablet Take 0.25-0.5 mg by mouth 3 (three) times daily as needed for anxiety.      AMBULATORY NON FORMULARY MEDICATION nitroglycerin 0.125% gel. Apply a pea size amount to your rectum BID times daily x 6-8 weeks. 30 g 0   Ascorbic Acid (VITAMIN C PO) Take 500 mg by mouth daily.      carvedilol (COREG) 6.25 MG tablet Take 6.25 mg by mouth 2 (two) times daily with a meal.     Cholecalciferol (VITAMIN D3) 50 MCG (2000 UT) capsule Take 2,000 Units by mouth daily.     cycloSPORINE (RESTASIS) 0.05 % ophthalmic emulsion 1 drop 2 (two) times daily.     estradiol (ESTRACE) 1 MG tablet Take 1 mg by mouth daily.      ezetimibe (ZETIA) 10 MG tablet Take by mouth.     Fiber POWD Take 1 Scoop by mouth daily.     levothyroxine (SYNTHROID, LEVOTHROID) 88 MCG tablet Take 88 mcg by mouth daily before breakfast.     Polyethyl Glycol-Propyl Glycol (SYSTANE OP) Place 1 drop into the left eye 3 (three) times daily.     Probiotic CAPS Take 1 capsule by mouth daily.     sodium chloride (OCEAN) 0.65 % SOLN nasal spray Place 1 spray into both nostrils as needed for congestion.     vitamin E 400 UNIT capsule Take 800 Units by mouth daily.     No current facility-administered medications for this visit.    Allergies as of 09/16/2023 - Review Complete 07/05/2023  Allergen Reaction Noted   Morphine and codeine Other (See Comments) 05/26/2015   Other  06/18/2016   Statins  09/20/2011   Sulfonamide derivatives Hives    Tramadol Other (See Comments) 01/14/2018    Family History  Problem Relation Age of Onset   Thyroid cancer Mother    Diabetes Mother    Heart disease Mother    Colon cancer Father    Bladder Cancer Father     Prostate cancer Brother    Prostate cancer Brother    Colonic polyp Brother    Colon polyps Brother     Social History   Socioeconomic History   Marital status: Single    Spouse name: Not on file   Number of children: 1   Years of education: Not on file   Highest education level: Not on file  Occupational History   Occupation: school cashier-retired    Employer: [employer]  Tobacco Use   Smoking status: Never   Smokeless tobacco: Never  Vaping Use   Vaping status: Never Used  Substance and Sexual Activity   Alcohol use: No   Drug use: No   Sexual activity: Not Currently    Birth control/protection: Surgical  Other Topics Concern   Not on file  Social History Narrative   Not on file   Social Drivers of Health   Financial Resource Strain: Medium Risk (09/15/2021)   Received from [facility] prior to 09/04/2022., [facility] visits prior to 09/04/2022., [facility]   Overall Financial Resource Strain (CARDIA)    Difficulty of Paying Living Expenses: Somewhat hard  Food Insecurity: Medium Risk (05/11/2023)   Received from [facility]   Hunger Vital Sign    Worried About Running Out of Food in the Last Year: Sometimes true    Ran Out of Food in the Last Year: Never true  Transportation Needs: Unmet Transportation Needs (05/11/2023)   Received from [employer]    In the past 12 months, has lack of reliable transportation kept you from medical appointments, meetings, work or from getting things needed for daily living? : Yes  Physical Activity: Insufficiently Active (09/15/2021)   [facility] visits prior to 09/04/2022., [facility] visits prior to 09/04/2022., [facility]   Exercise Vital Sign    Days of Exercise per Week: 3 days    Minutes of Exercise per Session: 40 min  Stress: Not on file  Social Connections: Unknown (11/16/2021)    Received from [facility], [facility]   Social Network    Social Network: Not on file  Intimate Partner Violence: Unknown (10/08/2021)   Received from [employer], [facility]   HITS    Physically Hurt: Not on file    Insult or Talk Down To: Not on file    Threaten Physical Harm: Not on file    Scream or Curse: Not on file    Review of Systems:    Constitutional: No weight loss, fever or chills Skin: No rash  Cardiovascular: No chest pain Respiratory: No SOB Gastrointestinal: See HPI and otherwise negative Genitourinary: No dysuria Neurological: No headache, dizziness or syncope Musculoskeletal: No new muscle or joint pain Hematologic: No bleeding  Psychiatric: No history of depression or anxiety   Physical Exam:  Vital signs: BP (!) 142/90 (BP Location: Right Arm, Patient Position: Sitting, Cuff Size: Normal)   Pulse 66   Ht 5' 2.5" (1.588 m)   Wt 127 lb 12.8 oz (58 kg)   BMI 23.00 kg/m    Constitutional:   Pleasant Elderly Caucasian female appears to be in NAD, Well developed, Well nourished, alert and cooperative Head:  Normocephalic and atraumatic. Eyes:   PEERL, EOMI. No icterus. Conjunctiva pink. Ears:  Normal auditory acuity. Neck:  Supple Throat: Oral cavity and pharynx without inflammation, swelling or lesion.  Respiratory: Respirations even and unlabored. Lungs clear to auscultation bilaterally.   No wheezes, crackles, or rhonchi.  Cardiovascular: Normal S1, S2. No MRG. Regular rate and rhythm. No peripheral edema, cyanosis or pallor.  Gastrointestinal:  Soft, nondistended, nontender. No rebound or guarding. Normal bowel sounds. No appreciable masses or hepatomegaly. Rectal:  Not performed.  Msk:  Symmetrical without gross deformities. Without edema, no deformity or joint abnormality.  Neurologic:  Alert and  oriented x4;  grossly normal neurologically.  Skin:   Dry and intact without significant lesions or rashes. Psychiatric: Demonstrates good  judgement and reason without abnormal affect or behaviors.  RELEVANT LABS AND IMAGING: CBC    Component Value Date/Time   WBC 6.9 07/05/2023 2349   RBC 4.19 07/05/2023 2349   HGB 12.9 07/05/2023 2349   HCT 39.1 07/05/2023 2349   PLT 252 07/05/2023 2349   MCV 93.3 07/05/2023 2349   MCH 30.8 07/05/2023 2349   MCHC 33.0 07/05/2023 2349   RDW 12.4 07/05/2023 2349   LYMPHSABS 1.1 12/05/2017 0336   MONOABS 0.3 12/05/2017 0336   EOSABS 0.1 12/05/2017 0336   BASOSABS 0.0 12/05/2017 0336    CMP     Component Value Date/Time   NA 133 (L) 07/05/2023 2349   K 3.4 (L) 07/05/2023 2349   CL 103 07/05/2023 2349   CO2 22 07/05/2023 2349   GLUCOSE 114 (H) 07/05/2023 2349   BUN 14 07/05/2023 2349   CREATININE 0.69 07/05/2023 2349   CALCIUM 8.8 (L) 07/05/2023 2349   PROT 7.6 07/05/2023 2349   ALBUMIN 3.7 07/05/2023 2349   AST 17 07/05/2023 2349   ALT 14 07/05/2023 2349   ALKPHOS 33 (L) 07/05/2023 2349   BILITOT 0.4 07/05/2023 2349   GFRNONAA >60 07/05/2023 2349   GFRAA >60 01/11/2018 0934    Assessment: 1.  Diarrhea with fecal incontinence: This lasted for about a month or 2, she has had no further episodes over the past 2 months with a change in diet to more fiber; consider viral versus IBS versus other 2.  Family history of colon cancer: Last colonoscopy in July 2020, repeat recommended in July 2025  Plan: 1.  Patient will be put in recall for a colonoscopy on a Monday the first week in August with Dr. Rhea Belton for family history of colon cancer. 2.  Patient encouraged to continue high-fiber diet.  She will call if symptoms return. 3.  Patient to follow in clinic with [place] as needed or for her colonoscopy in August. 4.  Provided her with a work note today.  Hyacinth Meeker, PA-C [facility] 09/16/2023, 10:42 AM  Cc: Laqueta Due., MD

## 2023-10-03 NOTE — Progress Notes (Signed)
 Addendum: Reviewed and agree with assessment and management plan. Asha Grumbine, Carie Caddy, MD

## 2023-11-24 ENCOUNTER — Encounter: Payer: Self-pay | Admitting: Internal Medicine

## 2024-02-03 HISTORY — PX: BLEPHAROPLASTY: SUR158

## 2024-02-06 ENCOUNTER — Ambulatory Visit (AMBULATORY_SURGERY_CENTER)

## 2024-02-06 VITALS — Ht 62.5 in | Wt 128.0 lb

## 2024-02-06 DIAGNOSIS — R197 Diarrhea, unspecified: Secondary | ICD-10-CM

## 2024-02-06 DIAGNOSIS — Z860100 Personal history of colon polyps, unspecified: Secondary | ICD-10-CM

## 2024-02-06 DIAGNOSIS — R152 Fecal urgency: Secondary | ICD-10-CM

## 2024-02-06 DIAGNOSIS — Z800 Family history of malignant neoplasm of digestive organs: Secondary | ICD-10-CM

## 2024-02-06 DIAGNOSIS — R159 Full incontinence of feces: Secondary | ICD-10-CM

## 2024-02-06 MED ORDER — NA SULFATE-K SULFATE-MG SULF 17.5-3.13-1.6 GM/177ML PO SOLN: 1.0000 | Freq: Once | ORAL | 0 refills | Status: AC

## 2024-02-06 NOTE — Progress Notes (Signed)

## 2024-02-08 ENCOUNTER — Ambulatory Visit
Admission: RE | Admit: 2024-02-08 | Discharge: 2024-02-08 | Disposition: A | Discharge status: Home / Self Care | Payer: Medicare Other | Source: Ambulatory Visit | Attending: Internal Medicine | Admitting: Internal Medicine

## 2024-02-08 DIAGNOSIS — Z1231 Encounter for screening mammogram for malignant neoplasm of breast: Secondary | ICD-10-CM

## 2024-02-17 ENCOUNTER — Telehealth: Payer: Self-pay | Admitting: Internal Medicine

## 2024-02-17 NOTE — Telephone Encounter (Signed)
 Good morning Dr. Albertus, this patient called and stated that she was recommended by her PCP to reschedule her procedure for August the 18 th due to her having a sinus infection and her PCP placing her on Antibiotics. Patient was rescheduled for October the 27 th. Pre-visit was also added for October the 13 th. Please advise.

## 2024-02-17 NOTE — Telephone Encounter (Signed)
 Called patient back but had to leave a voicemail. I explained that if she was just having a runny nose and sneezing without a fever that would be ok to proceed on Monday. However, if she was dizzy today that could potentially get worse as she starts her prep and could be a concern. I asked her to call us  back after returning from the urgent care and let us  know how she feels.

## 2024-02-17 NOTE — Telephone Encounter (Signed)
 Inbound call from patient stated she's been sneezing has running nose and has been feeling dizzy, will be heading to the urgent care due to her feeling this way. Patient stated she has an upcoming procedure on 02/20/2024 and wanted to know if her feeling this way would interfere with her procedure..  Requesting a call back   Please advise Thank you

## 2024-02-17 NOTE — Telephone Encounter (Signed)
 Noted. Thanks.

## 2024-02-20 ENCOUNTER — Encounter: Admitting: Internal Medicine

## 2024-04-16 ENCOUNTER — Ambulatory Visit (AMBULATORY_SURGERY_CENTER)

## 2024-04-16 VITALS — Ht 62.5 in | Wt 132.2 lb

## 2024-04-16 DIAGNOSIS — R159 Full incontinence of feces: Secondary | ICD-10-CM

## 2024-04-16 DIAGNOSIS — R197 Diarrhea, unspecified: Secondary | ICD-10-CM

## 2024-04-16 DIAGNOSIS — Z860100 Personal history of colon polyps, unspecified: Secondary | ICD-10-CM

## 2024-04-16 DIAGNOSIS — R152 Fecal urgency: Secondary | ICD-10-CM

## 2024-04-16 DIAGNOSIS — Z800 Family history of malignant neoplasm of digestive organs: Secondary | ICD-10-CM

## 2024-04-16 NOTE — Progress Notes (Signed)

## 2024-04-30 ENCOUNTER — Ambulatory Visit: Admitting: Internal Medicine

## 2024-04-30 ENCOUNTER — Encounter: Payer: Self-pay | Admitting: Internal Medicine

## 2024-04-30 VITALS — BP 101/68 | HR 68 | Temp 97.3°F | Resp 15 | Ht 62.5 in | Wt 132.2 lb

## 2024-04-30 DIAGNOSIS — Z980 Intestinal bypass and anastomosis status: Secondary | ICD-10-CM | POA: Diagnosis not present

## 2024-04-30 DIAGNOSIS — Z800 Family history of malignant neoplasm of digestive organs: Secondary | ICD-10-CM | POA: Diagnosis not present

## 2024-04-30 DIAGNOSIS — Z1211 Encounter for screening for malignant neoplasm of colon: Secondary | ICD-10-CM | POA: Diagnosis present

## 2024-04-30 DIAGNOSIS — Z860100 Personal history of colon polyps, unspecified: Secondary | ICD-10-CM | POA: Diagnosis not present

## 2024-04-30 DIAGNOSIS — K5730 Diverticulosis of large intestine without perforation or abscess without bleeding: Secondary | ICD-10-CM | POA: Diagnosis not present

## 2024-04-30 MED ORDER — SODIUM CHLORIDE 0.9 % IV SOLN: 500.0000 mL | Freq: Once | INTRAVENOUS | Status: DC

## 2024-04-30 NOTE — Progress Notes (Signed)
1135 Ephedrine 10 mg given IV due to low BP, MD updated.

## 2024-04-30 NOTE — Op Note (Signed)
 [facility] Patient Name: Linda Wang Procedure Date: 04/30/2024 11:24 AM MRN: [id number] Endoscopist: Gordy [name] , MD, [id number] Age: 73 Referring MD:  Date of Birth: 1950-11-04 Gender: Female Account #: [id number] Procedure:                Colonoscopy Indications:              Screening in patient at increased risk: Family                            history of 1st-degree relative with colorectal                            cancer, Last colonoscopy: July 2020 Medicines:                Monitored Anesthesia Care Procedure:                Pre-Anesthesia Assessment:                           - Prior to the procedure, a History and Physical                            was performed, and patient medications and                            allergies were reviewed. The patient's tolerance of                            previous anesthesia was also reviewed. The risks                            and benefits of the procedure and the sedation                            options and risks were discussed with the patient.                            All questions were answered, and informed consent                            was obtained. Prior Anticoagulants: The patient has                            taken no anticoagulant or antiplatelet agents. ASA                            Grade Assessment: II - A patient with mild systemic                            disease. After reviewing the risks and benefits,                            the patient was deemed in satisfactory condition to  undergo the procedure.                           After obtaining informed consent, the colonoscope                            was passed under direct vision. Throughout the                            procedure, the patient's blood pressure, pulse, and                            oxygen  saturations were monitored continuously. The                            Olympus CF-HQ190L ([id number])  Colonoscope was                            introduced through the anus and advanced to the                            cecum, identified by appendiceal orifice and                            ileocecal valve. The colonoscopy was performed                            without difficulty. The patient tolerated the                            procedure well. The quality of the bowel                            preparation was good. The ileocecal valve,                            appendiceal orifice, and rectum were photographed. Scope In: 11:29:20 AM Scope Out: 11:40:55 AM Scope Withdrawal Time: 0 hours 8 minutes 6 seconds  Total Procedure Duration: 0 hours 11 minutes 35 seconds  Findings:                 The digital rectal exam was normal.                           Multiple large-mouthed, medium-mouthed and                            small-mouthed diverticula were found in the sigmoid                            colon, descending colon, transverse colon and                            ascending colon.  There was evidence of a prior end-to-end                            colo-colonic anastomosis in the mid sigmoid colon.                            This was patent and was characterized by healthy                            appearing mucosa.                           The retroflexed view of the distal rectum and anal                            verge was normal and showed no anal or rectal                            abnormalities. Complications:            No immediate complications. Estimated Blood Loss:     Estimated blood loss: none. Impression:               - Moderate diverticulosis in the sigmoid colon, in                            the descending colon, in the transverse colon and                            in the ascending colon.                           - Patent end-to-end colo-colonic anastomosis,                            characterized by healthy appearing  mucosa.                           - The distal rectum and anal verge are normal on                            retroflexion view.                           - No specimens collected. Recommendation:           - Patient has a contact number available for                            emergencies. The signs and symptoms of potential                            delayed complications were discussed with the                            patient. Return to normal activities tomorrow.  Written discharge instructions were provided to the                            patient.                           - Resume previous diet.                           - Continue present medications.                           - Repeat colonoscopy in 5 years for screening                            purposes (office visit 1st to discuss risks and                            benefits of repeat colonoscopy). Gordy [name], MD 04/30/2024 11:48:32 AM This report has been signed electronically.

## 2024-04-30 NOTE — Progress Notes (Signed)
 Report given to PACU, vss

## 2024-04-30 NOTE — Progress Notes (Signed)
 GASTROENTEROLOGY PROCEDURE H&P NOTE   Primary Care Physician: Deane Camie HERO., MD    Reason for Procedure:  Family history of colon cancer  Plan:    Colonoscopy  Patient is appropriate for endoscopic procedure(s) in the ambulatory ([facility]) setting.  The nature of the procedure, as well as the risks, benefits, and alternatives were carefully and thoroughly reviewed with the patient. Ample time for discussion and questions allowed. The patient understood, was satisfied, and agreed to proceed.     HPI: Linda Wang is a 73 y.o. female who presents for colonoscopy.  Medical history as below.  Tolerated the prep.  No recent chest pain or shortness of breath.  No abdominal pain today.  Past Medical History:  Diagnosis Date   Anxiety    Aortic atherosclerosis    Asthma    recent oral Prednisone  use last dose 04-13-16   Cataract    Bilateral   DDD (degenerative disc disease), lumbar    Depression    Diverticulitis    Diverticulosis of colon (without mention of hemorrhage)    Dizziness    EVALUATED WITH MANY TESTS INCLUDING CT SCAN HEAD--NO PROBLEMS FOUND--PT STILL HAS OCCAS BOUT OF DIZZINESS.   Gallstones    GERD (gastroesophageal reflux disease)    noted on an upper GI endoscopy but does not require medication   Hemorrhoids    History of bronchitis    Hyperlipidemia    Hypertension    Hypothyroidism    Internal hemorrhoids    Myositis    OA (osteoarthritis)    generalized   Obesity    Seasonal allergies    Segmental colitis (HCC)    Thyroid  cancer (HCC)    Thyroid    TMJ (temporomandibular joint syndrome)    BILATERAL- uses nightly mouthpiece.   Tubular adenoma of colon 01/20/2000   Umbilical hernia     Past Surgical History:  Procedure Laterality Date   ABDOMINAL HYSTERECTOMY     APPENDECTOMY     BLEPHAROPLASTY Left 02/2024   BREAST BIOPSY Right 2021   CESAREAN SECTION     CHOLECYSTECTOMY N/A 01/13/2018   Procedure: LAPAROSCOPIC CHOLECYSTECTOMY WITH  INTRAOPERATIVE CHOLANGIOGRAM;  Surgeon: Eletha Boas, MD;  Location: [facility];  Service: General;  Laterality: N/A;   COLONOSCOPY  10/04/2012   HAND NEUROPLASTY     decompression median nerve at carpal tunnel   HEMORRHOID BANDING     HEMORRHOID SURGERY     in office on 05/26/2015    HEMORROIDECTOMY     INSERTION OF MESH N/A 04/29/2016   Procedure: INSERTION OF MESH;  Surgeon: Boas Eletha, MD;  Location: [facility];  Service: General;  Laterality: N/A;   PARTIAL COLECTOMY N/A 10/12/2012   Procedure: SIGMOID COLECTOMY;  Surgeon: Boas HERO Eletha, MD;  Location: [facility];  Service: General;  Laterality: N/A;   RECTOCELE REPAIR     THYROIDECTOMY, PARTIAL     Positive cancer-no further issues   TUBAL LIGATION     UPPER GI ENDOSCOPY     VENTRAL HERNIA REPAIR N/A 04/29/2016   Procedure: LAPAROSCOPIC VENTRAL INCISIONAL  HERNIA;  Surgeon: Boas Eletha, MD;  Location: [facility];  Service: General;  Laterality: N/A;    Prior to Admission medications   Medication Sig Start Date End Date Taking? Authorizing Provider  ALPRAZolam  (XANAX ) 1 MG tablet Take 1 mg by mouth 3 (three) times daily as needed. for anxiety 04/06/24  Yes [provider]  carvedilol  (COREG ) 6.25 MG tablet Take 6.25 mg by mouth 2 (two)  times daily with a meal.   Yes [provider]  cycloSPORINE  (RESTASIS ) 0.05 % ophthalmic emulsion 1 drop 2 (two) times daily.   Yes [provider]  estradiol  (ESTRACE ) 1 MG tablet Take 1 mg by mouth daily.    Yes [provider]  ezetimibe (ZETIA) 10 MG tablet Take by mouth. 01/30/19 04/30/24 Yes [provider]  levothyroxine  (SYNTHROID , LEVOTHROID) 88 MCG tablet Take 88 mcg by mouth daily before breakfast.   Yes [provider]  valACYclovir (VALTREX) 500 MG tablet Take 500 mg by mouth daily.   Yes [provider]  albuterol  (PROVENTIL  HFA) 108 (90 BASE) MCG/ACT inhaler Inhale 2 puffs into the lungs 2 (two) times daily. Not using Patient not taking:  Reported on 09/16/2023    [provider]  Ascorbic Acid (VITAMIN C GUMMIE PO) Take 500 mg by mouth daily.    [provider]  Calcium Carbonate (CALCIUM 600 PO) Take 1 tablet by mouth daily.    [provider]  Cholecalciferol (VITAMIN D-3) 125 MCG (5000 UT) TABS Take 2 tablets by mouth daily.    [provider]  Omega-3 1000 MG CAPS Take 1 capsule by mouth daily. 03/17/20   [provider]  sodium chloride  (OCEAN) 0.65 % SOLN nasal spray Place 1 spray into both nostrils as needed for congestion.    [provider]  vitamin E  400 UNIT capsule Take 800 Units by mouth daily. Patient not taking: No sig reported    [provider]    Current Outpatient Medications  Medication Sig Dispense Refill   ALPRAZolam  (XANAX ) 1 MG tablet Take 1 mg by mouth 3 (three) times daily as needed. for anxiety     carvedilol  (COREG ) 6.25 MG tablet Take 6.25 mg by mouth 2 (two) times daily with a meal.     cycloSPORINE  (RESTASIS ) 0.05 % ophthalmic emulsion 1 drop 2 (two) times daily.     estradiol  (ESTRACE ) 1 MG tablet Take 1 mg by mouth daily.      ezetimibe (ZETIA) 10 MG tablet Take by mouth.     levothyroxine  (SYNTHROID , LEVOTHROID) 88 MCG tablet Take 88 mcg by mouth daily before breakfast.     valACYclovir (VALTREX) 500 MG tablet Take 500 mg by mouth daily.     albuterol  (PROVENTIL  HFA) 108 (90 BASE) MCG/ACT inhaler Inhale 2 puffs into the lungs 2 (two) times daily. Not using (Patient not taking: Reported on 09/16/2023)     Ascorbic Acid (VITAMIN C GUMMIE PO) Take 500 mg by mouth daily.     Calcium Carbonate (CALCIUM 600 PO) Take 1 tablet by mouth daily.     Cholecalciferol (VITAMIN D-3) 125 MCG (5000 UT) TABS Take 2 tablets by mouth daily.     Omega-3 1000 MG CAPS Take 1 capsule by mouth daily.     sodium chloride  (OCEAN) 0.65 % SOLN nasal spray Place 1 spray into both nostrils as needed for congestion.     vitamin E  400 UNIT capsule Take 800 Units by  mouth daily. (Patient not taking: No sig reported)     Current Facility-Administered Medications  Medication Dose Route Frequency Provider Last Rate Last Admin   0.9 %  sodium chloride  infusion  500 mL Intravenous Once Aster Eckrich, Gordy HERO, MD        Allergies as of 04/30/2024 - Review Complete 04/30/2024  Allergen Reaction Noted   Other Other (See Comments) 08/01/2014   Sulfonamide derivatives Hives    Morphine  and codeine Other (See  Comments) 05/26/2015   Tramadol  Other (See Comments) 01/14/2018   Hydrochlorothiazide-triamterene Other (See Comments) 05/09/2012   Hydrocodone -acetaminophen  Other (See Comments) 05/09/2012   Nitroglycerin Other (See Comments) 03/26/2020   Rosuvastatin calcium Other (See Comments) 09/20/2011   Statins Other (See Comments) 09/20/2011    Family History  Problem Relation Age of Onset   Thyroid  cancer Mother    Diabetes Mother    Heart disease Mother    Colon cancer Father    Bladder Cancer Father    Prostate cancer Brother    Prostate cancer Brother    Colonic polyp Brother    Colon polyps Brother    Stomach cancer Neg Hx    Rectal cancer Neg Hx     Social History   Socioeconomic History   Marital status: Single    Spouse name: Not on file   Number of children: 1   Years of education: Not on file   Highest education level: Not on file  Occupational History   Occupation: school cashier-retired    Employer: [employer]  Tobacco Use   Smoking status: Never   Smokeless tobacco: Never  Vaping Use   Vaping status: Never Used  Substance and Sexual Activity   Alcohol use: Yes    Comment: occasionally   Drug use: No   Sexual activity: Not Currently    Birth control/protection: Surgical  Other Topics Concern   Not on file  Social History Narrative   Not on file   Social Drivers of Health   Financial Resource Strain: Medium Risk (09/15/2021)   Received from [facility] visits prior to 09/04/2022., [facility]    Overall Financial Resource Strain (CARDIA)    Difficulty of Paying Living Expenses: Somewhat hard  Food Insecurity: Medium Risk (05/11/2023)   Received from [facility]   Hunger Vital Sign    Within the past 12 months, you worried that your food would run out before you got money to buy more: Sometimes true    Within the past 12 months, the food you bought just didn't last and you didn't have money to get more. : Never true  Transportation Needs: Unmet Transportation Needs (05/11/2023)   Received from [facility]    In the past 12 months, has lack of reliable transportation kept you from medical appointments, meetings, work or from getting things needed for daily living? : Yes  Physical Activity: Insufficiently Active (09/15/2021)   Received from [facility] visits prior to 09/04/2022., [facility]   Exercise Vital Sign    On average, how many days per week do you engage in moderate to strenuous exercise (like a brisk walk)?: 3 days    On average, how many minutes do you engage in exercise at this level?: 40 min  Stress: Not on file  Social Connections: Unknown (11/16/2021)   Received [facility]   Social Network    Social Network: Not on file  Intimate Partner Violence: Unknown (10/08/2021)   Received from [facility]   HITS    Physically Hurt: Not on file    Insult or Talk Down To: Not on file    Threaten Physical Harm: Not on file    Scream or Curse: Not on file    Physical Exam: Vital signs in last 24 hours: @BP  139/75   Pulse 69   Temp (!) 97.3 F (36.3 C)   Ht 5' 2.5 (1.588 m)   Wt 132 lb  3.2 oz (60 kg)   SpO2 94%   BMI 23.79 kg/m  GEN: NAD EYE: Sclerae anicteric ENT: MMM CV: Non-tachycardic Pulm: CTA b/l GI: Soft, NT/ND NEURO:  Alert & Oriented x 3   Gordy Starch, MD [facility]  04/30/2024 11:18 AM

## 2024-04-30 NOTE — Patient Instructions (Addendum)
 Continue present medications. Repeat colonoscopy in 5 years for screening purposes (office visit 1st to discuss risks and benefits of repeat colonoscopy).  Please read over handout YOU HAD AN ENDOSCOPIC PROCEDURE TODAY AT THE [facility]:   Refer to the procedure report that was given to you for any specific questions about what was found during the examination.  If the procedure report does not answer your questions, please call your gastroenterologist to clarify.  If you requested that your care partner not be given the details of your procedure findings, then the procedure report has been included in a sealed envelope for you to review at your convenience later.  YOU SHOULD EXPECT: Some feelings of bloating in the abdomen. Passage of more gas than usual.  Walking can help get rid of the air that was put into your GI tract during the procedure and reduce the bloating. If you had a lower endoscopy (such as a colonoscopy or flexible sigmoidoscopy) you may notice spotting of blood in your stool or on the toilet paper. If you underwent a bowel prep for your procedure, you may not have a normal bowel movement for a few days.  Please Note:  You might notice some irritation and congestion in your nose or some drainage.  This is from the oxygen  used during your procedure.  There is no need for concern and it should clear up in a day or so.  SYMPTOMS TO REPORT IMMEDIATELY:  Following lower endoscopy (colonoscopy or flexible sigmoidoscopy):  Excessive amounts of blood in the stool  Significant tenderness or worsening of abdominal pains  Swelling of the abdomen that is new, acute  Fever of 100F or higher  For urgent or emergent issues, a gastroenterologist can be reached at any hour by calling [phone]. Do not use MyChart messaging for urgent concerns.    DIET:  We do recommend a small meal at first, but then you may proceed to your regular diet.  Drink plenty of fluids but you  should avoid alcoholic beverages for 24 hours.  ACTIVITY:  You should plan to take it easy for the rest of today and you should NOT DRIVE or use heavy machinery until tomorrow (because of the sedation medicines used during the test).    FOLLOW UP: Our staff will call the number listed on your records the next business day following your procedure.  We will call around 7:15- 8:00 am to check on you and address any questions or concerns that you may have regarding the information given to you following your procedure. If we do not reach you, we will leave a message.     If any biopsies were taken you will be contacted by phone or by letter within the next 1-3 weeks.  Please call us  at [phone] if you have not heard about the biopsies in 3 weeks.    SIGNATURES/CONFIDENTIALITY: You and/or your care partner have signed paperwork which will be entered into your electronic medical record.  These signatures attest to the fact that that the information above on your After Visit Summary has been reviewed and is understood.  Full responsibility of the confidentiality of this discharge information lies with you and/or your care-partner.

## 2024-04-30 NOTE — Progress Notes (Signed)
 Pt's states no medical or surgical changes since previsit or office visit.

## 2024-05-01 ENCOUNTER — Telehealth: Payer: Self-pay | Admitting: *Deleted

## 2024-05-01 NOTE — Telephone Encounter (Signed)
  Follow up Call-     04/30/2024   10:20 AM  Call back number  Post procedure Call Back phone  # [phone]  Permission to leave phone message Yes     Patient questions:  Do you have a fever, pain , or abdominal swelling? No. Pain Score  0 *  Have you tolerated food without any problems? Yes.    Have you been able to return to your normal activities? Yes.    Do you have any questions about your discharge instructions: Diet   No. Medications  No. Follow up visit  No.  Do you have questions or concerns about your Care? No.  Actions: * If pain score is 4 or above: No action needed, pain <4.

## 2024-06-26 ENCOUNTER — Other Ambulatory Visit: Payer: Self-pay

## 2024-06-26 ENCOUNTER — Emergency Department (HOSPITAL_COMMUNITY)
Admission: EM | Admit: 2024-06-26 | Discharge: 2024-06-26 | Disposition: A | Discharge status: Home / Self Care | Attending: Emergency Medicine | Admitting: Emergency Medicine

## 2024-06-26 ENCOUNTER — Encounter (HOSPITAL_COMMUNITY): Payer: Self-pay | Admitting: Emergency Medicine

## 2024-06-26 DIAGNOSIS — R531 Weakness: Secondary | ICD-10-CM | POA: Insufficient documentation

## 2024-06-26 DIAGNOSIS — Z79899 Other long term (current) drug therapy: Secondary | ICD-10-CM | POA: Insufficient documentation

## 2024-06-26 DIAGNOSIS — R110 Nausea: Secondary | ICD-10-CM | POA: Diagnosis present

## 2024-06-26 DIAGNOSIS — F419 Anxiety disorder, unspecified: Secondary | ICD-10-CM | POA: Diagnosis not present

## 2024-06-26 LAB — CBC
HCT: 38.6 % (ref 36.0–46.0)
Hemoglobin: 12.8 g/dL (ref 12.0–15.0)
MCH: 30.7 pg (ref 26.0–34.0)
MCHC: 33.2 g/dL (ref 30.0–36.0)
MCV: 92.6 fL (ref 80.0–100.0)
Platelets: 298 K/uL (ref 150–400)
RBC: 4.17 MIL/uL (ref 3.87–5.11)
RDW: 12.3 % (ref 11.5–15.5)
WBC: 5.8 K/uL (ref 4.0–10.5)
nRBC: 0 % (ref 0.0–0.2)

## 2024-06-26 LAB — COMPREHENSIVE METABOLIC PANEL WITH GFR
ALT: 10 U/L (ref 0–44)
AST: 18 U/L (ref 15–41)
Albumin: 3.9 g/dL (ref 3.5–5.0)
Alkaline Phosphatase: 41 U/L (ref 38–126)
Anion gap: 8 (ref 5–15)
BUN: 14 mg/dL (ref 8–23)
CO2: 27 mmol/L (ref 22–32)
Calcium: 9.3 mg/dL (ref 8.9–10.3)
Chloride: 103 mmol/L (ref 98–111)
Creatinine, Ser: 0.63 mg/dL (ref 0.44–1.00)
GFR, Estimated: >60 mL/min
Glucose, Bld: 95 mg/dL (ref 70–99)
Potassium: 3.8 mmol/L (ref 3.5–5.1)
Sodium: 138 mmol/L (ref 135–145)
Total Bilirubin: 0.3 mg/dL (ref 0.0–1.2)
Total Protein: 8.3 g/dL — ABNORMAL HIGH (ref 6.5–8.1)

## 2024-06-26 LAB — URINALYSIS, ROUTINE W REFLEX MICROSCOPIC
Bilirubin Urine: NEGATIVE
Glucose, UA: NEGATIVE mg/dL
Hgb urine dipstick: NEGATIVE
Ketones, ur: NEGATIVE mg/dL
Leukocytes,Ua: NEGATIVE
Nitrite: NEGATIVE
Protein, ur: NEGATIVE mg/dL
Specific Gravity, Urine: 1.021 (ref 1.005–1.030)
pH: 5 (ref 5.0–8.0)

## 2024-06-26 LAB — CBG MONITORING, ED: Glucose-Capillary: 88 mg/dL (ref 70–99)

## 2024-06-26 LAB — TROPONIN T, HIGH SENSITIVITY
Troponin T High Sensitivity: <15 ng/L (ref 0–19)
Troponin T High Sensitivity: <15 ng/L (ref 0–19)

## 2024-06-26 MED ORDER — ONDANSETRON 4 MG PO TBDP
4.0000 mg | ORAL_TABLET | Freq: Once | ORAL | Status: AC
  Administered 2024-06-26: 4 mg via ORAL
  Filled 2024-06-26: qty 1

## 2024-06-26 MED ORDER — ONDANSETRON 8 MG PO TBDP: 8.0000 mg | ORAL_TABLET | Freq: Three times a day (TID) | ORAL | 0 refills | Status: AC | PRN

## 2024-06-26 MED ORDER — ONDANSETRON 8 MG PO TBDP
8.0000 mg | ORAL_TABLET | Freq: Once | ORAL | Status: AC
  Administered 2024-06-26: 8 mg via ORAL
  Filled 2024-06-26: qty 1

## 2024-06-26 NOTE — Discharge Instructions (Signed)
 It was our pleasure to provide your ER care today - we hope that you feel better.  Rest. Drink plenty of fluids/stay well hydrated. Take zofran  as need for nausea.   Follow up closely with primary care doctor in the next 2-3 days if symptoms fail to improve/resolve.  Return to ER right away if worse, new symptoms, high fevers, persistent vomiting, severe abdominal pain, chest pain, trouble breathing, or other concern.

## 2024-06-26 NOTE — ED Notes (Signed)
Pt tolerates PO.

## 2024-06-26 NOTE — ED Notes (Signed)
Lab to add on trop.  

## 2024-06-26 NOTE — ED Triage Notes (Signed)
 Pt reports feeling weak, anxious & nauseous x 1 hr ago. Pt reports she has been under a lot of stress recently. Pt denies any pain anywhere. Stroke screen neg in triage.

## 2024-06-26 NOTE — ED Provider Notes (Signed)
 " [place] EMERGENCY DEPARTMENT AT [facility] Provider Note   CSN: [id number] Arrival date & time: 06/26/24  [id number]     Patient presents with: Anxiety, Nausea, and Weakness   Linda Wang is a 73 y.o. female.   Pt c/o feeling nauseated in the past few hours. No vomiting. Denies abd pain or distension. Has had normal appetite, and has been having normal bms. Denies any current or recent chest pain or discomfort. No sob or unusual doe. No dysuria or gu c/o. No known ill contacts or bad food ingestion. No fever or chills. No headaches. No cough, sore throat or uri symptoms. No extremity pain or swelling. No recent change in meds.  Pt indicates had similar symptoms a year ago or so and was told her potassium level was low then.   The history is provided by the patient, medical records and a relative.  Anxiety Pertinent negatives include no chest pain, no abdominal pain, no headaches and no shortness of breath.  Weakness Associated symptoms: nausea   Associated symptoms: no abdominal pain, no chest pain, no cough, no diarrhea, no dysuria, no fever, no headaches, no shortness of breath and no vomiting        Prior to Admission medications  Medication Sig Start Date End Date Taking? Authorizing Provider  ondansetron  (ZOFRAN -ODT) 8 MG disintegrating tablet Take 1 tablet (8 mg total) by mouth every 8 (eight) hours as needed for nausea or vomiting. 06/26/24  Yes Bernard Drivers, MD  albuterol  (PROVENTIL  HFA) 108 (90 BASE) MCG/ACT inhaler Inhale 2 puffs into the lungs 2 (two) times daily. Not using Patient not taking: Reported on 09/16/2023    [provider]  ALPRAZolam  (XANAX ) 1 MG tablet Take 1 mg by mouth 3 (three) times daily as needed. for anxiety 04/06/24   [provider]  Ascorbic Acid (VITAMIN C GUMMIE PO) Take 500 mg by mouth daily.    [provider]  Calcium Carbonate (CALCIUM 600 PO) Take 1 tablet by mouth daily.    [provider]   carvedilol  (COREG ) 6.25 MG tablet Take 6.25 mg by mouth 2 (two) times daily with a meal.    [provider]  Cholecalciferol (VITAMIN D-3) 125 MCG (5000 UT) TABS Take 2 tablets by mouth daily.    [provider]  cycloSPORINE  (RESTASIS ) 0.05 % ophthalmic emulsion 1 drop 2 (two) times daily.    [provider]  estradiol  (ESTRACE ) 1 MG tablet Take 1 mg by mouth daily.     [provider]  ezetimibe (ZETIA) 10 MG tablet Take by mouth. 01/30/19 04/30/24  [provider]  levothyroxine  (SYNTHROID , LEVOTHROID) 88 MCG tablet Take 88 mcg by mouth daily before breakfast.    [provider]  Omega-3 1000 MG CAPS Take 1 capsule by mouth daily. 03/17/20   [provider]  sodium chloride  (OCEAN) 0.65 % SOLN nasal spray Place 1 spray into both nostrils as needed for congestion.    [provider]  valACYclovir (VALTREX) 500 MG tablet Take 500 mg by mouth daily.    [provider]  vitamin E  400 UNIT capsule Take 800 Units by mouth daily. Patient not taking: No sig reported    [provider]    Allergies: Other, Sulfonamide derivatives, Morphine  and codeine, Tramadol , Hydrochlorothiazide-triamterene, Hydrocodone -acetaminophen , Nitroglycerin, Rosuvastatin calcium, and Statins    Review of Systems  Constitutional:  Negative for chills and fever.  HENT:  Negative for sinus pressure, sinus pain and sore throat.  Eyes:  Negative for redness and visual disturbance.  Respiratory:  Negative for cough and shortness of breath.   Cardiovascular:  Negative for chest pain, palpitations and leg swelling.  Gastrointestinal:  Positive for nausea. Negative for abdominal pain, constipation, diarrhea and vomiting.  Genitourinary:  Negative for dysuria and flank pain.  Musculoskeletal:  Negative for back pain and neck pain.  Skin:  Negative for rash.  Neurological:  Negative for syncope, speech difficulty, weakness, numbness and  headaches.  Psychiatric/Behavioral:  Negative for confusion.     Updated Vital Signs BP 137/81   Pulse 62   Temp 97.6 F (36.4 C) (Oral)   Resp 16   SpO2 99%   Physical Exam Vitals and nursing note reviewed.  Constitutional:      Appearance: Normal appearance. She is well-developed.  HENT:     Head: Atraumatic.     Nose: Nose normal. No congestion or rhinorrhea.     Mouth/Throat:     Mouth: Mucous membranes are moist.     Pharynx: Oropharynx is clear.  Eyes:     General: No scleral icterus.    Conjunctiva/sclera: Conjunctivae normal.     Pupils: Pupils are equal, round, and reactive to light.  Neck:     Vascular: No carotid bruit.     Trachea: No tracheal deviation.     Comments: Trachea midline. Thyroid  not grossly enlarged or tender. No neck stiffness or rigidity.  Cardiovascular:     Rate and Rhythm: Normal rate and regular rhythm.     Pulses: Normal pulses.     Heart sounds: Normal heart sounds. No murmur heard.    No friction rub. No gallop.  Pulmonary:     Effort: Pulmonary effort is normal. No respiratory distress.     Breath sounds: Normal breath sounds.  Abdominal:     General: Bowel sounds are normal. There is no distension.     Palpations: Abdomen is soft. There is no mass.     Tenderness: There is no abdominal tenderness. There is no guarding.  Genitourinary:    Comments: No cva tenderness.  Musculoskeletal:        General: No swelling or tenderness.     Cervical back: Normal range of motion and neck supple. No rigidity or tenderness. No muscular tenderness.     Right lower leg: No edema.     Left lower leg: No edema.  Skin:    General: Skin is warm and dry.     Findings: No rash.  Neurological:     Mental Status: She is alert.     Comments: Alert, speech normal. Motor/sens grossly intact bil. Stre 5/5. Sens grossly intact. No pronator drift. Steady gait.   Psychiatric:        Mood and Affect: Mood normal.     (all labs ordered are listed, but  only abnormal results are displayed) Results for orders placed or performed during the hospital encounter of 06/26/24  CBG monitoring, ED   Collection Time: 06/26/24  4:16 PM  Result Value Ref Range   Glucose-Capillary 88 70 - 99 mg/dL  Comprehensive metabolic panel   Collection Time: 06/26/24  4:25 PM  Result Value Ref Range   Sodium 138 135 - 145 mmol/L   Potassium 3.8 3.5 - 5.1 mmol/L   Chloride 103 98 - 111 mmol/L   CO2 27 22 - 32 mmol/L   Glucose, Bld 95 70 - 99 mg/dL   BUN 14 8 - 23 mg/dL   Creatinine, Ser  0.63 0.44 - 1.00 mg/dL   Calcium 9.3 8.9 - 89.6 mg/dL   Total Protein 8.3 (H) 6.5 - 8.1 g/dL   Albumin 3.9 3.5 - 5.0 g/dL   AST 18 15 - 41 U/L   ALT 10 0 - 44 U/L   Alkaline Phosphatase 41 38 - 126 U/L   Total Bilirubin 0.3 0.0 - 1.2 mg/dL   GFR, Estimated >39 >39 mL/min   Anion gap 8 5 - 15  CBC   Collection Time: 06/26/24  4:25 PM  Result Value Ref Range   WBC 5.8 4.0 - 10.5 K/uL   RBC 4.17 3.87 - 5.11 MIL/uL   Hemoglobin 12.8 12.0 - 15.0 g/dL   HCT 61.3 63.9 - 53.9 %   MCV 92.6 80.0 - 100.0 fL   MCH 30.7 26.0 - 34.0 pg   MCHC 33.2 30.0 - 36.0 g/dL   RDW 87.6 88.4 - 84.4 %   Platelets 298 150 - 400 K/uL   nRBC 0.0 0.0 - 0.2 %  Troponin T, High Sensitivity   Collection Time: 06/26/24  4:25 PM  Result Value Ref Range   Troponin T High Sensitivity <15 0 - 19 ng/L  Urinalysis, Routine w reflex microscopic -Urine, Clean Catch   Collection Time: 06/26/24  5:44 PM  Result Value Ref Range   Color, Urine YELLOW YELLOW   APPearance HAZY (A) CLEAR   Specific Gravity, Urine 1.021 1.005 - 1.030   pH 5.0 5.0 - 8.0   Glucose, UA NEGATIVE NEGATIVE mg/dL   Hgb urine dipstick NEGATIVE NEGATIVE   Bilirubin Urine NEGATIVE NEGATIVE   Ketones, ur NEGATIVE NEGATIVE mg/dL   Protein, ur NEGATIVE NEGATIVE mg/dL   Nitrite NEGATIVE NEGATIVE   Leukocytes,Ua NEGATIVE NEGATIVE  Troponin T, High Sensitivity   Collection Time: 06/26/24  6:50 PM  Result Value Ref Range   Troponin T  High Sensitivity <15 0 - 19 ng/L      EKG: EKG Interpretation Date/Time:  Tuesday June 26 2024 16:13:32 EST Ventricular Rate:  62 PR Interval:  164 QRS Duration:  89 QT Interval:  436 QTC Calculation: 443 R Axis:   88  Text Interpretation: Sinus rhythm Nonspecific ST abnormality Confirmed by Bernard Drivers ([id number]) on 06/26/2024 4:34:49 PM  Radiology: No results found.   Procedures   Medications Ordered in the ED  ondansetron  (ZOFRAN -ODT) disintegrating tablet 4 mg (4 mg Oral Given 06/26/24 1618)  ondansetron  (ZOFRAN -ODT) disintegrating tablet 8 mg (8 mg Oral Given 06/26/24 1841)                                    Medical Decision Making Problems Addressed: Nausea: acute illness or injury with systemic symptoms  Amount and/or Complexity of Data Reviewed Independent Historian:     Details: Family, hx External Data Reviewed: notes. Labs: ordered. Decision-making details documented in ED Course. ECG/medicine tests: ordered and independent interpretation performed. Decision-making details documented in ED Course.  Risk Prescription drug management. Decision regarding hospitalization.   Iv ns. Continuous pulse ox and cardiac monitoring. Labs ordered/sent.  Differential diagnosis includes nausea, gi illness, hypokalemia, etc. Dispo decision including potential need for admission considered - will get labs and reassess.   Reviewed nursing notes and prior charts for additional history. External reports reviewed. Additional history from: family.   Cardiac monitor: sinus rhythm, rate 66.  Labs reviewed/interpreted by me - chem normal. Wbc and hgb normal. Trop normal. Ua neg.  Additional labs reviewed/interpreted by me - delta trop normal, not increasing - felt not c/w acs.   Zofran  po. Po fluids. No emesis. Abd soft nt. Denies pain of any sort, no headache, no chest pain/discomfort, no abd pain.   Pt currently appears stable for ed d/c.   Rec close pcp  f/u.  Return precautions provided.       Final diagnoses:  Nausea    ED Discharge Orders          Ordered    ondansetron  (ZOFRAN -ODT) 8 MG disintegrating tablet  Every 8 hours PRN        06/26/24 1932               Bernard Drivers, MD 06/26/24 1934  "

## 2024-07-23 ENCOUNTER — Encounter (HOSPITAL_COMMUNITY): Payer: Self-pay

## 2024-07-23 ENCOUNTER — Other Ambulatory Visit: Payer: Self-pay

## 2024-07-23 ENCOUNTER — Emergency Department (HOSPITAL_COMMUNITY)

## 2024-07-23 ENCOUNTER — Emergency Department (HOSPITAL_COMMUNITY)
Admission: EM | Admit: 2024-07-23 | Discharge: 2024-07-23 | Disposition: A | Discharge status: Home / Self Care | Attending: Emergency Medicine | Admitting: Emergency Medicine

## 2024-07-23 DIAGNOSIS — Y92410 Unspecified street and highway as the place of occurrence of the external cause: Secondary | ICD-10-CM | POA: Diagnosis not present

## 2024-07-23 DIAGNOSIS — I10 Essential (primary) hypertension: Secondary | ICD-10-CM | POA: Diagnosis not present

## 2024-07-23 DIAGNOSIS — S20219A Contusion of unspecified front wall of thorax, initial encounter: Secondary | ICD-10-CM | POA: Insufficient documentation

## 2024-07-23 DIAGNOSIS — S8011XA Contusion of right lower leg, initial encounter: Secondary | ICD-10-CM | POA: Insufficient documentation

## 2024-07-23 DIAGNOSIS — S299XXA Unspecified injury of thorax, initial encounter: Secondary | ICD-10-CM | POA: Diagnosis present

## 2024-07-23 DIAGNOSIS — Z79899 Other long term (current) drug therapy: Secondary | ICD-10-CM | POA: Diagnosis not present
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
